# Patient Record
Sex: Male | Born: 1960 | Race: Black or African American | Hispanic: No | Marital: Single | Smoking: Former smoker
Health system: Southern US, Community
[De-identification: ages and names within clinical notes are randomized; demographics above are authoritative.]

## PROBLEM LIST (undated history)

## (undated) DIAGNOSIS — I639 Cerebral infarction, unspecified: Secondary | ICD-10-CM

## (undated) HISTORY — PX: NO PAST SURGERIES: SHX2092

---

## 2014-09-03 ENCOUNTER — Encounter (HOSPITAL_COMMUNITY): Payer: Self-pay | Admitting: Emergency Medicine

## 2014-09-03 ENCOUNTER — Emergency Department (HOSPITAL_COMMUNITY)
Admission: EM | Admit: 2014-09-03 | Discharge: 2014-09-03 | Disposition: A | Discharge status: Home / Self Care | Payer: Self-pay | Attending: Emergency Medicine | Admitting: Emergency Medicine

## 2014-09-03 ENCOUNTER — Emergency Department (HOSPITAL_COMMUNITY): Payer: Self-pay

## 2014-09-03 DIAGNOSIS — S99912A Unspecified injury of left ankle, initial encounter: Secondary | ICD-10-CM

## 2014-09-03 DIAGNOSIS — W010XXA Fall on same level from slipping, tripping and stumbling without subsequent striking against object, initial encounter: Secondary | ICD-10-CM | POA: Insufficient documentation

## 2014-09-03 DIAGNOSIS — S82402A Unspecified fracture of shaft of left fibula, initial encounter for closed fracture: Secondary | ICD-10-CM

## 2014-09-03 DIAGNOSIS — Z87891 Personal history of nicotine dependence: Secondary | ICD-10-CM | POA: Insufficient documentation

## 2014-09-03 DIAGNOSIS — Y9289 Other specified places as the place of occurrence of the external cause: Secondary | ICD-10-CM | POA: Insufficient documentation

## 2014-09-03 DIAGNOSIS — Y9301 Activity, walking, marching and hiking: Secondary | ICD-10-CM | POA: Insufficient documentation

## 2014-09-03 DIAGNOSIS — S82432A Displaced oblique fracture of shaft of left fibula, initial encounter for closed fracture: Secondary | ICD-10-CM | POA: Insufficient documentation

## 2014-09-03 MED ORDER — HYDROCODONE-ACETAMINOPHEN 5-325 MG PO TABS: 1.0000 | ORAL_TABLET | ORAL | Status: DC | PRN

## 2014-09-03 NOTE — ED Notes (Signed)
Waiting for ortho to complete splint/crutches.

## 2014-09-03 NOTE — ED Notes (Signed)
Patient states fell over something on his deck on Sunday and twisted his ankle.   Patient states swelling and pain.   Patient complains of pain,

## 2014-09-03 NOTE — Progress Notes (Signed)
Orthopedic Tech Progress Note Patient Details:  Naida SleightStephen Delaguila 05/15/1961 [id number]  Ortho Devices Type of Ortho Device: Crutches;Ace wrap;Post (short leg) splint;Stirrup splint Ortho Device/Splint Interventions: Application   Cammer, Mickie BailJennifer Carol 09/03/2014, 12:35 PM

## 2014-09-03 NOTE — Discharge Planning (Signed)
[facility] Community Health & Eligibility Specialist  Spoke to the patient regarding primary care resources and the [facility]. Orange card application and instructions provided. Resource guide and my contact information given for any future questions or concerns. No other Community Health & Eligibility Specialist needs identified at this time.

## 2014-09-03 NOTE — Discharge Instructions (Signed)
Cast or Splint Care °Casts and splints support injured limbs and keep bones from moving while they heal. It is important to care for your cast or splint at home.   °HOME CARE INSTRUCTIONS °· Keep the cast or splint uncovered during the drying period. It can take 24 to 48 hours to dry if it is made of plaster. A fiberglass cast will dry in less than 1 hour. °· Do not rest the cast on anything harder than a pillow for the first 24 hours. °· Do not put weight on your injured limb or apply pressure to the cast until your health care provider gives you permission. °· Keep the cast or splint dry. Wet casts or splints can lose their shape and may not support the limb as well. A wet cast that has lost its shape can also create harmful pressure on your skin when it dries. Also, wet skin can become infected. °¨ Cover the cast or splint with a plastic bag when bathing or when out in the rain or snow. If the cast is on the trunk of the body, take sponge baths until the cast is removed. °¨ If your cast does become wet, dry it with a towel or a blow dryer on the cool setting only. °· Keep your cast or splint clean. Soiled casts may be wiped with a moistened cloth. °· Do not place any hard or soft foreign objects under your cast or splint, such as cotton, toilet paper, lotion, or powder. °· Do not try to scratch the skin under the cast with any object. The object could get stuck inside the cast. Also, scratching could lead to an infection. If itching is a problem, use a blow dryer on a cool setting to relieve discomfort. °· Do not trim or cut your cast or remove padding from inside of it. °· Exercise all joints next to the injury that are not immobilized by the cast or splint. For example, if you have a long leg cast, exercise the hip joint and toes. If you have an arm cast or splint, exercise the shoulder, elbow, thumb, and fingers. °· Elevate your injured arm or leg on 1 or 2 pillows for the first 1 to 3 days to decrease  swelling and pain. It is best if you can comfortably elevate your cast so it is higher than your heart. °SEEK MEDICAL CARE IF:  °· Your cast or splint cracks. °· Your cast or splint is too tight or too loose. °· You have unbearable itching inside the cast. °· Your cast becomes wet or develops a soft spot or area. °· You have a bad smell coming from inside your cast. °· You get an object stuck under your cast. °· Your skin around the cast becomes red or raw. °· You have new pain or worsening pain after the cast has been applied. °SEEK IMMEDIATE MEDICAL CARE IF:  °· You have fluid leaking through the cast. °· You are unable to move your fingers or toes. °· You have discolored (blue or white), cool, painful, or very swollen fingers or toes beyond the cast. °· You have tingling or numbness around the injured area. °· You have severe pain or pressure under the cast. °· You have any difficulty with your breathing or have shortness of breath. °· You have chest pain. °Document Released: 11/05/2000 Document Revised: 08/29/2013 Document Reviewed: 05/17/2013 °ExitCare® Patient Information ©2015 [employer]. This information is not intended to replace advice given to you by your health care   provider. Make sure you discuss any questions you have with your health care provider. Tibial and Fibular Fracture, Adult Tibial and fibular fracture is a break in the bones of your lower leg (tibia and fibula). The tibia is the larger of these two bones. The fibula is the smaller of the two bones. It is on the outer side of your leg.  CAUSES  Low-energy injuries, such as a fall from ground level.  High-energy injuries, such as motor vehicle injuries, gunshot wounds, or high-speed sports collisions. RISK FACTORS  Jumping activities.  Repetitive stress, such as long-distance running.  Participation in sports.  Osteoporosis.  Advanced age. SIGNS AND SYMPTOMS  Pain.  Swelling.  Inability to put weight on your injured  leg.  Bone deformities at the site of your injury.  Bruising. DIAGNOSIS  Tibial and fibular fractures are diagnosed with the use of X-ray exams. TREATMENT  If you have a simple fracture of these two bones, they can be treated with simple immobilization. A cast or splint will be used on your leg to keep it from moving while it heals. Then you can begin range-of-motion exercises to regain your knee motion. HOME CARE INSTRUCTIONS   Apply ice to your leg:  Put ice in a plastic bag.  Place a towel between your skin and the bag.  Leave the ice on for 20 minutes, 2-3 times a day.  If you have a plaster or fiberglass cast:  Do not try to scratch the skin under the cast using sharp or pointed objects.  Check the skin around the cast every day. You may put lotion on any red or sore areas.  Keep your cast dry and clean.  If you have a plaster splint:  Wear the splint as directed.  You may loosen the elastic around the splint if your toes become numb, tingle, or turn cold or blue.  Do not put pressure on any part of your cast or splint until it is fully hardened, because it may deform.  Your cast or splint can be protected during bathing with a plastic bag. Do not lower the cast or splint into water.  Use crutches as directed.  Only take over-the-counter or prescription medicines for pain, discomfort, or fever as directed by your health care provider.  Follow all instructions given to you by your health care provider.  Make and keep all follow-up appointments. SEEK MEDICAL CARE IF:  Your pain is becoming worse rather than better or is not controlled with medicines.  You have increased swelling or redness in the foot.  You begin to lose feeling in your foot or toes. SEEK IMMEDIATE MEDICAL CARE IF:  You develop a cold or blue foot or toes on the injured side.  You develop severe pain in your injured leg, especially if the pain is increased with movement of your toes. MAKE  SURE YOU:  Understand these instructions.  Will watch your condition.  Will get help right away if you are not doing well or get worse. Document Released: 07/31/2002 Document Revised: 08/29/2013 Document Reviewed: 06/20/2013 [facility] Patient Information 2015 [place]. This information is not intended to replace advice given to you by your health care provider. Make sure you discuss any questions you have with your health care provider. Cryotherapy Cryotherapy means treatment with cold. Ice or gel packs can be used to reduce both pain and swelling. Ice is the most helpful within the first 24 to 48 hours after an injury or flare-up from overusing  a muscle or joint. Sprains, strains, spasms, burning pain, shooting pain, and aches can all be eased with ice. Ice can also be used when recovering from surgery. Ice is effective, has very few side effects, and is safe for most people to use. PRECAUTIONS  Ice is not a safe treatment option for people with:  Raynaud phenomenon. This is a condition affecting small blood vessels in the extremities. Exposure to cold may cause your problems to return.  Cold hypersensitivity. There are many forms of cold hypersensitivity, including:  Cold urticaria. Red, itchy hives appear on the skin when the tissues begin to warm after being iced.  Cold erythema. This is a red, itchy rash caused by exposure to cold.  Cold hemoglobinuria. Red blood cells break down when the tissues begin to warm after being iced. The hemoglobin that carry oxygen are passed into the urine because they cannot combine with blood proteins fast enough.  Numbness or altered sensitivity in the area being iced. If you have any of the following conditions, do not use ice until you have discussed cryotherapy with your caregiver:  Heart conditions, such as arrhythmia, angina, or chronic heart disease.  High blood pressure.  Healing wounds or open skin in the area being iced.  Current  infections.  Rheumatoid arthritis.  Poor circulation.  Diabetes. Ice slows the blood flow in the region it is applied. This is beneficial when trying to stop inflamed tissues from spreading irritating chemicals to surrounding tissues. However, if you expose your skin to cold temperatures for too long or without the proper protection, you can damage your skin or nerves. Watch for signs of skin damage due to cold. HOME CARE INSTRUCTIONS Follow these tips to use ice and cold packs safely.  Place a dry or damp towel between the ice and skin. A damp towel will cool the skin more quickly, so you may need to shorten the time that the ice is used.  For a more rapid response, add gentle compression to the ice.  Ice for no more than 10 to 20 minutes at a time. The bonier the area you are icing, the less time it will take to get the benefits of ice.  Check your skin after 5 minutes to make sure there are no signs of a poor response to cold or skin damage.  Rest 20 minutes or more between uses.  Once your skin is numb, you can end your treatment. You can test numbness by very lightly touching your skin. The touch should be so light that you do not see the skin dimple from the pressure of your fingertip. When using ice, most people will feel these normal sensations in this order: cold, burning, aching, and numbness.  Do not use ice on someone who cannot communicate their responses to pain, such as small children or people with dementia. HOW TO MAKE AN ICE PACK Ice packs are the most common way to use ice therapy. Other methods include ice massage, ice baths, and cryosprays. Muscle creams that cause a cold, tingly feeling do not offer the same benefits that ice offers and should not be used as a substitute unless recommended by your caregiver. To make an ice pack, do one of the following:  Place crushed ice or a bag of frozen vegetables in a sealable plastic bag. Squeeze out the excess air. Place this  bag inside another plastic bag. Slide the bag into a pillowcase or place a damp towel between your skin and the  bag.  Mix 3 parts water with 1 part rubbing alcohol. Freeze the mixture in a sealable plastic bag. When you remove the mixture from the freezer, it will be slushy. Squeeze out the excess air. Place this bag inside another plastic bag. Slide the bag into a pillowcase or place a damp towel between your skin and the bag. SEEK MEDICAL CARE IF:  You develop white spots on your skin. This may give the skin a blotchy (mottled) appearance.  Your skin turns blue or pale.  Your skin becomes waxy or hard.  Your swelling gets worse. MAKE SURE YOU:   Understand these instructions.  Will watch your condition.  Will get help right away if you are not doing well or get worse. Document Released: 07/05/2011 Document Revised: 03/25/2014 Document Reviewed: 07/05/2011 [facility] Patient Information 2015 [place], [place]. This information is not intended to replace advice given to you by your health care provider. Make sure you discuss any questions you have with your health care provider.

## 2014-09-03 NOTE — ED Notes (Signed)
Ortho at bedside with patient

## 2014-09-03 NOTE — ED Provider Notes (Signed)
CSN: [id number]     Arrival date & time 09/03/14  1024 History  This chart was scribed for non-physician practitioner, Arnoldo HookerShari A Kaimani Clayson, PA-C, working with Lyanne CoKevin M Campos, MD by Charline BillsEssence Howell, ED Scribe. This patient was seen in room TR05C/TR05C and the patient's care was started at 11:04 AM.   Chief Complaint  Patient presents with  . Ankle Injury   The history is provided by the patient. No language interpreter was used.   HPI Comments: Cory SleightStephen [name] is a 53 y.o. male, with no pertinent medical history, who presents to the Emergency Department complaining of L lower leg pain and L ankle pain with associated swelling onset 2 days ago. Pt states that he was walking down a wet ramp when he slipped and fell. He states that he landed on his L leg which slide to the side upon falling. Pt denies pain at this time or with sitting, but reports that pain is exacerbated with walking and lying in bed. He describes the pain as pressure. He denies fever, chills, SOB, chest pain, leg swelling. Pt has tried soaking is ankle in Epson salt, alcohol and warm water without relief. He has also tried ice, elevation and ibuprofen with mild relief. No h/o previous injury or surgery.  History reviewed. No pertinent past medical history. History reviewed. No pertinent past surgical history. No family history on file. History  Substance Use Topics  . Smoking status: Former Games developermoker  . Smokeless tobacco: Not on file  . Alcohol Use: Yes    Review of Systems  Constitutional: Negative for fever and chills.  Respiratory: Negative for shortness of breath.   Cardiovascular: Negative for chest pain.  Musculoskeletal: Positive for arthralgias and joint swelling.   Allergies  Review of patient's allergies indicates no known allergies.  Home Medications   Prior to Admission medications   Medication Sig Start Date End Date Taking? Authorizing Provider  ibuprofen (ADVIL,MOTRIN) 200 MG tablet Take 200 mg by mouth  every 6 (six) hours as needed for mild pain.   Yes Historical Provider, MD   Triage Vitals: BP 153/94  Pulse 101  Temp(Src) 98.6 F (37 C) (Oral)  Resp 16  Ht 5\' 6"  (1.676 m)  Wt 190 lb (86.183 kg)  BMI 30.68 kg/m2  SpO2 100% Physical Exam  Nursing note and vitals reviewed. Constitutional: He is oriented to person, place, and time. He appears well-developed and well-nourished.  HENT:  Head: Normocephalic and atraumatic.  Eyes: Conjunctivae and EOM are normal.  Neck: Neck supple.  Cardiovascular: Normal rate and regular rhythm.   Pulmonary/Chest: Effort normal and breath sounds normal. He has no wheezes. He has no rales.  Musculoskeletal: Normal range of motion.  L ankle: Edema medial and lateral malleolous  Tender to palpation LLE: Edema of dorsum of foot No open wounds, erythema, warmth  Full ROM Strength and sensation intact bilaterally   Neurological: He is alert and oriented to person, place, and time.  Skin: Skin is warm and dry.  Psychiatric: He has a normal mood and affect. His behavior is normal.   ED Course  Procedures (including critical care time) DIAGNOSTIC STUDIES: Oxygen Saturation is 100% on RA, normal by my interpretation.    COORDINATION OF CARE: 11:15 AM-Discussed treatment plan which includes XR with pt at bedside and pt agreed to plan.   Labs Review Labs Reviewed - No data to display  Imaging Review Dg Ankle Complete Left  09/03/2014   CLINICAL DATA:  Ankle injury 2 days ago,  pain and swelling lateral malleolus left ankle  EXAM: LEFT ANKLE COMPLETE - 3+ VIEW  COMPARISON:  None.  FINDINGS: Three views of the left ankle submitted. There is mild displaced oblique fracture in distal left fibula with adjacent soft tissue swelling. Minimal disruption of ankle mortise with mild widening of medial tibiotalar space.  IMPRESSION: There is mild displaced oblique fracture in distal left fibula with adjacent soft tissue swelling. Minimal disruption of ankle  mortise with mild widening of medial tibiotalar space.   Electronically Signed   By: Natasha MeadLiviu  Pop M.D.   On: 09/03/2014 10:58    EKG Interpretation None      MDM   Final diagnoses:  None    1. Fibula fracture 2. Ankle injury  Posterior splint w/stirrup applied, crutches for non-weight bearing status given ankle mortise instability. Pain management provided with referral to orthopedics.  I personally performed the services described in this documentation, which was scribed in my presence. The recorded information has been reviewed and is accurate.    Arnoldo HookerShari A Amaal Dimartino, PA-C 09/03/14 1202

## 2014-09-04 NOTE — ED Provider Notes (Signed)
Medical screening examination/treatment/procedure(s) were performed by non-physician practitioner and as supervising physician I was immediately available for consultation/collaboration.   EKG Interpretation None        Ephrata Verville M Jennilee Demarco, MD 09/04/14 1936 

## 2014-10-15 ENCOUNTER — Inpatient Hospital Stay (HOSPITAL_COMMUNITY)
Admission: EM | Admit: 2014-10-15 | Discharge: 2014-10-18 | DRG: 065 | Disposition: A | Discharge status: Rehabilitation Facility | Payer: Medicaid Other | Attending: Internal Medicine | Admitting: Internal Medicine

## 2014-10-15 ENCOUNTER — Emergency Department (HOSPITAL_COMMUNITY): Payer: Medicaid Other

## 2014-10-15 ENCOUNTER — Inpatient Hospital Stay (HOSPITAL_COMMUNITY): Payer: Medicaid Other

## 2014-10-15 ENCOUNTER — Encounter (HOSPITAL_COMMUNITY): Payer: Self-pay | Admitting: Emergency Medicine

## 2014-10-15 DIAGNOSIS — Z87891 Personal history of nicotine dependence: Secondary | ICD-10-CM

## 2014-10-15 DIAGNOSIS — R29810 Facial weakness: Secondary | ICD-10-CM | POA: Diagnosis present

## 2014-10-15 DIAGNOSIS — E785 Hyperlipidemia, unspecified: Secondary | ICD-10-CM | POA: Insufficient documentation

## 2014-10-15 DIAGNOSIS — E875 Hyperkalemia: Secondary | ICD-10-CM | POA: Diagnosis present

## 2014-10-15 DIAGNOSIS — F1020 Alcohol dependence, uncomplicated: Secondary | ICD-10-CM | POA: Diagnosis present

## 2014-10-15 DIAGNOSIS — I639 Cerebral infarction, unspecified: Secondary | ICD-10-CM | POA: Diagnosis present

## 2014-10-15 DIAGNOSIS — Z823 Family history of stroke: Secondary | ICD-10-CM | POA: Diagnosis not present

## 2014-10-15 DIAGNOSIS — I63419 Cerebral infarction due to embolism of unspecified middle cerebral artery: Principal | ICD-10-CM | POA: Diagnosis present

## 2014-10-15 DIAGNOSIS — R1312 Dysphagia, oropharyngeal phase: Secondary | ICD-10-CM | POA: Diagnosis present

## 2014-10-15 DIAGNOSIS — G8194 Hemiplegia, unspecified affecting left nondominant side: Secondary | ICD-10-CM | POA: Diagnosis present

## 2014-10-15 DIAGNOSIS — I10 Essential (primary) hypertension: Secondary | ICD-10-CM | POA: Diagnosis present

## 2014-10-15 DIAGNOSIS — Z9119 Patient's noncompliance with other medical treatment and regimen: Secondary | ICD-10-CM | POA: Diagnosis present

## 2014-10-15 DIAGNOSIS — R4781 Slurred speech: Secondary | ICD-10-CM | POA: Diagnosis present

## 2014-10-15 DIAGNOSIS — I517 Cardiomegaly: Secondary | ICD-10-CM

## 2014-10-15 LAB — I-STAT CHEM 8, ED
BUN: 10 mg/dL (ref 6–23)
CREATININE: 1.1 mg/dL (ref 0.50–1.35)
Calcium, Ion: 1.08 mmol/L — ABNORMAL LOW (ref 1.12–1.23)
Chloride: 104 mEq/L (ref 96–112)
Glucose, Bld: 118 mg/dL — ABNORMAL HIGH (ref 70–99)
HCT: 52 % (ref 39.0–52.0)
HEMOGLOBIN: 17.7 g/dL — AB (ref 13.0–17.0)
POTASSIUM: 5.5 meq/L — AB (ref 3.7–5.3)
SODIUM: 141 meq/L (ref 137–147)
TCO2: 28 mmol/L (ref 0–100)

## 2014-10-15 LAB — ETHANOL: Alcohol, Ethyl (B): 103 mg/dL — ABNORMAL HIGH (ref 0–11)

## 2014-10-15 LAB — RAPID URINE DRUG SCREEN, HOSP PERFORMED
AMPHETAMINES: NOT DETECTED
BENZODIAZEPINES: NOT DETECTED
Barbiturates: NOT DETECTED
COCAINE: NOT DETECTED
OPIATES: NOT DETECTED
TETRAHYDROCANNABINOL: NOT DETECTED

## 2014-10-15 LAB — LIPID PANEL
CHOL/HDL RATIO: 4.6 ratio
Cholesterol: 180 mg/dL (ref 0–200)
HDL: 39 mg/dL — AB (ref >39)
LDL CALC: 118 mg/dL — AB (ref 0–99)
Triglycerides: 115 mg/dL (ref <150)
VLDL: 23 mg/dL (ref 0–40)

## 2014-10-15 LAB — URINALYSIS, ROUTINE W REFLEX MICROSCOPIC
BILIRUBIN URINE: NEGATIVE
Glucose, UA: NEGATIVE mg/dL
HGB URINE DIPSTICK: NEGATIVE
KETONES UR: NEGATIVE mg/dL
Leukocytes, UA: NEGATIVE
Nitrite: NEGATIVE
Protein, ur: NEGATIVE mg/dL
Specific Gravity, Urine: 1.019 (ref 1.005–1.030)
UROBILINOGEN UA: 0.2 mg/dL (ref 0.0–1.0)
pH: 6 (ref 5.0–8.0)

## 2014-10-15 LAB — CBC
HEMATOCRIT: 47.3 % (ref 39.0–52.0)
Hemoglobin: 15.6 g/dL (ref 13.0–17.0)
MCH: 28.5 pg (ref 26.0–34.0)
MCHC: 33 g/dL (ref 30.0–36.0)
MCV: 86.5 fL (ref 78.0–100.0)
PLATELETS: 220 10*3/uL (ref 150–400)
RBC: 5.47 MIL/uL (ref 4.22–5.81)
RDW: 12.4 % (ref 11.5–15.5)
WBC: 5.1 10*3/uL (ref 4.0–10.5)

## 2014-10-15 LAB — COMPREHENSIVE METABOLIC PANEL
ALBUMIN: 3.6 g/dL (ref 3.5–5.2)
ALT: 13 U/L (ref 0–53)
AST: 24 U/L (ref 0–37)
Alkaline Phosphatase: 58 U/L (ref 39–117)
Anion gap: 14 (ref 5–15)
BUN: 8 mg/dL (ref 6–23)
CALCIUM: 9.1 mg/dL (ref 8.4–10.5)
CO2: 26 mEq/L (ref 19–32)
CREATININE: 0.93 mg/dL (ref 0.50–1.35)
Chloride: 101 mEq/L (ref 96–112)
GFR calc Af Amer: >90 mL/min (ref >90)
GFR calc non Af Amer: >90 mL/min (ref >90)
Glucose, Bld: 113 mg/dL — ABNORMAL HIGH (ref 70–99)
Potassium: 5.6 mEq/L — ABNORMAL HIGH (ref 3.7–5.3)
Sodium: 141 mEq/L (ref 137–147)
TOTAL PROTEIN: 8.5 g/dL — AB (ref 6.0–8.3)
Total Bilirubin: 0.3 mg/dL (ref 0.3–1.2)

## 2014-10-15 LAB — DIFFERENTIAL
BASOS PCT: 0 % (ref 0–1)
Basophils Absolute: 0 10*3/uL (ref 0.0–0.1)
EOS PCT: 3 % (ref 0–5)
Eosinophils Absolute: 0.2 10*3/uL (ref 0.0–0.7)
Lymphocytes Relative: 50 % — ABNORMAL HIGH (ref 12–46)
Lymphs Abs: 2.6 10*3/uL (ref 0.7–4.0)
MONO ABS: 0.4 10*3/uL (ref 0.1–1.0)
Monocytes Relative: 8 % (ref 3–12)
Neutro Abs: 2 10*3/uL (ref 1.7–7.7)
Neutrophils Relative %: 39 % — ABNORMAL LOW (ref 43–77)

## 2014-10-15 LAB — I-STAT TROPONIN, ED: TROPONIN I, POC: 0 ng/mL (ref 0.00–0.08)

## 2014-10-15 LAB — SEDIMENTATION RATE: SED RATE: 20 mm/h — AB (ref 0–16)

## 2014-10-15 LAB — APTT: APTT: 28 s (ref 24–37)

## 2014-10-15 LAB — GLUCOSE, CAPILLARY: Glucose-Capillary: 97 mg/dL (ref 70–99)

## 2014-10-15 LAB — CBG MONITORING, ED: Glucose-Capillary: 97 mg/dL (ref 70–99)

## 2014-10-15 LAB — HEMOGLOBIN A1C
Hgb A1c MFr Bld: 6.1 % — ABNORMAL HIGH (ref <5.7)
Mean Plasma Glucose: 128 mg/dL — ABNORMAL HIGH (ref <117)

## 2014-10-15 LAB — TSH: TSH: 0.641 u[IU]/mL (ref 0.350–4.500)

## 2014-10-15 LAB — PROTIME-INR
INR: 1.03 (ref 0.00–1.49)
Prothrombin Time: 13.7 seconds (ref 11.6–15.2)

## 2014-10-15 MED ORDER — LORAZEPAM 2 MG/ML IJ SOLN
INTRAMUSCULAR | Status: AC
  Administered 2014-10-15: 2 mg via INTRAVENOUS
  Filled 2014-10-15: qty 1

## 2014-10-15 MED ORDER — ASPIRIN 325 MG PO TABS
325.0000 mg | ORAL_TABLET | Freq: Every day | ORAL | Status: DC
  Administered 2014-10-17 – 2014-10-18 (×2): 325 mg via ORAL
  Filled 2014-10-15 (×2): qty 1

## 2014-10-15 MED ORDER — SENNOSIDES-DOCUSATE SODIUM 8.6-50 MG PO TABS: 1.0000 | ORAL_TABLET | Freq: Every evening | ORAL | Status: DC | PRN

## 2014-10-15 MED ORDER — CETYLPYRIDINIUM CHLORIDE 0.05 % MT LIQD
7.0000 mL | Freq: Two times a day (BID) | OROMUCOSAL | Status: DC
  Administered 2014-10-15 – 2014-10-18 (×6): 7 mL via OROMUCOSAL

## 2014-10-15 MED ORDER — LORAZEPAM 2 MG/ML IJ SOLN
0.0000 mg | Freq: Two times a day (BID) | INTRAMUSCULAR | Status: DC
  Administered 2014-10-18: 2 mg via INTRAVENOUS
  Filled 2014-10-15 (×2): qty 1

## 2014-10-15 MED ORDER — ASPIRIN 300 MG RE SUPP
300.0000 mg | Freq: Every day | RECTAL | Status: DC
  Administered 2014-10-15 – 2014-10-16 (×2): 300 mg via RECTAL
  Filled 2014-10-15 (×2): qty 1

## 2014-10-15 MED ORDER — FENTANYL CITRATE 0.05 MG/ML IJ SOLN
50.0000 ug | Freq: Once | INTRAMUSCULAR | Status: AC
  Administered 2014-10-15: 50 ug via INTRAVENOUS
  Filled 2014-10-15: qty 2

## 2014-10-15 MED ORDER — FOLIC ACID 1 MG PO TABS
1.0000 mg | ORAL_TABLET | Freq: Every day | ORAL | Status: DC
  Administered 2014-10-16 – 2014-10-18 (×3): 1 mg via ORAL
  Filled 2014-10-15 (×3): qty 1

## 2014-10-15 MED ORDER — FLEET ENEMA 7-19 GM/118ML RE ENEM
1.0000 | ENEMA | Freq: Once | RECTAL | Status: AC
  Administered 2014-10-15: 1 via RECTAL
  Filled 2014-10-15: qty 1

## 2014-10-15 MED ORDER — SODIUM POLYSTYRENE SULFONATE 15 GM/60ML PO SUSP
15.0000 g | Freq: Once | ORAL | Status: AC
  Administered 2014-10-15: 15 g via RECTAL
  Filled 2014-10-15: qty 60

## 2014-10-15 MED ORDER — THIAMINE HCL 100 MG/ML IJ SOLN
100.0000 mg | Freq: Every day | INTRAMUSCULAR | Status: DC
  Administered 2014-10-15 – 2014-10-16 (×2): 100 mg via INTRAVENOUS
  Filled 2014-10-15 (×2): qty 2

## 2014-10-15 MED ORDER — SODIUM CHLORIDE 0.9 % IV SOLN
INTRAVENOUS | Status: AC
  Administered 2014-10-15: 03:00:00 via INTRAVENOUS

## 2014-10-15 MED ORDER — ADULT MULTIVITAMIN W/MINERALS CH
1.0000 | ORAL_TABLET | Freq: Every day | ORAL | Status: DC
  Administered 2014-10-16 – 2014-10-18 (×3): 1 via ORAL
  Filled 2014-10-15 (×3): qty 1

## 2014-10-15 MED ORDER — ENOXAPARIN SODIUM 40 MG/0.4ML ~~LOC~~ SOLN
40.0000 mg | SUBCUTANEOUS | Status: DC
  Administered 2014-10-15 – 2014-10-18 (×3): 40 mg via SUBCUTANEOUS
  Filled 2014-10-15 (×4): qty 0.4

## 2014-10-15 MED ORDER — LORAZEPAM 2 MG/ML IJ SOLN: 0.0000 mg | Freq: Four times a day (QID) | INTRAMUSCULAR | Status: AC

## 2014-10-15 MED ORDER — LORAZEPAM 2 MG/ML IJ SOLN
1.0000 mg | Freq: Once | INTRAMUSCULAR | Status: AC | PRN
  Administered 2014-10-15: 2 mg via INTRAVENOUS

## 2014-10-15 MED ORDER — LORAZEPAM 2 MG/ML IJ SOLN
1.0000 mg | Freq: Four times a day (QID) | INTRAMUSCULAR | Status: AC | PRN
  Administered 2014-10-17: 1 mg via INTRAVENOUS

## 2014-10-15 MED ORDER — VITAMIN B-1 100 MG PO TABS
100.0000 mg | ORAL_TABLET | Freq: Every day | ORAL | Status: DC
  Administered 2014-10-18: 100 mg via ORAL
  Filled 2014-10-15: qty 1

## 2014-10-15 MED ORDER — STROKE: EARLY STAGES OF RECOVERY BOOK
Freq: Once | Status: AC
  Administered 2014-10-15: 03:00:00
  Filled 2014-10-15: qty 1

## 2014-10-15 MED ORDER — LORAZEPAM 1 MG PO TABS
1.0000 mg | ORAL_TABLET | Freq: Four times a day (QID) | ORAL | Status: AC | PRN
  Administered 2014-10-17: 1 mg via ORAL
  Filled 2014-10-15: qty 1

## 2014-10-15 MED ORDER — CHLORHEXIDINE GLUCONATE 0.12 % MT SOLN
15.0000 mL | Freq: Two times a day (BID) | OROMUCOSAL | Status: DC
  Administered 2014-10-15 – 2014-10-18 (×6): 15 mL via OROMUCOSAL
  Filled 2014-10-15 (×5): qty 15

## 2014-10-15 MED ORDER — ACETAMINOPHEN 650 MG RE SUPP
650.0000 mg | Freq: Three times a day (TID) | RECTAL | Status: DC | PRN
  Administered 2014-10-15 – 2014-10-17 (×3): 650 mg via RECTAL
  Filled 2014-10-15 (×3): qty 1

## 2014-10-15 NOTE — Progress Notes (Signed)
SLP Cancellation Note  Patient Details Name: Cory SleightStephen Macmullen MRN: [id number] DOB: 11-12-1961   Cancelled treatment:       Reason Eval/Treat Not Completed: Fatigue/lethargy limiting ability to participate. MBS not completed due to pt given sedatives for MRI and unable to sustain arousal at time of exam. Will f/u tomorrow.    Tarrah Furuta, Riley NearingBonnie Caroline 10/15/2014, 2:45 PM

## 2014-10-15 NOTE — ED Notes (Signed)
Neurology and stroke team at bedside. 

## 2014-10-15 NOTE — Progress Notes (Signed)
PATIENT DETAILS Name: Cory SleightStephen [name] Age: 53 y.o. Sex: male Date of Birth: 11-24-1960 Admit Date: 10/15/2014 Admitting Physician Eduard ClosArshad N Kakrakandy, MD PCP:No PCP Per Patient  Subjective: Speaks slowly but clear, left hemiplegia persists.  Assessment/Plan: Principal Problem:   Acute CVA (cerebrovascular accident): MCA territory infarct. EKG shows sinus rhythm. However likely probably embolic infarct. Await further workup, including 2-D echocardiogram and carotid Doppler. Await further neurology evaluation. LDL elevated at 118, will start statins once oral intake resumes. A1c is pending. Continue aspirin suppository for now. Unfortunately, continues to have left hemiplegia, left facial droop.  Active Problems:   Alcohol abuse: Patient claims he drinks a fair amount of alcohol-beer, liquor and block,. Last drink on 11/23. Currently awake, alert and without any tremors. Continue Ativan per CIWA protocol. High risk for progression to delirium tremens.    Mild hyperkalemia: Unknown etiology, will give Kayexalate suppository, and recheck electrolytes in a.m.  Disposition: Remain inpatient  Antibiotics:  Anti-infectives    None      DVT Prophylaxis: Prophylactic Lovenox   Code Status: Full code   Family Communication None at bedside  Procedures:  None  CONSULTS:  neurology  Time spent 40 minutes-which includes 50% of the time with face-to-face with patient/ family and coordinating care related to the above assessment and plan.  MEDICATIONS: Scheduled Meds: . aspirin  300 mg Rectal Daily   Or  . aspirin  325 mg Oral Daily  . enoxaparin (LOVENOX) injection  40 mg Subcutaneous Q24H  . folic acid  1 mg Oral Daily  . LORazepam  0-4 mg Intravenous Q6H   Followed by  . [START ON 10/17/2014] LORazepam  0-4 mg Intravenous Q12H  . multivitamin with minerals  1 tablet Oral Daily  . thiamine  100 mg Oral Daily   Or  . thiamine  100 mg Intravenous Daily    Continuous Infusions: . sodium chloride 100 mL/hr at 10/15/14 0301   PRN Meds:.acetaminophen, LORazepam **OR** LORazepam, senna-docusate    PHYSICAL EXAM: Vital signs in last 24 hours: Filed Vitals:   10/15/14 0226 10/15/14 0424 10/15/14 0625 10/15/14 0832  BP: 139/107 143/103 156/107 162/108  Pulse: 58 95 89 71  Temp: 98.6 F (37 C) 97.8 F (36.6 C) 98.2 F (36.8 C) 98.4 F (36.9 C)  TempSrc: Oral Oral Oral Oral  Resp: 18 18 18 18   Height: 5\' 8"  (1.727 m)     Weight: 79.289 kg (174 lb 12.8 oz)     SpO2: 98% 99% 98% 99%    Weight change:  Filed Weights   10/15/14 0035 10/15/14 0226  Weight: 81.647 kg (180 lb) 79.289 kg (174 lb 12.8 oz)   Body mass index is 26.58 kg/(m^2).   Gen Exam: Awake and alert with clear but slow speech.  Left facial droop. Pooling of secretions at the angle of the mouth Neck: Supple, No JVD.   Chest: B/L Clear.   CVS: S1 S2 Regular, no murmurs.  Abdomen: soft, BS +, non tender, non distended.  Extremities: no edema, lower extremities warm to touch. Neurologic: Left sided hemiplegia. Skin: No Rash.   Wounds: N/A.   Intake/Output from previous day: No intake or output data in the 24 hours ending 10/15/14 1121   LAB RESULTS: CBC  Recent Labs Lab 10/15/14 0044 10/15/14 0050  WBC 5.1  --   HGB 15.6 17.7*  HCT 47.3 52.0  PLT 220  --   MCV 86.5  --   MCH  28.5  --   MCHC 33.0  --   RDW 12.4  --   LYMPHSABS 2.6  --   MONOABS 0.4  --   EOSABS 0.2  --   BASOSABS 0.0  --     Chemistries   Recent Labs Lab 10/15/14 0044 10/15/14 0050  NA 141 141  K 5.6* 5.5*  CL 101 104  CO2 26  --   GLUCOSE 113* 118*  BUN 8 10  CREATININE 0.93 1.10  CALCIUM 9.1  --     CBG:  Recent Labs Lab 10/15/14 0043  GLUCAP 97    GFR Estimated Creatinine Clearance: 73.4 mL/min (by C-G formula based on Cr of 1.1).  Coagulation profile  Recent Labs Lab 10/15/14 0044  INR 1.03    Cardiac Enzymes No results for input(s): CKMB,  TROPONINI, MYOGLOBIN in the last 168 hours.  Invalid input(s): CK  Invalid input(s): POCBNP No results for input(s): DDIMER in the last 72 hours. No results for input(s): HGBA1C in the last 72 hours.  Recent Labs  10/15/14 0044  CHOL 180  HDL 39*  LDLCALC 118*  TRIG 115  CHOLHDL 4.6    Recent Labs  10/15/14 0620  TSH 0.641   No results for input(s): VITAMINB12, FOLATE, FERRITIN, TIBC, IRON, RETICCTPCT in the last 72 hours. No results for input(s): LIPASE, AMYLASE in the last 72 hours.  Urine Studies No results for input(s): UHGB, CRYS in the last 72 hours.  Invalid input(s): UACOL, UAPR, USPG, UPH, UTP, UGL, UKET, UBIL, UNIT, UROB, ULEU, UEPI, UWBC, URBC, UBAC, CAST, UCOM, BILUA  MICROBIOLOGY: No results found for this or any previous visit (from the past 240 hour(s)).  RADIOLOGY STUDIES/RESULTS: Ct Head Wo Contrast  10/15/2014   CLINICAL DATA:  Left-sided weakness.  Code stroke.  EXAM: CT HEAD WITHOUT CONTRAST  TECHNIQUE: Contiguous axial images were obtained from the base of the skull through the vertex without intravenous contrast.  COMPARISON:  None.  FINDINGS: Skull and Sinuses:Negative for fracture or destructive process. The mastoids, middle ears, and imaged paranasal sinuses are clear.  Orbits: No acute abnormality.  Brain: Small area of low-density within the high and posterior right frontal cortex and subcortical white matter. In the same region, there is patchy low-density within the deep cerebral white matter. Focal low-density expansion of the right caudate nucleus (anterior body); the upper right putamen is difficult to visualize. Gray-white differentiation is somewhat blurred in the right lateral temporal lobe, but this finding is indeterminate due to image noise. No hemorrhage, hydrocephalus, or shift.  Critical Value/emergent results were called by telephone at the time of interpretation on 10/15/2014 at 1:15 am to Dr. Roseanne RenoStewart, who verbally acknowledged these  results.  IMPRESSION: 1. Ischemic changes in the right MCA territory affecting the frontal cortex, anterior corona radiata, and dorsal striatum. 2. No acute intracranial hemorrhage.   Electronically Signed   By: Tiburcio PeaJonathan  Watts M.D.   On: 10/15/2014 01:22   Dg Chest Port 1 View  10/15/2014   CLINICAL DATA:  Acute right MCA distribution stroke.  EXAM: PORTABLE CHEST - 1 VIEW  COMPARISON:  None.  FINDINGS: Suboptimal inspiration accounts for crowded bronchovascular markings, especially in the bases, and accentuates the cardiac silhouette. Taking this into account, cardiomediastinal silhouette unremarkable. Lungs clear. Bronchovascular markings normal. Pulmonary vascularity normal. No visible pleural effusions. No pneumothorax.  IMPRESSION: Suboptimal inspiration.  No acute cardiopulmonary disease.   Electronically Signed   By: Hulan Saashomas  Lawrence M.D.   On: 10/15/2014 07:52  Jeoffrey Massed, MD  [facility] Pager:[phone]  If 7PM-7AM, please contact night-coverage [email or web address] Password [email or web address] 10/15/2014, 11:21 AM   LOS: 0 days

## 2014-10-15 NOTE — Progress Notes (Signed)
  Echocardiogram 2D Echocardiogram has been performed.  Cory [name], Cory [name] 10/15/2014, 9:49 AM

## 2014-10-15 NOTE — H&P (Signed)
[facility] History and Physical  Cory SleightStephen Mashaw ZOX:[id number] DOB: 1961/08/15 DOA: 10/15/2014  Referring physician: ER physician. PCP: No PCP Per Patient   Chief Complaint: Left facial droop and left-sided hemiplegia.  HPI: Cory [name] is a 53 y.o. male with history of alcoholism was brought to the ER the patient's partner noticed that patient was somnolent and more than usual at around last afternoon. EMS was called and patient was found to have left facial droop and left-sided hemiplegia. In the ER CT head was done which showed features consistent for CVA on the right MCA territory. On call neurologist Dr. Noel Christmasharles Stewart was consulted and at this time patient has been admitted for further workup for stroke. Patient on exam denies any chest pain shortness of breath nausea vomiting any visual symptoms. Patient does have weakness of the left upper and lower extremity. Left facial droop on exam and also slurred speech. Patient states he drinks alcohol everyday. Denies any drug abuse.   Review of Systems: As presented in the history of presenting illness, rest negative.  Past Medical History  Diagnosis Date  . Medical history non-contributory    Past Surgical History  Procedure Laterality Date  . No past surgeries     Social History:  reports that he has quit smoking. He does not have any smokeless tobacco history on file. He reports that he drinks alcohol. He reports that he does not use illicit drugs. Where does patient live home. Can patient participate in ADLs? Yes.  No Known Allergies  Family History:  Family History  Problem Relation Age of Onset  . Family history unknown: Yes      Prior to Admission medications   Medication Sig Start Date End Date Taking? Authorizing Provider  HYDROcodone-acetaminophen (NORCO/VICODIN) 5-325 MG per tablet Take 1-2 tablets by mouth every 4 (four) hours as needed. 09/03/14   Shari A Upstill, PA-C  ibuprofen (ADVIL,MOTRIN) 200  MG tablet Take 200 mg by mouth every 6 (six) hours as needed for mild pain.    Historical Provider, MD    Physical Exam: Filed Vitals:   10/15/14 0100 10/15/14 0121 10/15/14 0145 10/15/14 0226  BP: 149/103 146/107 136/104 139/107  Pulse: 73 89 90 58  Temp:    98.6 F (37 C)  TempSrc:    Oral  Resp: 15  25 18   Height:    5\' 8"  (1.727 m)  Weight:    79.289 kg (174 lb 12.8 oz)  SpO2: 96% 96% 99% 98%     General:  Well built and nourished.  Eyes: Anicteric no pallor.  ENT: No discharge from the ears eyes nose more. Left facial droop present.  Neck: No neck rigidity no mass felt.  Cardiovascular: S1 and S2 heard.  Respiratory: No rhonchi or crepitations.  Abdomen: Soft nontender bowel sounds present.  Skin: No rash.  Musculoskeletal: No edema.  Psychiatric: Patient appears normal.  Neurologic: Alert awake oriented to time place and person. Patient has left-sided hemiplegia with left facial droop. PERRLA positive. Tongue mildly deviated to the left.  Labs on Admission:  Basic Metabolic Panel:  Recent Labs Lab 10/15/14 0044 10/15/14 0050  NA 141 141  K 5.6* 5.5*  CL 101 104  CO2 26  --   GLUCOSE 113* 118*  BUN 8 10  CREATININE 0.93 1.10  CALCIUM 9.1  --    Liver Function Tests:  Recent Labs Lab 10/15/14 0044  AST 24  ALT 13  ALKPHOS 58  BILITOT 0.3  PROT 8.5*  ALBUMIN 3.6   No results for input(s): LIPASE, AMYLASE in the last 168 hours. No results for input(s): AMMONIA in the last 168 hours. CBC:  Recent Labs Lab 10/15/14 0044 10/15/14 0050  WBC 5.1  --   NEUTROABS 2.0  --   HGB 15.6 17.7*  HCT 47.3 52.0  MCV 86.5  --   PLT 220  --    Cardiac Enzymes: No results for input(s): CKTOTAL, CKMB, CKMBINDEX, TROPONINI in the last 168 hours.  BNP (last 3 results) No results for input(s): PROBNP in the last 8760 hours. CBG:  Recent Labs Lab 10/15/14 0043  GLUCAP 97    Radiological Exams on Admission: Ct Head Wo Contrast  10/15/2014    CLINICAL DATA:  Left-sided weakness.  Code stroke.  EXAM: CT HEAD WITHOUT CONTRAST  TECHNIQUE: Contiguous axial images were obtained from the base of the skull through the vertex without intravenous contrast.  COMPARISON:  None.  FINDINGS: Skull and Sinuses:Negative for fracture or destructive process. The mastoids, middle ears, and imaged paranasal sinuses are clear.  Orbits: No acute abnormality.  Brain: Small area of low-density within the high and posterior right frontal cortex and subcortical white matter. In the same region, there is patchy low-density within the deep cerebral white matter. Focal low-density expansion of the right caudate nucleus (anterior body); the upper right putamen is difficult to visualize. Gray-white differentiation is somewhat blurred in the right lateral temporal lobe, but this finding is indeterminate due to image noise. No hemorrhage, hydrocephalus, or shift.  Critical Value/emergent results were called by telephone at the time of interpretation on 10/15/2014 at 1:15 am to Dr. Roseanne RenoStewart, who verbally acknowledged these results.  IMPRESSION: 1. Ischemic changes in the right MCA territory affecting the frontal cortex, anterior corona radiata, and dorsal striatum. 2. No acute intracranial hemorrhage.   Electronically Signed   By: Tiburcio PeaJonathan  Watts M.D.   On: 10/15/2014 01:22    EKG: Independently reviewed. Normal sinus rhythm with nonspecific ST changes.  Assessment/Plan Principal Problem:   Acute CVA (cerebrovascular accident) Active Problems:   Stroke   Alcoholism   1. Acute CVA involving the right MCA territory with left-sided hemiplegia and left facial droop - neurologist as this time as felt that patient is not a candidate for TPA. Patient has been placed on aspirin. And further stroke workup including MRI/MRA brain 2-D echo and carotid Dopplers has been ordered. Patient has been placed on neurochecks and swallow evaluation. Get physical therapy consult. Check lipid panel  and hemoglobin A1c. 2. Alcohol abuse - patient has been placed on alcohol withdrawal protocol using Ativan and thiamine.  Chest x-ray is pending.    Code Status: Full code.  Family Communication: None.  Disposition Plan: Admit to inpatient.    KAKRAKANDY,ARSHAD N. [facility] Pager [phone].  If 7PM-7AM, please contact night-coverage [email or web address] Password [email or web address] 10/15/2014, 2:32 AM

## 2014-10-15 NOTE — ED Notes (Signed)
Per ems-- pt wife noticed L sided paralysis and drooling on self. LSN this morning. Wife noticed at 3pm he was sleeping on and off. No sensation to L side. A&ox4. Pt drinks alcohol daily. 12 lead showed elevation in v2, v3. Depression in v3, avf. cbg 130. bp 160/100, hr-90.

## 2014-10-15 NOTE — Progress Notes (Signed)
Attempted MRI as ordered. Pt given ativan IV prior to exam. Pt confused and unable to cooperate for exam. Were able to obtain limited brain exam. Were not able to get MRA of head.

## 2014-10-15 NOTE — Consult Note (Signed)
Referring Physician: Berlinda LastYELVERTON, D    Chief Complaint: New onset slurred speech, left facial droop and left-sided weakness.  HPI: Cory SleightStephen Ricotta is an 53 y.o. male history of hypertension and hyperlipidemia with noncompliance with treatment, presenting with new onset slurred speech, left facial droop and left hemiplegia. She has no previous history of stroke nor TIA. He was last seen well by his wife on the morning of 10/14/2014. She noticed that he was sleeping more than usual this afternoon. Patient is known to drink alcohol heavily. He was brought to the emergency room by EMS and code stroke was activated. CT scan of his head showed changes involving the right MCA territory consistent with large acute ischemic infarction. NIH stroke score was 16.  LSN: Morning of 10/14/2014 tPA Given: No: Beyond time under for treatment consideration, and ischemic changes on CT scan. mRankin:  History reviewed. No pertinent past medical history.  Family history: Positive for stroke involving mother as well as sister.  Medications: I have reviewed the patient's current medications.  ROS: History obtained from spouse and the patient  General ROS: negative for - chills, fatigue, fever, night sweats, weight gain or weight loss Psychological ROS: negative for - behavioral disorder, hallucinations, memory difficulties, mood swings or suicidal ideation Ophthalmic ROS: negative for - blurry vision, double vision, eye pain or loss of vision ENT ROS: negative for - epistaxis, nasal discharge, oral lesions, sore throat, tinnitus or vertigo Allergy and Immunology ROS: negative for - hives or itchy/watery eyes Hematological and Lymphatic ROS: negative for - bleeding problems, bruising or swollen lymph nodes Endocrine ROS: negative for - galactorrhea, hair pattern changes, polydipsia/polyuria or temperature intolerance Respiratory ROS: negative for - cough, hemoptysis, shortness of breath or wheezing Cardiovascular  ROS: negative for - chest pain, dyspnea on exertion, edema or irregular heartbeat Gastrointestinal ROS: negative for - abdominal pain, diarrhea, hematemesis, nausea/vomiting or stool incontinence Genito-Urinary ROS: negative for - dysuria, hematuria, incontinence or urinary frequency/urgency Musculoskeletal ROS: negative for - joint swelling or muscular weakness Neurological ROS: as noted in HPI Dermatological ROS: negative for rash and skin lesion changes  Physical Examination: Blood pressure 152/93, pulse 80, temperature 97.5 F (36.4 C), temperature source Oral, resp. rate 20, height 5\' 7"  (1.702 m), weight 81.647 kg (180 lb), SpO2 96 %.  Neurologic Examination: Mental Status: Alert, oriented, no acute distress.  Speech moderately slurred without evidence of aphasia. Able to follow commands with use of right extremities. Cranial Nerves: II- dense left homonymous hemianopsia. III/IV/VI-Pupils were equal and reacted to light. Extraocular movements were full and conjugate, with gaze preference to the right side.    V/VII-no facial numbness; moderate left lower facial weakness. VIII-normal. X-moderate dysarthria. Motor: Flaccid left hemiplegia; normal strength and muscle tone of right extremities. Deep Tendon Reflexes: 2+ and symmetric. Plantars: Mute bilaterally Cerebellar: Normal finger-to-nose testing with use of right upper extremity.  No results found.  Assessment: 53 y.o. male presenting with acute right MCA territory large ischemic infarction.  Stroke Risk Factors - family history, hyperlipidemia and hypertension  Plan: 1. HgbA1c, fasting lipid panel 2. MRI, MRA  of the brain without contrast 3. PT consult, OT consult, Speech consult 4. Echocardiogram 5. Carotid dopplers 6. Prophylactic therapy-Antiplatelet med: Aspirin 325 mg per day for 300 mg per rectum 7. Risk factor modification 8. Telemetry monitoring   C.R. Roseanne RenoStewart, MD [facility] [phone]  10/15/2014, 1:20 AM

## 2014-10-15 NOTE — Progress Notes (Signed)
Patient arrived to 4N21 Ax2. Tele placed and vital signs taken. Patient was drowsy but able to follow commands. Will continue to monitor. Bob Eastwood, [place] ScrapeSarah E

## 2014-10-15 NOTE — Progress Notes (Signed)
OT Cancellation Note  Patient Details Name: Cory [name] MRN: [id number] DOB: [birth year]   Cancelled Treatment:    Reason Eval/Treat Not Completed: Patient at procedure or test/ unavailable (Vascular; pt also given Ativan and is very lethargic per RN)  Nils PyleBermel, Reianna Batdorf 10/15/2014, 1:29 PM

## 2014-10-15 NOTE — ED Notes (Signed)
Pt sts he is supposed to take bp medications but he does not take it.

## 2014-10-15 NOTE — Progress Notes (Signed)
    [facility] has been requested to perform a transesophageal echocardiogram on 10/16/14 for source of embolic stroke.  After careful review of history and examination, the risks and benefits of transesophageal echocardiogram have been explained including risks of esophageal damage, perforation (1:10,000 risk), bleeding, pharyngeal hematoma as well as other potential complications associated with conscious sedation including aspiration, arrhythmia, respiratory failure and death. Alternatives to treatment were discussed, questions were answered. Patient is willing to proceed.  Also discussed with his sister [name] [phone]. Another number [phone].  She is not POA but his sister.  He should be tested by speech prior to study.  Cory [name] R, NP 10/15/2014 3:12 PM

## 2014-10-15 NOTE — Progress Notes (Signed)
*  PRELIMINARY RESULTS* Vascular Ultrasound Carotid Duplex (Doppler) has been completed.  Study was technically limited and difficult due to poor patient cooperation. Findings suggest 1-39% right internal carotid artery stenosis. Unable to evaluate the left internal carotid artery due to poor patient cooperation. Unable to visualize bilateral vertebral arteries.  10/15/2014 1:54 PM Gertie FeyMichelle Tarrie Mcmichen, RVT, RDCS, RDMS

## 2014-10-15 NOTE — ED Provider Notes (Signed)
CSN: [id number]     Arrival date & time 10/15/14  0031 History  This chart was scribed for Loren Raceravid Shalimar Mcclain, MD by Evon Slackerrance Branch, ED Scribe. This patient was seen in room [facility] and the patient's care was started at 12:32 AM.    No chief complaint on file.  The history is provided by the EMS personnel. No language interpreter was used.   HPI Comments: Cory [name] is a 53 y.o. male brought in by ambulance, who presents to the Emergency Department complaining of stroke symptoms onset tonight PTA. Ems states that he has left sided paralysis of unclear onset. Ems states that the wife last saw him normal yesterday morning. He states that he last felt normal today when he woke up. Pt states that he has a headache. Ems states that he has associated left sided facial droop and slurred speech. Ems states that his wife noticed that today he was intermittently sleeping since about 3 PM today. Pt denies head injury or fall.   Pt also present with an ankle brace on his left ankle. He states that he has a Hx of ankle fracture.     History reviewed. No pertinent past medical history. History reviewed. No pertinent past surgical history. No family history on file. History  Substance Use Topics  . Smoking status: Former Games developermoker  . Smokeless tobacco: Not on file  . Alcohol Use: Yes    Review of Systems  Constitutional: Negative for fever and chills.  Respiratory: Negative for shortness of breath.   Cardiovascular: Negative for chest pain.  Gastrointestinal: Negative for nausea, vomiting and abdominal pain.  Skin: Negative for wound.  Neurological: Positive for weakness, numbness and headaches.  All other systems reviewed and are negative.     Allergies  Review of patient's allergies indicates no known allergies.  Home Medications   Prior to Admission medications   Medication Sig Start Date End Date Taking? Authorizing Provider  HYDROcodone-acetaminophen (NORCO/VICODIN) 5-325 MG per  tablet Take 1-2 tablets by mouth every 4 (four) hours as needed. 09/03/14   Shari A Upstill, PA-C  ibuprofen (ADVIL,MOTRIN) 200 MG tablet Take 200 mg by mouth every 6 (six) hours as needed for mild pain.    Historical Provider, MD   BP 146/107 mmHg  Pulse 89  Temp(Src) 97.5 F (36.4 C) (Oral)  Resp 15  Ht 5\' 7"  (1.702 m)  Wt 180 lb (81.647 kg)  BMI 28.19 kg/m2  SpO2 96%   Physical Exam  Constitutional: He is oriented to person, place, and time. He appears well-developed and well-nourished. No distress.  HENT:  Head: Normocephalic and atraumatic.  Mouth/Throat: Oropharynx is clear and moist.  Left facial droop  Eyes: EOM are normal. Pupils are equal, round, and reactive to light.  Neck: Normal range of motion. Neck supple.  No posterior midline cervical tenderness to palpation.  Cardiovascular: Normal rate and regular rhythm.   Pulmonary/Chest: Effort normal and breath sounds normal. No respiratory distress. He has no wheezes. He has no rales.  Abdominal: Soft. Bowel sounds are normal. He exhibits no distension and no mass. There is no tenderness. There is no rebound and no guarding.  Musculoskeletal: Normal range of motion. He exhibits no edema or tenderness.  Neurological: He is alert and oriented to person, place, and time.  5/5 motor in right upper and right lower extremities. 0/5 motor in left upper and left lower extremities. Decreased sensation to left extremities. Left sided lower facial droop.  Skin: Skin is warm and dry.  No rash noted. No erythema.  Psychiatric: He has a normal mood and affect. His behavior is normal.  Nursing note and vitals reviewed.   ED Course  Procedures (including critical care time)  Labs Review Labs Reviewed  ETHANOL - Abnormal; Notable for the following:    Alcohol, Ethyl (B) 103 (*)    All other components within normal limits  DIFFERENTIAL - Abnormal; Notable for the following:    Neutrophils Relative % 39 (*)    Lymphocytes Relative 50  (*)    All other components within normal limits  COMPREHENSIVE METABOLIC PANEL - Abnormal; Notable for the following:    Potassium 5.6 (*)    Glucose, Bld 113 (*)    Total Protein 8.5 (*)    All other components within normal limits  I-STAT CHEM 8, ED - Abnormal; Notable for the following:    Potassium 5.5 (*)    Glucose, Bld 118 (*)    Calcium, Ion 1.08 (*)    Hemoglobin 17.7 (*)    All other components within normal limits  PROTIME-INR  APTT  CBC  URINALYSIS, ROUTINE W REFLEX MICROSCOPIC  URINE RAPID DRUG SCREEN (HOSP PERFORMED)  I-STAT TROPOININ, ED  I-STAT TROPOININ, ED  CBG MONITORING, ED    Imaging Review Ct Head Wo Contrast  10/15/2014   CLINICAL DATA:  Left-sided weakness.  Code stroke.  EXAM: CT HEAD WITHOUT CONTRAST  TECHNIQUE: Contiguous axial images were obtained from the base of the skull through the vertex without intravenous contrast.  COMPARISON:  None.  FINDINGS: Skull and Sinuses:Negative for fracture or destructive process. The mastoids, middle ears, and imaged paranasal sinuses are clear.  Orbits: No acute abnormality.  Brain: Small area of low-density within the high and posterior right frontal cortex and subcortical white matter. In the same region, there is patchy low-density within the deep cerebral white matter. Focal low-density expansion of the right caudate nucleus (anterior body); the upper right putamen is difficult to visualize. Gray-white differentiation is somewhat blurred in the right lateral temporal lobe, but this finding is indeterminate due to image noise. No hemorrhage, hydrocephalus, or shift.  Critical Value/emergent results were called by telephone at the time of interpretation on 10/15/2014 at 1:15 am to Dr. Roseanne RenoStewart, who verbally acknowledged these results.  IMPRESSION: 1. Ischemic changes in the right MCA territory affecting the frontal cortex, anterior corona radiata, and dorsal striatum. 2. No acute intracranial hemorrhage.   Electronically  Signed   By: Tiburcio PeaJonathan  Watts M.D.   On: 10/15/2014 01:22     EKG Interpretation None      Date: 10/15/2014  Rate:80  Rhythm: normal sinus rhythm  QRS Axis: normal  Intervals: normal  ST/T Wave abnormalities: nonspecific T wave changes and early repolarization  Conduction Disutrbances:none  Narrative Interpretation:   Old EKG Reviewed: none available   MDM   Final diagnoses:  Acute CVA (cerebrovascular accident)    I personally performed the services described in this documentation, which was scribed in my presence. The recorded information has been reviewed and is accurate.  Code stroke initially activated due to unknown onset of symptoms. Dr. Roseanne RenoStewart at bedside. On further questioning patient was symptomatic days. Best case scenario patient's symptoms started around 3 PM yesterday afternoon. He subsequently went for TPA. Dr. Roseanne RenoStewart recommends admission to triad service.   Dr Toniann FailKakrakandy will admit to tele bed  Loren Raceravid Euan Wandler, MD 10/15/14 [phone]

## 2014-10-15 NOTE — Evaluation (Signed)
Clinical/Bedside Swallow Evaluation Patient Details  Name: Cory [name] MRN: [id number] Date of Birth: Nov 17, 1961  Today's Date: 10/15/2014 Time: 0748-0800 SLP Time Calculation (min) (ACUTE ONLY): 12 min  Past Medical History:  Past Medical History  Diagnosis Date  . Medical history non-contributory    Past Surgical History:  Past Surgical History  Procedure Laterality Date  . No past surgeries     HPI:  53 y.o. male with history of alcoholism was brought to the ER with left facial and left-sided hemiplegia. CT head showed features consistent for CVA on the right MCA territory. CXR Suboptimal inspiration. No acute cardiopulmonary disease.   Assessment / Plan / Recommendation Clinical Impression  Decreased saliva management with left sided leakage from oral cavity.  Poor oral control/containment, decreased airway protection and delayed swallow initiation suspected given immediate and consistent cough with cup sips water.  Objective assessment recommended to determine safest solid and liquids in addition to compensatory strategies if appropriate.  MBS scheduled today at 11:30.      Aspiration Risk  Moderate    Diet Recommendation NPO        Other  Recommendations Recommended Consults: MBS Oral Care Recommendations: Oral care BID   Follow Up Recommendations   (TBD)    Frequency and Duration        Pertinent Vitals/Pain No indications pain         Swallow Study          Oral/Motor/Sensory Function Overall Oral Motor/Sensory Function: Impaired Labial ROM: Reduced left Labial Symmetry: Abnormal symmetry left Labial Strength: Reduced Lingual ROM:  (to be assessed) Lingual Symmetry:  (to be assessed) Lingual Strength: Reduced Facial ROM: Reduced left Mandible: Within Functional Limits   Ice Chips Ice chips: Not tested   Thin Liquid Thin Liquid: Impaired Presentation: Cup Oral Phase Impairments: Reduced labial seal;Reduced lingual movement/coordination  (decreased cohesion) Oral Phase Functional Implications: Left anterior spillage Pharyngeal  Phase Impairments: Suspected delayed Swallow;Cough - Immediate    Nectar Thick Nectar Thick Liquid: Not tested   Honey Thick Honey Thick Liquid: Not tested   Puree Puree: Within functional limits   Solid   GO    Solid: Not tested       Cory [name], Cory [name] 10/15/2014,11:01 AM   Cory [name] Cory [name]

## 2014-10-15 NOTE — Progress Notes (Signed)
OT Cancellation Note  Patient Details Name: Cory [name] MRN: [id number] DOB: 1961-08-24   Cancelled Treatment:    Reason Eval/Treat Not Completed: Patient at procedure or test/ unavailable (Echo in room )  Harolyn RutherfordJones, Santo Zahradnik B  Pager: [phone]  10/15/2014, 9:18 AM

## 2014-10-15 NOTE — Progress Notes (Signed)
STROKE TEAM PROGRESS NOTE   HISTORY Cory [name] is an 53 y.o. male history of hypertension and hyperlipidemia with noncompliance with treatment, presenting with new onset slurred speech, left facial droop and left hemiplegia. She has no previous history of stroke nor TIA. He was last seen well by his girlfriend on the morning of 10/14/2014. She noticed that he was sleeping more than usual this afternoon. Patient is known to drink alcohol heavily. He was brought to the emergency room by EMS and code stroke was activated. CT scan of his head showed changes involving the right MCA territory consistent with large acute ischemic infarction. NIH stroke score was 16. Patient was not administered TPA secondary to beyond time under for treatment consideration, and ischemic changes on CT scan. He was admitted for further evaluation and treatment.   SUBJECTIVE (INTERVAL HISTORY) Patient is currently just coming out of MRI - Dr. Leonie Man examined him there. Overall he feels his condition is stable. He has limited memory of yesterday's events. He denies having a wife, but has a girlfriend. Message left for her to call Dr. Leonie Man.    OBJECTIVE Temp:  [97.5 F (36.4 C)-98.6 F (37 C)] 98.4 F (36.9 C) (11/24 0832) Pulse Rate:  [58-95] 71 (11/24 0832) Cardiac Rhythm:  [-] Sinus bradycardia (11/24 0230) Resp:  [14-25] 18 (11/24 0832) BP: (136-162)/(93-109) 162/108 mmHg (11/24 0832) SpO2:  [96 %-99 %] 99 % (11/24 0832) Weight:  [79.289 kg (174 lb 12.8 oz)-81.647 kg (180 lb)] 79.289 kg (174 lb 12.8 oz) (11/24 0226)   Recent Labs Lab 10/15/14 0043  GLUCAP 97    Recent Labs Lab 10/15/14 0044 10/15/14 0050  NA 141 141  K 5.6* 5.5*  CL 101 104  CO2 26  --   GLUCOSE 113* 118*  BUN 8 10  CREATININE 0.93 1.10  CALCIUM 9.1  --     Recent Labs Lab 10/15/14 0044  AST 24  ALT 13  ALKPHOS 58  BILITOT 0.3  PROT 8.5*  ALBUMIN 3.6    Recent Labs Lab 10/15/14 0044 10/15/14 0050  WBC 5.1  --    NEUTROABS 2.0  --   HGB 15.6 17.7*  HCT 47.3 52.0  MCV 86.5  --   PLT 220  --    No results for input(s): CKTOTAL, CKMB, CKMBINDEX, TROPONINI in the last 168 hours.  Recent Labs  10/15/14 0044  LABPROT 13.7  INR 1.03    Recent Labs  10/15/14 0056  COLORURINE YELLOW  LABSPEC 1.019  PHURINE 6.0  GLUCOSEU NEGATIVE  HGBUR NEGATIVE  BILIRUBINUR NEGATIVE  KETONESUR NEGATIVE  PROTEINUR NEGATIVE  UROBILINOGEN 0.2  NITRITE NEGATIVE  LEUKOCYTESUR NEGATIVE       Component Value Date/Time   CHOL 180 10/15/2014 0044   TRIG 115 10/15/2014 0044   HDL 39* 10/15/2014 0044   CHOLHDL 4.6 10/15/2014 0044   VLDL 23 10/15/2014 0044   LDLCALC 118* 10/15/2014 0044   No results found for: HGBA1C    Component Value Date/Time   LABOPIA NONE DETECTED 10/15/2014 0056   COCAINSCRNUR NONE DETECTED 10/15/2014 0056   LABBENZ NONE DETECTED 10/15/2014 0056   AMPHETMU NONE DETECTED 10/15/2014 0056   THCU NONE DETECTED 10/15/2014 0056   LABBARB NONE DETECTED 10/15/2014 0056     Recent Labs Lab 10/15/14 0044  ETH 103*    Ct Head Wo Contrast  10/15/2014   CLINICAL DATA:  Left-sided weakness.  Code stroke.  EXAM: CT HEAD WITHOUT CONTRAST  TECHNIQUE: Contiguous axial images were obtained  from the base of the skull through the vertex without intravenous contrast.  COMPARISON:  None.  FINDINGS: Skull and Sinuses:Negative for fracture or destructive process. The mastoids, middle ears, and imaged paranasal sinuses are clear.  Orbits: No acute abnormality.  Brain: Small area of low-density within the high and posterior right frontal cortex and subcortical white matter. In the same region, there is patchy low-density within the deep cerebral white matter. Focal low-density expansion of the right caudate nucleus (anterior body); the upper right putamen is difficult to visualize. Gray-white differentiation is somewhat blurred in the right lateral temporal lobe, but this finding is indeterminate due to  image noise. No hemorrhage, hydrocephalus, or shift.  Critical Value/emergent results were called by telephone at the time of interpretation on 10/15/2014 at 1:15 am to Dr. Nicole Kindred, who verbally acknowledged these results.  IMPRESSION: 1. Ischemic changes in the right MCA territory affecting the frontal cortex, anterior corona radiata, and dorsal striatum. 2. No acute intracranial hemorrhage.   Electronically Signed   By: Jorje Guild M.D.   On: 10/15/2014 01:22   Mr Brain Wo Contrast  10/15/2014   CLINICAL DATA:  53 year old male with left facial droop and left hemiplegia. History of alcoholism. Initial encounter.  EXAM: MRI HEAD WITHOUT CONTRAST  TECHNIQUE: Multiplanar, multiecho pulse sequences of the brain and surrounding structures were obtained without intravenous contrast.  COMPARISON:  10/15/2014 head CT.  No comparison brain MR.  FINDINGS: Examination limited with a total of four sequences. Patient was not able to complete the exam.  Large right middle cerebral artery acute infarct involving portions of the right temporal lobe, right frontal lobe, right parietal lobe, right basal ganglia and corona radiata. Mild local mass effect upon the right lateral ventricle without midline shift.  No obvious intracranial hemorrhage.  No intracranial mass lesion seen on this limited exam  Limited evaluation of the upper cervical spine and intracranial vasculature. Cerebellar tonsils may be minimally low lying but within the range of normal limits.  IMPRESSION: Motion degraded limited exam with evidence of large right hemispheric acute infarct with mild local mass effect upon the right lateral ventricle as noted above.   Electronically Signed   By: Chauncey Cruel M.D.   On: 10/15/2014 12:46   Dg Chest Port 1 View  10/15/2014   CLINICAL DATA:  Acute right MCA distribution stroke.  EXAM: PORTABLE CHEST - 1 VIEW  COMPARISON:  None.  FINDINGS: Suboptimal inspiration accounts for crowded bronchovascular markings,  especially in the bases, and accentuates the cardiac silhouette. Taking this into account, cardiomediastinal silhouette unremarkable. Lungs clear. Bronchovascular markings normal. Pulmonary vascularity normal. No visible pleural effusions. No pneumothorax.  IMPRESSION: Suboptimal inspiration.  No acute cardiopulmonary disease.   Electronically Signed   By: Evangeline Dakin M.D.   On: 10/15/2014 07:52     PHYSICAL EXAM Pleasant middle aged african [place] male not in distress. . Afebrile. Head is nontraumatic. Neck is supple without bruit. Hearing is normal. Cardiac exam no murmur or gallop. Lungs are clear to auscultation. Distal pulses are well felt. Neurological Exam : Awake alert follows commands well. No dysarthria. No aphasia. Right gaze preference but able to look to the left was midline. Fundi were not visualized. Dense left homonymous hemianopsia. Mild left lower facial weakness. Tongue deviates to the left. Dense left hemiplegia with 0/5 left upper extremity and 1/5 left lower extremity strength. Diminished left hemi-sensation. No left hemineglect   Good strength sensation and coordination on the right side. Right plantar downgoing.  Left downgoing. Gait was not tested. ASSESSMENT/PLAN Mr. Audry Pecina is a 53 y.o. male with history of hypertension and hyperlipidemia with noncompliance with treatment and heavy etoh use presenting with new onset slurred speech, left facial droop and left hemiplegia. He did not receive IV t-PA due to delay in arrival.   Stroke:  Non-dominant large right MCA infarct, embolic secondary to unknown source  Resultant  Left hemiparesis  MRI  Large R MCA with mass effect on R lateral ventricle  MRA  Unable to tolerate  Carotid Doppler  pending   2D Echo  pending   HgbA1c pending  UDS negative  TEE to look for embolic source. Arranged with [facility] for tomorrow or Friday, based on their schedule.  If positive for PFO (patent  foramen ovale), check bilateral lower extremity venous dopplers to rule out DVT as possible source of stroke. (I have made patient NPO after midnight tonight in case they can do tomorrow).   Given his young age, will check hypercoagulable tests (lupus anticoagulant, cardiolipin antibody) and vasculitic labs (C3, C4, CH50, ANA, ESR) as well as HIV and RPR as possible stroke sources.  Lovenox 40 mg sq daily for VTE prophylaxis  Diet NPO time specified . ST to assess.  no antithrombotics prior to admission, now on aspirin 300 mg suppository daily  Ongoing aggressive stroke risk factor management  Therapy recommendations:  pending   Disposition:  pending   Hyperlipidemia  LDL 118, goal < 70  Add statin once able to swallow  Continue statin at discharge  Other Stroke Risk Factors  Former Cigarette smoker, stopped smoking per record  ETOH abuse, heavy, placed on CIWA protocol  Other Active Problems  hyperkalemia  Hospital day # 0  Burnetta Sabin, MSN, APRN, ANVP-BC, AGPCNP-BC [facility] Pager: [phone] 10/15/2014 1:07 PM  I have personally examined this patient, reviewed notes, independently viewed imaging studies, participated in medical decision making and plan of care. I have made any additions or clarifications directly to the above note. Agree with note above. I suspect he has a right posterior division MCA or posterior cerebral artery embolic infarct and stroke workup is ensuing. I left a message for his significant other to call me to discuss his care. Given his young age will need evaluation for vasculitis, urine drug screen, hypercoagulable panel and TEE  Antony Contras, MD Medical Director [facility] Pager: [phone] 10/15/2014 1:50 PM    To contact Stroke Continuity provider, please refer to [email or web address] After hours, contact General Neurology

## 2014-10-15 NOTE — Progress Notes (Signed)
CARE MANAGEMENT NOTE 10/15/2014  Patient:  Naida SleightBURROUGHS,[place]   Account Number:  [id number]  Date Initiated:  10/15/2014  Documentation initiated by:  Jiles CrockerHANDLER,[place]  Subjective/Objective Assessment:   ADMITTED WITH STROKE     Action/Plan:   CM FOLLOWING FOR DCP   Anticipated DC Date:  10/18/2014   Anticipated DC Plan:  AWAITING FOR PT/OT EVALS FOR DISPOSITION NEEDS  In-house referral  Financial Counselor      DC Planning Services  CM consult         Status of service:  In process, will continue to follow Medicare Important Message given?   (If response is "NO", the following Medicare IM given date fields will be blank)  Per UR Regulation:  Reviewed for med. necessity/level of care/duration of stay  Comments:  11/24/2015Abelino Derrick- B Jalyah Weinheimer RN,BSN,MHA [phone]

## 2014-10-16 ENCOUNTER — Encounter (HOSPITAL_COMMUNITY): Payer: Self-pay | Admitting: *Deleted

## 2014-10-16 ENCOUNTER — Inpatient Hospital Stay (HOSPITAL_COMMUNITY): Payer: Medicaid Other

## 2014-10-16 ENCOUNTER — Encounter (HOSPITAL_COMMUNITY)
Admission: EM | Disposition: A | Discharge status: Rehabilitation Facility | Payer: Self-pay | Source: Home / Self Care | Attending: Internal Medicine

## 2014-10-16 DIAGNOSIS — I639 Cerebral infarction, unspecified: Secondary | ICD-10-CM

## 2014-10-16 DIAGNOSIS — E785 Hyperlipidemia, unspecified: Secondary | ICD-10-CM

## 2014-10-16 HISTORY — PX: TEE WITHOUT CARDIOVERSION: SHX5443

## 2014-10-16 LAB — BASIC METABOLIC PANEL
ANION GAP: 13 (ref 5–15)
Anion gap: 14 (ref 5–15)
BUN: 15 mg/dL (ref 6–23)
BUN: 16 mg/dL (ref 6–23)
CHLORIDE: 103 meq/L (ref 96–112)
CO2: 23 mEq/L (ref 19–32)
CO2: 24 meq/L (ref 19–32)
Calcium: 9.1 mg/dL (ref 8.4–10.5)
Calcium: 9.2 mg/dL (ref 8.4–10.5)
Chloride: 100 mEq/L (ref 96–112)
Creatinine, Ser: 1 mg/dL (ref 0.50–1.35)
Creatinine, Ser: 1.1 mg/dL (ref 0.50–1.35)
GFR calc Af Amer: >90 mL/min (ref >90)
GFR calc Af Amer: 86 mL/min — ABNORMAL LOW (ref >90)
GFR calc non Af Amer: 74 mL/min — ABNORMAL LOW (ref >90)
GFR, EST NON AFRICAN AMERICAN: 83 mL/min — AB (ref >90)
GLUCOSE: 89 mg/dL (ref 70–99)
Glucose, Bld: 89 mg/dL (ref 70–99)
POTASSIUM: 4 meq/L (ref 3.7–5.3)
Potassium: 4.3 mEq/L (ref 3.7–5.3)
SODIUM: 137 meq/L (ref 137–147)
Sodium: 140 mEq/L (ref 137–147)

## 2014-10-16 LAB — LUPUS ANTICOAGULANT PANEL
DRVVT: 34.7 s (ref <42.9)
Lupus Anticoagulant: NOT DETECTED
PTT Lupus Anticoagulant: 35.7 secs (ref 28.0–43.0)

## 2014-10-16 LAB — MAGNESIUM: Magnesium: 2 mg/dL (ref 1.5–2.5)

## 2014-10-16 LAB — C4 COMPLEMENT: Complement C4, Body Fluid: 44 mg/dL — ABNORMAL HIGH (ref 10–40)

## 2014-10-16 LAB — HIV ANTIBODY (ROUTINE TESTING W REFLEX): HIV 1&2 Ab, 4th Generation: NONREACTIVE

## 2014-10-16 LAB — PROTIME-INR
INR: 1.08 (ref 0.00–1.49)
PROTHROMBIN TIME: 14.1 s (ref 11.6–15.2)

## 2014-10-16 LAB — ANA: Anti Nuclear Antibody(ANA): NEGATIVE

## 2014-10-16 LAB — C3 COMPLEMENT: C3 Complement: 130 mg/dL (ref 90–180)

## 2014-10-16 LAB — RPR

## 2014-10-16 LAB — PHOSPHORUS: Phosphorus: 3.3 mg/dL (ref 2.3–4.6)

## 2014-10-16 SURGERY — ECHOCARDIOGRAM, TRANSESOPHAGEAL
Anesthesia: Moderate Sedation

## 2014-10-16 MED ORDER — FENTANYL CITRATE 0.05 MG/ML IJ SOLN
INTRAMUSCULAR | Status: DC | PRN
  Administered 2014-10-16 (×2): 25 ug via INTRAVENOUS

## 2014-10-16 MED ORDER — MIDAZOLAM HCL 10 MG/2ML IJ SOLN
INTRAMUSCULAR | Status: DC | PRN
  Administered 2014-10-16 (×2): 2 mg via INTRAVENOUS

## 2014-10-16 MED ORDER — FENTANYL CITRATE 0.05 MG/ML IJ SOLN
INTRAMUSCULAR | Status: AC
  Filled 2014-10-16: qty 2

## 2014-10-16 MED ORDER — IOHEXOL 350 MG/ML SOLN
80.0000 mL | Freq: Once | INTRAVENOUS | Status: AC | PRN
  Administered 2014-10-16: 80 mL via INTRAVENOUS

## 2014-10-16 MED ORDER — DIPHENHYDRAMINE HCL 50 MG/ML IJ SOLN
INTRAMUSCULAR | Status: AC
  Filled 2014-10-16: qty 1

## 2014-10-16 MED ORDER — SODIUM CHLORIDE 0.9 % IV SOLN: INTRAVENOUS | Status: DC

## 2014-10-16 MED ORDER — BUTAMBEN-TETRACAINE-BENZOCAINE 2-2-14 % EX AERO
INHALATION_SPRAY | CUTANEOUS | Status: DC | PRN
  Administered 2014-10-16: 2 via TOPICAL

## 2014-10-16 MED ORDER — MIDAZOLAM HCL 5 MG/ML IJ SOLN
INTRAMUSCULAR | Status: AC
  Filled 2014-10-16: qty 2

## 2014-10-16 MED ORDER — ATORVASTATIN CALCIUM 10 MG PO TABS
20.0000 mg | ORAL_TABLET | Freq: Every day | ORAL | Status: DC
  Administered 2014-10-16 – 2014-10-17 (×2): 20 mg via ORAL
  Filled 2014-10-16 (×2): qty 2

## 2014-10-16 NOTE — Progress Notes (Signed)
OT Cancellation Note  Patient Details Name: Cory [name] MRN: [id number] DOB: Sep 22, 1961   Cancelled Treatment:    Reason Eval/Treat Not Completed: Patient at procedure or test/ unavailable Vascular Lab  Cory [name], Cory [name]  Pager: [phone]  10/16/2014, 10:35 AM

## 2014-10-16 NOTE — Progress Notes (Signed)
STROKE TEAM PROGRESS NOTE   HISTORY Cory [name] is an 53 y.o. male history of hypertension and hyperlipidemia with noncompliance with treatment, presenting with new onset slurred speech, left facial droop and left hemiplegia. She has no previous history of stroke nor TIA. He was last seen well by his girlfriend on the morning of 10/14/2014. She noticed that he was sleeping more than usual this afternoon. Patient is known to drink alcohol heavily. He was brought to the emergency room by EMS and code stroke was activated. CT scan of his head showed changes involving the right MCA territory consistent with large acute ischemic infarction. NIH stroke score was 16. Patient was not administered TPA secondary to beyond time under for treatment consideration, and ischemic changes on CT scan. He was admitted for further evaluation and treatment.   SUBJECTIVE (INTERVAL HISTORY) Patient is currently in endo lab for TEE.Overall he feels his condition is stable.  MRI brain Motion degraded limited exam with evidence of large right hemispheric acute infarct with mild local mass effect upon the right lateral ventricle as noted above.    OBJECTIVE Temp:  [98 F (36.7 C)-98.8 F (37.1 C)] 98.2 F (36.8 C) (11/25 1022) Pulse Rate:  [55-82] 55 (11/25 1032) Cardiac Rhythm:  [-] Normal sinus rhythm (11/25 1100) Resp:  [11-18] 14 (11/25 1032) BP: (134-214)/(76-135) 134/80 mmHg (11/25 1032) SpO2:  [98 %-100 %] 99 % (11/25 1032)   Recent Labs Lab 10/15/14 0043 10/15/14 1624  GLUCAP 97 97    Recent Labs Lab 10/15/14 0044 10/15/14 0050 10/16/14 0656 10/16/14 1153  NA 141 141 140 137  K 5.6* 5.5* 4.3 4.0  CL 101 104 103 100  CO2 26  --  23 24  GLUCOSE 113* 118* 89 89  BUN $Re'8 10 15 16  'Djw$ CREATININE 0.93 1.10 1.00 1.10  CALCIUM 9.1  --  9.1 9.2  MG  --   --  2.0  --   PHOS  --   --  3.3  --     Recent Labs Lab 10/15/14 0044  AST 24  ALT 13  ALKPHOS 58  BILITOT 0.3  PROT 8.5*  ALBUMIN 3.6     Recent Labs Lab 10/15/14 0044 10/15/14 0050  WBC 5.1  --   NEUTROABS 2.0  --   HGB 15.6 17.7*  HCT 47.3 52.0  MCV 86.5  --   PLT 220  --    No results for input(s): CKTOTAL, CKMB, CKMBINDEX, TROPONINI in the last 168 hours.  Recent Labs  10/15/14 0044 10/16/14 1153  LABPROT 13.7 14.1  INR 1.03 1.08    Recent Labs  10/15/14 0056  COLORURINE YELLOW  LABSPEC 1.019  PHURINE 6.0  GLUCOSEU NEGATIVE  HGBUR NEGATIVE  BILIRUBINUR NEGATIVE  KETONESUR NEGATIVE  PROTEINUR NEGATIVE  UROBILINOGEN 0.2  NITRITE NEGATIVE  LEUKOCYTESUR NEGATIVE       Component Value Date/Time   CHOL 180 10/15/2014 0044   TRIG 115 10/15/2014 0044   HDL 39* 10/15/2014 0044   CHOLHDL 4.6 10/15/2014 0044   VLDL 23 10/15/2014 0044   LDLCALC 118* 10/15/2014 0044   Lab Results  Component Value Date   HGBA1C 6.1* 10/15/2014      Component Value Date/Time   LABOPIA NONE DETECTED 10/15/2014 0056   COCAINSCRNUR NONE DETECTED 10/15/2014 0056   LABBENZ NONE DETECTED 10/15/2014 0056   AMPHETMU NONE DETECTED 10/15/2014 0056   THCU NONE DETECTED 10/15/2014 0056   LABBARB NONE DETECTED 10/15/2014 0056     Recent Labs  Lab 10/15/14 0044  ETH 103*    Ct Head Wo Contrast  10/15/2014   CLINICAL DATA:  Left-sided weakness.  Code stroke.  EXAM: CT HEAD WITHOUT CONTRAST  TECHNIQUE: Contiguous axial images were obtained from the base of the skull through the vertex without intravenous contrast.  COMPARISON:  None.  FINDINGS: Skull and Sinuses:Negative for fracture or destructive process. The mastoids, middle ears, and imaged paranasal sinuses are clear.  Orbits: No acute abnormality.  Brain: Small area of low-density within the high and posterior right frontal cortex and subcortical white matter. In the same region, there is patchy low-density within the deep cerebral white matter. Focal low-density expansion of the right caudate nucleus (anterior body); the upper right putamen is difficult to visualize.  Gray-white differentiation is somewhat blurred in the right lateral temporal lobe, but this finding is indeterminate due to image noise. No hemorrhage, hydrocephalus, or shift.  Critical Value/emergent results were called by telephone at the time of interpretation on 10/15/2014 at 1:15 am to Dr. Nicole Kindred, who verbally acknowledged these results.  IMPRESSION: 1. Ischemic changes in the right MCA territory affecting the frontal cortex, anterior corona radiata, and dorsal striatum. 2. No acute intracranial hemorrhage.   Electronically Signed   By: Jorje Guild M.D.   On: 10/15/2014 01:22   Mr Brain Wo Contrast  10/15/2014   CLINICAL DATA:  53 year old male with left facial droop and left hemiplegia. History of alcoholism. Initial encounter.  EXAM: MRI HEAD WITHOUT CONTRAST  TECHNIQUE: Multiplanar, multiecho pulse sequences of the brain and surrounding structures were obtained without intravenous contrast.  COMPARISON:  10/15/2014 head CT.  No comparison brain MR.  FINDINGS: Examination limited with a total of four sequences. Patient was not able to complete the exam.  Large right middle cerebral artery acute infarct involving portions of the right temporal lobe, right frontal lobe, right parietal lobe, right basal ganglia and corona radiata. Mild local mass effect upon the right lateral ventricle without midline shift.  No obvious intracranial hemorrhage.  No intracranial mass lesion seen on this limited exam  Limited evaluation of the upper cervical spine and intracranial vasculature. Cerebellar tonsils may be minimally low lying but within the range of normal limits.  IMPRESSION: Motion degraded limited exam with evidence of large right hemispheric acute infarct with mild local mass effect upon the right lateral ventricle as noted above.   Electronically Signed   By: Chauncey Cruel M.D.   On: 10/15/2014 12:46   Dg Chest Port 1 View  10/15/2014   CLINICAL DATA:  Acute right MCA distribution stroke.  EXAM:  PORTABLE CHEST - 1 VIEW  COMPARISON:  None.  FINDINGS: Suboptimal inspiration accounts for crowded bronchovascular markings, especially in the bases, and accentuates the cardiac silhouette. Taking this into account, cardiomediastinal silhouette unremarkable. Lungs clear. Bronchovascular markings normal. Pulmonary vascularity normal. No visible pleural effusions. No pneumothorax.  IMPRESSION: Suboptimal inspiration.  No acute cardiopulmonary disease.   Electronically Signed   By: Evangeline Dakin M.D.   On: 10/15/2014 07:52     PHYSICAL EXAM Pleasant middle aged african [place] male not in distress. . Afebrile. Head is nontraumatic. Neck is supple without bruit. Hearing is normal. Cardiac exam no murmur or gallop. Lungs are clear to auscultation. Distal pulses are well felt. Neurological Exam : Awake alert follows commands well. No dysarthria. No aphasia. Right gaze preference but able to look to the left was midline. Fundi were not visualized. Dense left homonymous hemianopsia. Mild left lower facial weakness. Tongue  deviates to the left. Dense left hemiplegia with 0/5 left upper extremity and 1/5 left lower extremity strength. Diminished left hemi-sensation. No left hemineglect   Good strength sensation and coordination on the right side. Right plantar downgoing. Left downgoing. Gait was not tested. ASSESSMENT/PLAN Cory [name] is a 53 y.o. male with history of hypertension and hyperlipidemia with noncompliance with treatment and heavy etoh use presenting with new onset slurred speech, left facial droop and left hemiplegia. He did not receive IV t-PA due to delay in arrival.   Stroke:  Non-dominant large right MCA infarct, embolic secondary to unknown source  Resultant  Left hemiparesis  MRI  Large R MCA with mass effect on R lateral ventricle  MRA  Unable to tolerate-will check CTA brain  Carotid Doppler  1-39% right internal carotid artery stenosis. Unable to visualize the left  internal carotid and bilateral vertebral arteries due to poor patient cooperation.   2D Echo  Left ventricle: The cavity size was normal. Wall thickness was increased in a pattern of mild LVH. Systolic function was normal. The estimated ejection fraction was in the range of 55% to 60%. Wall motion was normal; there were no regional wall motion abnormalities.  HgbA1c 6.1  UDS negative  TEE to look for embolic source. Arranged with [facility] for tomorrow or Friday, based on their schedule.  If positive for PFO (patent foramen ovale), check bilateral lower extremity venous dopplers to rule out DVT as possible source of stroke. (I have made patient NPO after midnight tonight in case they can do tomorrow).   Given his young age, will check hypercoagulable tests (lupus anticoagulant, cardiolipin antibody) and vasculitic labs (C3, C4, CH50, ANA, ESR) as well as HIV and RPR as possible stroke sources.  Lovenox 40 mg sq daily for VTE prophylaxis    . ST to assess.  no antithrombotics prior to admission, now on aspirin 300 mg suppository daily  Ongoing aggressive stroke risk factor management  Therapy recommendations:  pending   Disposition:  pending   Hyperlipidemia  LDL 118, goal < 70  Add statin once able to swallow  Continue statin at discharge  Other Stroke Risk Factors  Former Cigarette smoker, stopped smoking per record  ETOH abuse, heavy, placed on CIWA protocol  Other Active Problems  hyperkalemia  Hospital day # 1  SHARON BIBY, MSN, APRN, ANVP-BC, AGPCNP-BC [facility] Pager: [phone] 10/16/2014 1:12 PM  I have personally examined this patient, reviewed notes, independently viewed imaging studies, participated in medical decision making and plan of care. I have made any additions or clarifications directly to the above note. Agree with note above. He has large right brain stroke likely of embolic etiology without  definite identified source of embolism. Given his young age will need evaluation for vasculitis, urine drug screen, hypercoagulable panel and TEE  Antony Contras, MD Medical Director [facility] Pager: [phone] 10/16/2014 1:12 PM    To contact Stroke Continuity provider, please refer to [email or web address] After hours, contact General Neurology

## 2014-10-16 NOTE — Interval H&P Note (Signed)
History and Physical Interval Note:  10/16/2014 9:08 AM  Naida SleightStephen Lacasse  has presented today for surgery, with the diagnosis of stroke  The various methods of treatment have been discussed with the patient and family. After consideration of risks, benefits and other options for treatment, the patient has consented to  Procedure(s): TRANSESOPHAGEAL ECHOCARDIOGRAM (TEE) (N/A) as a surgical intervention .  The patient's history has been reviewed, patient examined, no change in status, stable for surgery.  I have reviewed the patient's chart and labs.  Questions were answered to the patient's satisfaction.     Teneka Malmberg

## 2014-10-16 NOTE — Procedures (Signed)
Objective Swallowing Evaluation: Modified Barium Swallowing Study  Patient Details  Name: Cory SleightStephen [name] MRN: [id number] Date of Birth: June 22, 1961  Today's Date: 10/16/2014 Time: 1250-1314 SLP Time Calculation (min) (ACUTE ONLY): 24 min  Past Medical History:  Past Medical History  Diagnosis Date  . Medical history non-contributory    Past Surgical History:  Past Surgical History  Procedure Laterality Date  . No past surgeries     HPI:  53 y.o. male with history of alcoholism was brought to the ER with left facial and left-sided hemiplegia. CT head showed features consistent for CVA on the right MCA territory. CXR Suboptimal inspiration. No acute cardiopulmonary disease.Cory [name].  Poor performance on bedside swallow eval with recs for MBS.      Assessment / Plan / Recommendation Clinical Impression  Dysphagia Diagnosis: Moderate oral phase dysphagia;Mild pharyngeal phase dysphagia Clinical impression: Pt presents with a moderate oral, mild pharyngeal dysphagia marked by significant deficits in sensation with copious amounts of all materials spilling from left anterior oral caviity without awareness.  There is also premature loss of all POs over base of tongue, with thin liquids immediately being aspirated without awareness.  Pharyngeal clearance of materials was adequate with no residue post-swallow.  Pt extremely impulsive, with difficulty waiting for instructions, following commands, and lacking insight.  Recommend initiating a dysphagia 2 diet with nectar-thick liquids initially.  Pt will require full supervision given cognitive deficits; will require cues during meal to maintain safety.      Treatment Recommendation  Therapy as outlined in treatment plan below    Diet Recommendation Dysphagia 2 (Fine chop);Nectar-thick liquid   Liquid Administration via: Cup Medication Administration: Whole meds with puree Supervision: Patient able to self feed;Full supervision/cueing for  compensatory strategies Compensations: Small sips/bites;Slow rate;Check for pocketing;Check for anterior loss Postural Changes and/or Swallow Maneuvers: Seated upright 90 degrees    Other  Recommendations Oral Care Recommendations: Oral care BID Other Recommendations: Order thickener from pharmacy   Follow Up Recommendations   (tba)    Frequency and Duration min 3x week  2 weeks       SLP Swallow Goals     General Date of Onset: 10/15/14 HPI: 53 y.o. male with history of alcoholism was brought to the ER with left facial and left-sided hemiplegia. CT head showed features consistent for CVA on the right MCA territory. CXR Suboptimal inspiration. No acute cardiopulmonary disease.Cory [name].  Poor performance on bedside swallow eval with recs for MBS.  Type of Study: Modified Barium Swallowing Study Reason for Referral: Objectively evaluate swallowing function Diet Prior to this Study: NPO Temperature Spikes Noted: No Respiratory Status: Room air History of Recent Intubation: No Behavior/Cognition: Alert;Cooperative;Pleasant mood Oral Cavity - Dentition: Poor condition;Missing dentition Oral Motor / Sensory Function: Impaired - see Bedside swallow eval Self-Feeding Abilities: Able to feed self Patient Positioning: Upright in chair Baseline Vocal Quality: Low vocal intensity;Clear Volitional Cough: Strong Volitional Swallow: Able to elicit Anatomy: Within functional limits Pharyngeal Secretions: Not observed secondary MBS    Reason for Referral Objectively evaluate swallowing function   Oral Phase Oral Preparation/Oral Phase Oral Phase: Impaired Oral - Solids Oral - Puree: Left anterior bolus loss;Weak lingual manipulation;Incomplete tongue to palate contact Oral - Mechanical Soft: Weak lingual manipulation Oral Phase - Comment Oral Phase - Comment: thin and nectar bolus loss from left of mouth without awareness   Pharyngeal Phase Pharyngeal Phase Pharyngeal Phase:  Impaired Pharyngeal - Nectar Pharyngeal - Nectar Cup: Premature spillage to pyriform sinuses Pharyngeal - Thin Pharyngeal -  Thin Cup: Premature spillage to pyriform sinuses;Penetration/Aspiration during swallow;Reduced airway/laryngeal closure;Trace aspiration Penetration/Aspiration details (thin cup): Material enters airway, passes BELOW cords without attempt by patient to eject out (silent aspiration) Pharyngeal - Solids Pharyngeal - Puree: Premature spillage to valleculae Pharyngeal - Regular: Premature spillage to valleculae  Cervical Esophageal Phase    Cory [name] Cory [name], [place] CCC/SLP Pager [phone]              Blenda MountsCouture, Cory [name] 10/16/2014, 2:54 PM

## 2014-10-16 NOTE — Evaluation (Signed)
Physical Therapy Evaluation Patient Details Name: Cory [name] MRN: [id number] DOB: [birth year] Today's Date: 10/16/2014   History of Present Illness  53 yo male admitted with Lt facial droop and Lt hemiplegia. CT (+) Rt MCA NIH :16 PMH: ETOH abuse, HTN, hx of ankle fx    Clinical Impression  Patient presents with functional limitations due to deficits listed in PT problem list (see below). Pt with dense L hemiplegia, poor trunk control and balance deficits affecting safe mobility. Pt with impaired safety awareness putting pt at increased risk for falls. Pt motivated to return to PLOF. Pt would benefit from skilled PT to improve trunk control, balance, gait and overall mobility so pt can maximize independence and ease burden of care prior to return home.    Follow Up Recommendations CIR;Supervision/Assistance - 24 hour    Equipment Recommendations  Other (comment) (defer to CIR)    Recommendations for Other Services       Precautions / Restrictions Precautions Precautions: Fall Precaution Comments: brace on left ankle from recent fx. Restrictions Weight Bearing Restrictions: No      Mobility  Bed Mobility Overal bed mobility: Needs Assistance Bed Mobility: Supine to Sit     Supine to sit: Min assist     General bed mobility comments: Heavy use of rails and VC's for technique. Multiple attempts to get to EOB. Increased effort.  Transfers Overall transfer level: Needs assistance Equipment used: 2 person hand held assist Transfers: Sit to/from UGI CorporationStand;Stand Pivot Transfers Sit to Stand: Mod assist;+2 physical assistance Stand pivot transfers: Mod assist;+2 physical assistance       General transfer comment: Mod +2 (A) for sit<>stand with blocking of L knee. Pt required assist for boost to stand and to maintain balance. Verbal cues to pick head up and stand up tall. Mod +2 (A) for stand-pivot to recliner with blocking of L knee and manual (A) to move L foot back. Pt  attempted to hop on RLE with knee buckling noted.   Ambulation/Gait             General Gait Details: Deferred.  Stairs            Wheelchair Mobility    Modified Rankin (Stroke Patients Only) Modified Rankin (Stroke Patients Only) Pre-Morbid Rankin Score: Severe disability Modified Rankin: No symptoms     Balance Overall balance assessment: Needs assistance Sitting-balance support: Feet supported;Single extremity supported Sitting balance-Leahy Scale: Poor Sitting balance - Comments: Requires Miin-Mod A to maintain sitting balance, able to sit unsupported for short periods but fatigues resulting in left lateral lean. Postural control: Left lateral lean Standing balance support: No upper extremity supported;During functional activity Standing balance-Leahy Scale: Zero Standing balance comment: Requires assist of 2 to maintain standing balance with therapist blocking Left knee to prevent buckling. Cues for upright posture. Able to stand ~1 minute with mod A of 2.                             Pertinent Vitals/Pain Pain Assessment: 0-10 Pain Score: 5  Pain Location: HA Pain Descriptors / Indicators: Headache;Sore Pain Intervention(s): Monitored during session;Repositioned    Home Living Family/patient expects to be discharged to:: Private residence Living Arrangements: Spouse/significant other   Type of Home: House Home Access: Stairs to enter Entrance Stairs-Rails: None Secretary/administratorntrance Stairs-Number of Steps: 5 Home Layout: One level Home Equipment: Crutches Additional Comments: Still driving    Prior Function Level of Independence: Independent  Hand Dominance   Dominant Hand: Right    Extremity/Trunk Assessment   Upper Extremity Assessment: Defer to OT evaluation       LUE Deficits / Details: Fine and gross motor AROM 0; Will assess in supine next session   Lower Extremity Assessment: LLE deficits/detail;Generalized  weakness;RLE deficits/detail RLE Deficits / Details: Decreased functional strength noted as pt not able to stand on RLE without knee buckling. LLE Deficits / Details: No AROM knee extension, able to perform knee flexion AROM with cues, no ankle/hip AROM. Grossly ~0/5 knee extension, 0/5 DF, 2/5 knee flexion.     Communication   Communication: No difficulties  Cognition Arousal/Alertness: Awake/alert Behavior During Therapy: Flat affect Overall Cognitive Status: Impaired/Different from baseline Area of Impairment: Attention;Following commands;Safety/judgement;Awareness;Problem solving   Current Attention Level: Sustained Memory: Decreased short-term memory Following Commands: Follows one step commands inconsistently;Follows one step commands with increased time Safety/Judgement: Decreased awareness of safety;Decreased awareness of deficits Awareness: Emergent Problem Solving: Difficulty sequencing;Requires verbal cues General Comments: Pt reporting, "if I had my car, I would walk down to the parking lot and drive to get myself someone to eat," after requiring 2 person assist to get to chair.    General Comments General comments (skin integrity, edema, etc.): Pt with left facial droop and drooling throughout session. Placed towel on lap and encouraged wiping mouth independently.    Exercises        Assessment/Plan    PT Assessment Patient needs continued PT services  PT Diagnosis Difficulty walking;Generalized weakness;Hemiplegia non-dominant side   PT Problem List Decreased strength;Decreased coordination;Decreased range of motion;Decreased cognition;Impaired sensation;Decreased activity tolerance;Impaired tone;Decreased balance;Decreased safety awareness;Decreased mobility;Decreased knowledge of precautions  PT Treatment Interventions Balance training;Gait training;DME instruction;Neuromuscular re-education;Patient/family education;Functional mobility training;Therapeutic  activities;Therapeutic exercise;Wheelchair mobility training;Cognitive remediation   PT Goals (Current goals can be found in the Care Plan section) Acute Rehab PT Goals Patient Stated Goal: To get something to eat PT Goal Formulation: With patient Time For Goal Achievement: 10/30/14 Potential to Achieve Goals: Good    Frequency Min 4X/week   Barriers to discharge Decreased caregiver support;Inaccessible home environment      Co-evaluation PT/OT/SLP Co-Evaluation/Treatment: Yes Reason for Co-Treatment: Complexity of the patient's impairments (multi-system involvement);For patient/therapist safety PT goals addressed during session: Mobility/safety with mobility;Strengthening/ROM OT goals addressed during session: ADL's and self-care;Strengthening/ROM       End of Session Equipment Utilized During Treatment: Gait belt Activity Tolerance: Patient tolerated treatment well Patient left: in chair;with call bell/phone within reach;with chair alarm set Nurse Communication: Mobility status         Time: 1345-1413 PT Time Calculation (min) (ACUTE ONLY): 28 min   Charges:   PT Evaluation $Initial PT Evaluation Tier I: 1 Procedure PT Treatments $Therapeutic Activity: 8-22 mins   PT G CodesAlvie Heidelberg:          Folan, Madelina Sanda A 10/16/2014, 3:44 PM Alvie HeidelbergShauna Folan, PT, DPT [phone]

## 2014-10-16 NOTE — Progress Notes (Signed)
10/16/14 1757  What Happened  Was fall witnessed? Yes  Who witnessed fall? RN and NT  Patients activity before fall to/from bed, chair, or stretcher  Point of contact other (comment) (knee)  Was patient injured? No  Follow Up  MD notified Ghimire  Time MD notified [phone]  Additional tests No  Progress note created (see row info) Yes  Adult Fall Risk Assessment  Risk Factor Category (scoring not indicated) High fall risk per protocol (document High fall risk)  Patient's Fall Risk High Fall Risk (>13 points)  Adult Fall Risk Interventions  Required Bundle Interventions *See Row Information* High fall risk - low, moderate, and high requirements implemented  Additional Interventions Fall risk signage  Vitals  Temp 99.1 F (37.3 C)  Temp Source Axillary  BP (!) 165/111 mmHg  BP Location Right Arm  BP Method Automatic  Patient Position (if appropriate) Lying  Pulse Rate 75  Pulse Rate Source Dinamap  Resp 16  Oxygen Therapy  SpO2 100 %  O2 Device Room Air  Pain Assessment  Pain Assessment No/denies pain  Neurological  Neuro (WDL) X  Level of Consciousness Alert  Orientation Level Oriented X4  Cognition Follows commands  Speech Delayed responses;Slurred/Dysarthria  Pupil Assessment  Yes  R Pupil Size (mm) 3  R Pupil Shape Round  R Pupil Reaction Brisk  L Pupil Size (mm) 4  L Pupil Shape Round  L Pupil Reaction Sluggish  Motor Function/Sensation Assessment Grip;Dorsiflexion;Plantar flexion;Motor response;Sensation  R Hand Grip Present;Strong  L Hand Grip Absent   R Foot Dorsiflexion Present;Strong  L Foot Dorsiflexion Absent  R Foot Plantar Flexion Present;Strong  L Foot Plantar Flexion Absent  RUE Motor Response Purposeful movement  RUE Sensation Full sensation  LUE Motor Response No movement to painful stimulus  LUE Sensation No sensation (absent)  RLE Motor Response Purposeful movement  RLE Sensation Full sensation  LLE Motor Response No movement to painful  stimulus  LLE Sensation No sensation (absent)  Neuro Symptoms None  Glasgow Coma Scale  Eye Opening 4  Best Verbal Response (NON-intubated) 5  Best Motor Response 6  Glasgow Coma Scale Score 15  NIH Stroke Scale  Level of Consciousness (1a. ) 0  LOC Questions (1b. ) 0  LOC Commands (1c. ) 0  Best Gaze (2. ) 0  Visual (3. ) 2  Facial Palsy (4. ) 1  Motor Arm, Left (5a. ) 3  Motor Arm, Right (5b. ) 0  Motor Leg, Left (6a. ) 3  Motor Leg, Right (6b. ) 0  Limb Ataxia (7. ) 0  Sensory (8. ) 1  Best Language (9. ) 0  Dysarthria (10. ) 1  Inattention/Extinction 1  Total 12  Musculoskeletal  Musculoskeletal (WDL) X  Assistive Device None  Weight Bearing Restrictions No  Musculoskeletal Details  LUE Weakness;Paralysis  LLE Weakness;Paralysis;Ortho/Supportive Device;Injury/trauma  LLE Ortho/Supportive Device Brace (Comment)  Integumentary  Integumentary (WDL) WDL

## 2014-10-16 NOTE — H&P (View-Only) (Signed)
PATIENT DETAILS Name: Cory SleightStephen [name] Age: 53 y.o. Sex: male Date of Birth: 11-24-1960 Admit Date: 10/15/2014 Admitting Physician Eduard ClosArshad N Kakrakandy, MD PCP:No PCP Per Patient  Subjective: Speaks slowly but clear, left hemiplegia persists.  Assessment/Plan: Principal Problem:   Acute CVA (cerebrovascular accident): MCA territory infarct. EKG shows sinus rhythm. However likely probably embolic infarct. Await further workup, including 2-D echocardiogram and carotid Doppler. Await further neurology evaluation. LDL elevated at 118, will start statins once oral intake resumes. A1c is pending. Continue aspirin suppository for now. Unfortunately, continues to have left hemiplegia, left facial droop.  Active Problems:   Alcohol abuse: Patient claims he drinks a fair amount of alcohol-beer, liquor and block,. Last drink on 11/23. Currently awake, alert and without any tremors. Continue Ativan per CIWA protocol. High risk for progression to delirium tremens.    Mild hyperkalemia: Unknown etiology, will give Kayexalate suppository, and recheck electrolytes in a.m.  Disposition: Remain inpatient  Antibiotics:  Anti-infectives    None      DVT Prophylaxis: Prophylactic Lovenox   Code Status: Full code   Family Communication None at bedside  Procedures:  None  CONSULTS:  neurology  Time spent 40 minutes-which includes 50% of the time with face-to-face with patient/ family and coordinating care related to the above assessment and plan.  MEDICATIONS: Scheduled Meds: . aspirin  300 mg Rectal Daily   Or  . aspirin  325 mg Oral Daily  . enoxaparin (LOVENOX) injection  40 mg Subcutaneous Q24H  . folic acid  1 mg Oral Daily  . LORazepam  0-4 mg Intravenous Q6H   Followed by  . [START ON 10/17/2014] LORazepam  0-4 mg Intravenous Q12H  . multivitamin with minerals  1 tablet Oral Daily  . thiamine  100 mg Oral Daily   Or  . thiamine  100 mg Intravenous Daily    Continuous Infusions: . sodium chloride 100 mL/hr at 10/15/14 0301   PRN Meds:.acetaminophen, LORazepam **OR** LORazepam, senna-docusate    PHYSICAL EXAM: Vital signs in last 24 hours: Filed Vitals:   10/15/14 0226 10/15/14 0424 10/15/14 0625 10/15/14 0832  BP: 139/107 143/103 156/107 162/108  Pulse: 58 95 89 71  Temp: 98.6 F (37 C) 97.8 F (36.6 C) 98.2 F (36.8 C) 98.4 F (36.9 C)  TempSrc: Oral Oral Oral Oral  Resp: 18 18 18 18   Height: 5\' 8"  (1.727 m)     Weight: 79.289 kg (174 lb 12.8 oz)     SpO2: 98% 99% 98% 99%    Weight change:  Filed Weights   10/15/14 0035 10/15/14 0226  Weight: 81.647 kg (180 lb) 79.289 kg (174 lb 12.8 oz)   Body mass index is 26.58 kg/(m^2).   Gen Exam: Awake and alert with clear but slow speech.  Left facial droop. Pooling of secretions at the angle of the mouth Neck: Supple, No JVD.   Chest: B/L Clear.   CVS: S1 S2 Regular, no murmurs.  Abdomen: soft, BS +, non tender, non distended.  Extremities: no edema, lower extremities warm to touch. Neurologic: Left sided hemiplegia. Skin: No Rash.   Wounds: N/A.   Intake/Output from previous day: No intake or output data in the 24 hours ending 10/15/14 1121   LAB RESULTS: CBC  Recent Labs Lab 10/15/14 0044 10/15/14 0050  WBC 5.1  --   HGB 15.6 17.7*  HCT 47.3 52.0  PLT 220  --   MCV 86.5  --   MCH  28.5  --   MCHC 33.0  --   RDW 12.4  --   LYMPHSABS 2.6  --   MONOABS 0.4  --   EOSABS 0.2  --   BASOSABS 0.0  --     Chemistries   Recent Labs Lab 10/15/14 0044 10/15/14 0050  NA 141 141  K 5.6* 5.5*  CL 101 104  CO2 26  --   GLUCOSE 113* 118*  BUN 8 10  CREATININE 0.93 1.10  CALCIUM 9.1  --     CBG:  Recent Labs Lab 10/15/14 0043  GLUCAP 97    GFR Estimated Creatinine Clearance: 73.4 mL/min (by C-G formula based on Cr of 1.1).  Coagulation profile  Recent Labs Lab 10/15/14 0044  INR 1.03    Cardiac Enzymes No results for input(s): CKMB,  TROPONINI, MYOGLOBIN in the last 168 hours.  Invalid input(s): CK  Invalid input(s): POCBNP No results for input(s): DDIMER in the last 72 hours. No results for input(s): HGBA1C in the last 72 hours.  Recent Labs  10/15/14 0044  CHOL 180  HDL 39*  LDLCALC 118*  TRIG 115  CHOLHDL 4.6    Recent Labs  10/15/14 0620  TSH 0.641   No results for input(s): VITAMINB12, FOLATE, FERRITIN, TIBC, IRON, RETICCTPCT in the last 72 hours. No results for input(s): LIPASE, AMYLASE in the last 72 hours.  Urine Studies No results for input(s): UHGB, CRYS in the last 72 hours.  Invalid input(s): UACOL, UAPR, USPG, UPH, UTP, UGL, UKET, UBIL, UNIT, UROB, ULEU, UEPI, UWBC, URBC, UBAC, CAST, UCOM, BILUA  MICROBIOLOGY: No results found for this or any previous visit (from the past 240 hour(s)).  RADIOLOGY STUDIES/RESULTS: Ct Head Wo Contrast  10/15/2014   CLINICAL DATA:  Left-sided weakness.  Code stroke.  EXAM: CT HEAD WITHOUT CONTRAST  TECHNIQUE: Contiguous axial images were obtained from the base of the skull through the vertex without intravenous contrast.  COMPARISON:  None.  FINDINGS: Skull and Sinuses:Negative for fracture or destructive process. The mastoids, middle ears, and imaged paranasal sinuses are clear.  Orbits: No acute abnormality.  Brain: Small area of low-density within the high and posterior right frontal cortex and subcortical white matter. In the same region, there is patchy low-density within the deep cerebral white matter. Focal low-density expansion of the right caudate nucleus (anterior body); the upper right putamen is difficult to visualize. Gray-white differentiation is somewhat blurred in the right lateral temporal lobe, but this finding is indeterminate due to image noise. No hemorrhage, hydrocephalus, or shift.  Critical Value/emergent results were called by telephone at the time of interpretation on 10/15/2014 at 1:15 am to Dr. Roseanne RenoStewart, who verbally acknowledged these  results.  IMPRESSION: 1. Ischemic changes in the right MCA territory affecting the frontal cortex, anterior corona radiata, and dorsal striatum. 2. No acute intracranial hemorrhage.   Electronically Signed   By: Tiburcio PeaJonathan  Watts M.D.   On: 10/15/2014 01:22   Dg Chest Port 1 View  10/15/2014   CLINICAL DATA:  Acute right MCA distribution stroke.  EXAM: PORTABLE CHEST - 1 VIEW  COMPARISON:  None.  FINDINGS: Suboptimal inspiration accounts for crowded bronchovascular markings, especially in the bases, and accentuates the cardiac silhouette. Taking this into account, cardiomediastinal silhouette unremarkable. Lungs clear. Bronchovascular markings normal. Pulmonary vascularity normal. No visible pleural effusions. No pneumothorax.  IMPRESSION: Suboptimal inspiration.  No acute cardiopulmonary disease.   Electronically Signed   By: Hulan Saashomas  Lawrence M.D.   On: 10/15/2014 07:52  Jeoffrey Massed, MD  [facility] Pager:[phone]  If 7PM-7AM, please contact night-coverage [email or web address] Password [email or web address] 10/15/2014, 11:21 AM   LOS: 0 days

## 2014-10-16 NOTE — Progress Notes (Signed)
Occupational Therapy Evaluation Patient Details Name: Cory [name] MRN: [id number] DOB: 01/11/1961 Today's Date: 10/16/2014    History of Present Illness 53 yo male admitted with Lt facial droop and Lt hemiplegia. CT (+) Rt MCA NIH :16 PMH: ETOH abuse, HTN, hx of ankle fx   Clinical Impression   Pt currently with functional limitiations due to the deficits listed below (see OT problem list). Pt progressed to standing with mod +2 assist with blocking of L knee. Pt with left lateral lean in sitting and standing. Pt is motivated. Pt will benefit from skilled OT to increase their independence and safety with adls and balance. Recommending CIR for continuing therapy.      Follow Up Recommendations  CIR    Equipment Recommendations  Other (comment) (TBD in next venue)    Recommendations for Other Services Rehab consult     Precautions / Restrictions Precautions Precautions: Fall Restrictions Weight Bearing Restrictions: No      Mobility Bed Mobility Overal bed mobility: Needs Assistance Bed Mobility: Supine to Sit     Supine to sit: Min assist     General bed mobility comments: requires bed rails   Transfers Overall transfer level: Needs assistance Equipment used: 2 person hand held assist Transfers: Sit to/from BJ'sStand;Stand Pivot Transfers Sit to Stand: Mod assist;+2 physical assistance Stand pivot transfers: Mod assist;+2 physical assistance       General transfer comment: Mod +2 (A) for sit<>stand with blocking of L knee. Pt required assist for boost to stand and to maintain balance. Verbal cues to pick head up and stand up tall. Mod +2 (A) for stand-pivot to recliner with blocking of L knee and manual (A) to move L foot back.    Balance Overall balance assessment: Needs assistance Sitting-balance support: Single extremity supported;Feet supported Sitting balance-Leahy Scale: Poor   Postural control: Left lateral lean Standing balance support: No upper  extremity supported;During functional activity Standing balance-Leahy Scale: Zero                              ADL Overall ADL's : Needs assistance/impaired     Grooming: Wash/dry face;Supervision/safety;Sitting Grooming Details (indicate cue type and reason): Pt able to wipe drool from mouth.             Lower Body Dressing: Total assistance;Sitting/lateral leans Lower Body Dressing Details (indicate cue type and reason): Total (A) to don socks. Pt unable to bring R foot across knee without severe L lateral lean and complete LOB while seated EOB.             Functional mobility during ADLs: Moderate assistance;+2 for physical assistance General ADL Comments: Pt with significant L lateral lean during static sitting. Pt already demonstrating compensatory strategies by using RUE to move LUE. Mod +2 (A) for functional mobility due to pt uanble to initiate movement with LLE during transfers in today's sessions     Vision Eye Alignment: Within Functional Limits Alignment/Gaze Preference: Chin down Ocular Range of Motion: Within Functional Limits Tracking/Visual Pursuits: Decreased smoothness of horizontal tracking;Decreased smoothness of vertical tracking;Requires cues, head turns, or add eye shifts to track         Additional Comments: pt reports probably should have glasses.   Perception     Praxis      Pertinent Vitals/Pain Pain Assessment: 0-10 Pain Score: 5  Pain Location: ha     Hand Dominance Right   Extremity/Trunk Assessment Upper Extremity Assessment  Upper Extremity Assessment: LUE deficits/detail LUE Deficits / Details: Fine and gross motor AROM 0; Will assess in supine next session LUE Sensation: decreased light touch;decreased proprioception LUE Coordination: decreased fine motor;decreased gross motor   Lower Extremity Assessment Lower Extremity Assessment: LLE deficits/detail       Communication Communication Communication: No  difficulties   Cognition Arousal/Alertness: Awake/alert Behavior During Therapy: Flat affect Overall Cognitive Status: Impaired/Different from baseline Area of Impairment: Attention;Memory;Following commands;Safety/judgement;Awareness;Problem solving   Current Attention Level: Sustained Memory: Decreased short-term memory Following Commands: Follows one step commands inconsistently (unable to maintain eyes closed) Safety/Judgement: Decreased awareness of deficits;Decreased awareness of safety Awareness: Emergent Problem Solving: Decreased initiation;Difficulty sequencing General Comments: pt requesting girlfriend to bring truck to hosptial and tells her he will drive her home. pt states "I am ready to go home"   General Comments       Exercises       Shoulder Instructions      Home Living Family/patient expects to be discharged to:: Private residence Living Arrangements: Spouse/significant other   Type of Home: House Home Access: Stairs to enter Secretary/administratorntrance Stairs-Number of Steps: 5 Entrance Stairs-Rails: None Home Layout: One level     Bathroom Shower/Tub: Tub/shower unit Shower/tub characteristics: Door FirefighterBathroom Toilet: Standard     Home Equipment: Crutches   Additional Comments: Still driving      Prior Functioning/Environment Level of Independence: Independent             OT Diagnosis: Generalized weakness;Cognitive deficits;Acute pain;Hemiplegia non-dominant side   OT Problem List: Decreased strength;Decreased range of motion;Decreased activity tolerance;Impaired balance (sitting and/or standing);Impaired vision/perception;Decreased coordination;Decreased cognition;Decreased safety awareness;Decreased knowledge of use of DME or AE;Decreased knowledge of precautions;Impaired sensation;Impaired UE functional use;Pain   OT Treatment/Interventions: Self-care/ADL training;Therapeutic exercise;Neuromuscular education;DME and/or AE instruction;Therapeutic  activities;Visual/perceptual remediation/compensation;Patient/family education;Balance training    OT Goals(Current goals can be found in the care plan section) Acute Rehab OT Goals Patient Stated Goal: To get something to eat OT Goal Formulation: With patient Time For Goal Achievement: 10/30/14 Potential to Achieve Goals: Good ADL Goals Pt Will Perform Grooming: with min assist;sitting Pt Will Perform Upper Body Bathing: with min assist;sitting Pt Will Perform Upper Body Dressing: with min assist;sitting Pt Will Transfer to Toilet: with min assist;with +2 assist;stand pivot transfer;bedside commode  OT Frequency: Min 3X/week   Barriers to D/C:            Co-evaluation PT/OT/SLP Co-Evaluation/Treatment: Yes Reason for Co-Treatment: Complexity of the patient's impairments (multi-system involvement);For patient/therapist safety   OT goals addressed during session: ADL's and self-care;Strengthening/ROM      End of Session Equipment Utilized During Treatment: Gait belt Nurse Communication: Mobility status;Precautions  Activity Tolerance: Patient tolerated treatment well Patient left: in chair;with call bell/phone within reach;with chair alarm set   Time: [id number] OT Time Calculation (min): 26 min Charges:  OT General Charges $OT Visit: 1 Procedure OT Evaluation $Initial OT Evaluation Tier I: 1 Procedure OT Treatments $Self Care/Home Management : 8-22 mins G-Codes:    Nils PyleBermel, Hadyn Azer 10/16/2014, 2:51 PM

## 2014-10-16 NOTE — CV Procedure (Signed)
     TEE  CVA  Normal EF No thrombus Normal bubble study, no PFO Trace MR  Donato SchultzSKAINS, Carianna Lague, MD

## 2014-10-16 NOTE — Progress Notes (Signed)
Patient was screened by Mona Ayars for appropriateness for an Inpatient Acute Rehab consult.  At this time, we are recommending Inpatient Rehab consult.  Please order when you feel appropriate.   Brieonna Crutcher PT Inpatient Rehab Admissions Coordinator Cell [phone] Office [phone]  

## 2014-10-16 NOTE — Progress Notes (Signed)
PATIENT DETAILS Name: Cory [name] Age: 53 y.o. Sex: male Date of Birth: Aug 14, 1961 Admit Date: 10/15/2014 Admitting Physician Eduard Clos, MD PCP:No PCP Per Patient  Subjective: Essentially unchanged, continues to have dense left hemiplegia..  Assessment/Plan: Principal Problem:   Acute CVA (cerebrovascular accident): Right MCA territory infarct. EKG shows sinus rhythm. However likely probably embolic infarct. MRI brain poor quality given motion artifact, however confirms Large R MCA with mass effect on R lateral ventricle.transthoracic and trans-esophageal echocardiogram negative for any embolic source he did unable to tolerate a MRA, however carotid Doppler without any significant stenosis although very poor study because of noncooperation. Seen by speech therapy on 11/25, current recommendations are for dysphagia 2 diet with nectar thick liquids, patient educated about risk of aspiration and he is willing to proceed with oral intake. LDL elevated at 118, will start statins. A1c 6.1. Continue aspirin .Unfortunately, continues to have left hemiplegia, left facial droop.  Active Problems:   Alcohol abuse: Patient claims he drinks a fair amount of alcohol-beer, liquor and block,. Last drink on 11/23. Currently awake, alert and without any tremors. Continue Ativan per CIWA protocol. High risk for progression to delirium tremens.    Mild hyperkalemia: Unknown etiology, resolved with Kayexalate suppository  Disposition: Remain inpatient-await PT recommendations  Antibiotics:  Anti-infectives    None      DVT Prophylaxis: Prophylactic Lovenox   Code Status: Full code   Family Communication None at bedside  Procedures:  None  CONSULTS:  neurology  Time spent 40 minutes-which includes 50% of the time with face-to-face with patient/ family and coordinating care related to the above assessment and plan.  MEDICATIONS: Scheduled Meds: . antiseptic oral  rinse  7 mL Mouth Rinse q12n4p  . aspirin  300 mg Rectal Daily   Or  . aspirin  325 mg Oral Daily  . chlorhexidine  15 mL Mouth Rinse BID  . enoxaparin (LOVENOX) injection  40 mg Subcutaneous Q24H  . folic acid  1 mg Oral Daily  . LORazepam  0-4 mg Intravenous Q6H   Followed by  . [START ON 10/17/2014] LORazepam  0-4 mg Intravenous Q12H  . multivitamin with minerals  1 tablet Oral Daily  . thiamine  100 mg Oral Daily   Or  . thiamine  100 mg Intravenous Daily   Continuous Infusions:   PRN Meds:.acetaminophen, LORazepam **OR** LORazepam, senna-docusate    PHYSICAL EXAM: Vital signs in last 24 hours: Filed Vitals:   10/16/14 1012 10/16/14 1022 10/16/14 1032 10/16/14 1326  BP: 149/99 139/76 134/80 148/92  Pulse: 56 58 55 56  Temp:  98.2 F (36.8 C)  98.8 F (37.1 C)  TempSrc:  Oral  Oral  Resp: 16 18 14 16   Height:      Weight:      SpO2: 100% 100% 99% 97%    Weight change:  Filed Weights   10/15/14 0035 10/15/14 0226  Weight: 81.647 kg (180 lb) 79.289 kg (174 lb 12.8 oz)   Body mass index is 26.58 kg/(m^2).   Gen Exam: Awake and alert with clear but slow speech.  Neck: Supple, No JVD.   Chest: B/L Clear.  No rales CVS: S1 S2 Regular, no murmurs.  Abdomen: soft, BS +, non tender, non distended.  Extremities: no edema, lower extremities warm to touch. Neurologic: Left sided hemiplegia-unchanged Skin: No Rash.   Wounds: N/A.   Intake/Output from previous day:  Intake/Output Summary (Last 24 hours) at 10/16/14  1516 Last data filed at 10/16/14 0235  Gross per 24 hour  Intake      0 ml  Output    250 ml  Net   -250 ml     LAB RESULTS: CBC  Recent Labs Lab 10/15/14 0044 10/15/14 0050  WBC 5.1  --   HGB 15.6 17.7*  HCT 47.3 52.0  PLT 220  --   MCV 86.5  --   MCH 28.5  --   MCHC 33.0  --   RDW 12.4  --   LYMPHSABS 2.6  --   MONOABS 0.4  --   EOSABS 0.2  --   BASOSABS 0.0  --     Chemistries   Recent Labs Lab 10/15/14 0044 10/15/14 0050  10/16/14 0656 10/16/14 1153  NA 141 141 140 137  K 5.6* 5.5* 4.3 4.0  CL 101 104 103 100  CO2 26  --  23 24  GLUCOSE 113* 118* 89 89  BUN 8 10 15 16   CREATININE 0.93 1.10 1.00 1.10  CALCIUM 9.1  --  9.1 9.2  MG  --   --  2.0  --     CBG:  Recent Labs Lab 10/15/14 0043 10/15/14 1624  GLUCAP 97 97    GFR Estimated Creatinine Clearance: 73.4 mL/min (by C-G formula based on Cr of 1.1).  Coagulation profile  Recent Labs Lab 10/15/14 0044 10/16/14 1153  INR 1.03 1.08    Cardiac Enzymes No results for input(s): CKMB, TROPONINI, MYOGLOBIN in the last 168 hours.  Invalid input(s): CK  Invalid input(s): POCBNP No results for input(s): DDIMER in the last 72 hours.  Recent Labs  10/15/14 0044  HGBA1C 6.1*    Recent Labs  10/15/14 0044  CHOL 180  HDL 39*  LDLCALC 118*  TRIG 115  CHOLHDL 4.6    Recent Labs  10/15/14 0620  TSH 0.641   No results for input(s): VITAMINB12, FOLATE, FERRITIN, TIBC, IRON, RETICCTPCT in the last 72 hours. No results for input(s): LIPASE, AMYLASE in the last 72 hours.  Urine Studies No results for input(s): UHGB, CRYS in the last 72 hours.  Invalid input(s): UACOL, UAPR, USPG, UPH, UTP, UGL, UKET, UBIL, UNIT, UROB, ULEU, UEPI, UWBC, URBC, UBAC, CAST, UCOM, BILUA  MICROBIOLOGY: No results found for this or any previous visit (from the past 240 hour(s)).  RADIOLOGY STUDIES/RESULTS: Ct Head Wo Contrast  10/15/2014   CLINICAL DATA:  Left-sided weakness.  Code stroke.  EXAM: CT HEAD WITHOUT CONTRAST  TECHNIQUE: Contiguous axial images were obtained from the base of the skull through the vertex without intravenous contrast.  COMPARISON:  None.  FINDINGS: Skull and Sinuses:Negative for fracture or destructive process. The mastoids, middle ears, and imaged paranasal sinuses are clear.  Orbits: No acute abnormality.  Brain: Small area of low-density within the high and posterior right frontal cortex and subcortical white matter. In the  same region, there is patchy low-density within the deep cerebral white matter. Focal low-density expansion of the right caudate nucleus (anterior body); the upper right putamen is difficult to visualize. Gray-white differentiation is somewhat blurred in the right lateral temporal lobe, but this finding is indeterminate due to image noise. No hemorrhage, hydrocephalus, or shift.  Critical Value/emergent results were called by telephone at the time of interpretation on 10/15/2014 at 1:15 am to Dr. Roseanne RenoStewart, who verbally acknowledged these results.  IMPRESSION: 1. Ischemic changes in the right MCA territory affecting the frontal cortex, anterior corona radiata, and dorsal striatum. 2.  No acute intracranial hemorrhage.   Electronically Signed   By: Tiburcio Pea M.D.   On: 10/15/2014 01:22   Mr Brain Wo Contrast  10/15/2014   CLINICAL DATA:  53 year old male with left facial droop and left hemiplegia. History of alcoholism. Initial encounter.  EXAM: MRI HEAD WITHOUT CONTRAST  TECHNIQUE: Multiplanar, multiecho pulse sequences of the brain and surrounding structures were obtained without intravenous contrast.  COMPARISON:  10/15/2014 head CT.  No comparison brain MR.  FINDINGS: Examination limited with a total of four sequences. Patient was not able to complete the exam.  Large right middle cerebral artery acute infarct involving portions of the right temporal lobe, right frontal lobe, right parietal lobe, right basal ganglia and corona radiata. Mild local mass effect upon the right lateral ventricle without midline shift.  No obvious intracranial hemorrhage.  No intracranial mass lesion seen on this limited exam  Limited evaluation of the upper cervical spine and intracranial vasculature. Cerebellar tonsils may be minimally low lying but within the range of normal limits.  IMPRESSION: Motion degraded limited exam with evidence of large right hemispheric acute infarct with mild local mass effect upon the right  lateral ventricle as noted above.   Electronically Signed   By: Bridgett Larsson M.D.   On: 10/15/2014 12:46   Dg Chest Port 1 View  10/15/2014   CLINICAL DATA:  Acute right MCA distribution stroke.  EXAM: PORTABLE CHEST - 1 VIEW  COMPARISON:  None.  FINDINGS: Suboptimal inspiration accounts for crowded bronchovascular markings, especially in the bases, and accentuates the cardiac silhouette. Taking this into account, cardiomediastinal silhouette unremarkable. Lungs clear. Bronchovascular markings normal. Pulmonary vascularity normal. No visible pleural effusions. No pneumothorax.  IMPRESSION: Suboptimal inspiration.  No acute cardiopulmonary disease.   Electronically Signed   By: Hulan Saas M.D.   On: 10/15/2014 07:52   Dg Swallowing Func-speech Pathology  10/16/2014   Carolan Shiver, CCC-SLP     10/16/2014  2:55 PM Objective Swallowing Evaluation: Modified Barium Swallowing Study   Patient Details  Name: Cory [name] Date of Birth: 1961-07-28  Today's Date: 10/16/2014 Time: 1250-1314 SLP Time Calculation (min) (ACUTE ONLY): 24 min  Past Medical History:  Past Medical History  Diagnosis Date  . Medical history non-contributory    Past Surgical History:  Past Surgical History  Procedure Laterality Date  . No past surgeries     HPI:  53 y.o. male with history of alcoholism was brought to the ER  with left facial and left-sided hemiplegia. CT head showed  features consistent for CVA on the right MCA territory. CXR  Suboptimal inspiration. No acute cardiopulmonary disease.Marland Kitchen  Poor  performance on bedside swallow eval with recs for MBS.      Assessment / Plan / Recommendation Clinical Impression  Dysphagia Diagnosis: Moderate oral phase dysphagia;Mild  pharyngeal phase dysphagia Clinical impression: Pt presents with a moderate oral, mild  pharyngeal dysphagia marked by significant deficits in sensation  with copious amounts of all materials spilling from left anterior  oral caviity  without awareness.  There is also premature loss of  all POs over base of tongue, with thin liquids immediately being  aspirated without awareness.  Pharyngeal clearance of materials  was adequate with no residue post-swallow.  Pt extremely  impulsive, with difficulty waiting for instructions, following  commands, and lacking insight.  Recommend initiating a dysphagia  2 diet with nectar-thick liquids initially.  Pt will require full  supervision given cognitive deficits; will require cues  during  meal to maintain safety.      Treatment Recommendation  Therapy as outlined in treatment plan below    Diet Recommendation Dysphagia 2 (Fine chop);Nectar-thick liquid   Liquid Administration via: Cup Medication Administration: Whole meds with puree Supervision: Patient able to self feed;Full supervision/cueing  for compensatory strategies Compensations: Small sips/bites;Slow rate;Check for  pocketing;Check for anterior loss Postural Changes and/or Swallow Maneuvers: Seated upright 90  degrees    Other  Recommendations Oral Care Recommendations: Oral care BID Other Recommendations: Order thickener from pharmacy   Follow Up Recommendations   (tba)    Frequency and Duration min 3x week  2 weeks       SLP Swallow Goals     General Date of Onset: 10/15/14 HPI: 53 y.o. male with history of alcoholism was brought to the  ER with left facial and left-sided hemiplegia. CT head showed  features consistent for CVA on the right MCA territory. CXR  Suboptimal inspiration. No acute cardiopulmonary disease.Marland Kitchen.  Poor  performance on bedside swallow eval with recs for MBS.  Type of Study: Modified Barium Swallowing Study Reason for Referral: Objectively evaluate swallowing function Diet Prior to this Study: NPO Temperature Spikes Noted: No Respiratory Status: Room air History of Recent Intubation: No Behavior/Cognition: Alert;Cooperative;Pleasant mood Oral Cavity - Dentition: Poor condition;Missing dentition Oral Motor / Sensory Function:  Impaired - see Bedside swallow  eval Self-Feeding Abilities: Able to feed self Patient Positioning: Upright in chair Baseline Vocal Quality: Low vocal intensity;Clear Volitional Cough: Strong Volitional Swallow: Able to elicit Anatomy: Within functional limits Pharyngeal Secretions: Not observed secondary MBS    Reason for Referral Objectively evaluate swallowing function   Oral Phase Oral Preparation/Oral Phase Oral Phase: Impaired Oral - Solids Oral - Puree: Left anterior bolus loss;Weak lingual  manipulation;Incomplete tongue to palate contact Oral - Mechanical Soft: Weak lingual manipulation Oral Phase - Comment Oral Phase - Comment: thin and nectar bolus loss from left of  mouth without awareness   Pharyngeal Phase Pharyngeal Phase Pharyngeal Phase: Impaired Pharyngeal - Nectar Pharyngeal - Nectar Cup: Premature spillage to pyriform sinuses Pharyngeal - Thin Pharyngeal - Thin Cup: Premature spillage to pyriform  sinuses;Penetration/Aspiration during swallow;Reduced  airway/laryngeal closure;Trace aspiration Penetration/Aspiration details (thin cup): Material enters  airway, passes BELOW cords without attempt by patient to eject  out (silent aspiration) Pharyngeal - Solids Pharyngeal - Puree: Premature spillage to valleculae Pharyngeal - Regular: Premature spillage to valleculae  Cervical Esophageal Phase    Cory [name], [place] CCC/SLP Pager [phone]              Blenda MountsCouture, Cory [name] 10/16/2014, 2:54 PM     Jeoffrey MassedGHIMIRE,Cory Mckiddy, MD  [facility] Pager:[phone]  If 7PM-7AM, please contact night-coverage [email or web address] Password [email or web address] 10/16/2014, 3:16 PM   LOS: 1 day

## 2014-10-16 NOTE — Progress Notes (Signed)
Echocardiogram Echocardiogram Transesophageal has been performed.  Cory [name] 10/16/2014, 10:17 AM

## 2014-10-17 LAB — COMPLEMENT, TOTAL: Compl, Total (CH50): >60 U/mL — ABNORMAL HIGH (ref 31–60)

## 2014-10-17 MED ORDER — ACETAMINOPHEN 325 MG PO TABS
650.0000 mg | ORAL_TABLET | Freq: Four times a day (QID) | ORAL | Status: DC | PRN
  Administered 2014-10-17 (×2): 650 mg via ORAL
  Filled 2014-10-17 (×2): qty 2

## 2014-10-17 MED ORDER — HYDRALAZINE HCL 20 MG/ML IJ SOLN
10.0000 mg | Freq: Once | INTRAMUSCULAR | Status: AC
  Administered 2014-10-17: 10 mg via INTRAVENOUS
  Filled 2014-10-17: qty 1

## 2014-10-17 NOTE — Progress Notes (Signed)
PATIENT DETAILS Name: Shedrick Sarli Age: 53 y.o. Sex: male Date of Birth: 09-Jun-1961 Admit Date: 10/15/2014 Admitting Physician Eduard Clos, MD PCP:No PCP Per Patient  Subjective: Essentially unchanged. Claims to have a headache.  Assessment/Plan: Principal Problem:   Acute CVA (cerebrovascular accident):ProbablE embolic infarct. MRI brain poor quality given motion artifact, however confirms Large R MCA with mass effect on R lateral ventricle.transthoracic and trans-esophageal echocardiogram negative for any embolic source.Unable to tolerate a MRA, however carotid Doppler without any significant stenosis although very poor study because of noncooperation. Subsequently underwent a CTA of the neck and head, which showed right MCA occluded at its origin with minimal reconstitution distally. Seen by speech therapy on 11/25, current recommendations are for dysphagia 2 diet with nectar thick liquids, patient educated about risk of aspiration and he is willing to proceed with oral intake. LDL elevated at 118, will start statins. A1c 6.1. Continue aspirin .Unfortunately, continues to have left hemiplegia, left facial droop-await CIR evaluation  Active Problems:   Alcohol abuse: Patient claims he drinks a fair amount of alcohol-beer, liquor and block,. Last drink on 11/23. Currently awake, alert and without any tremors. Continue Ativan per CIWA protocol. High risk for progression to delirium tremens.    Mild hyperkalemia: Unknown etiology, resolved with Kayexalate suppository  Disposition: Remain inpatient-await CIR evaluation  Antibiotics:  Anti-infectives    None      DVT Prophylaxis: Prophylactic Lovenox   Code Status: Full code   Family Communication None at bedside  Procedures:  None  CONSULTS:  neurology  MEDICATIONS: Scheduled Meds: . antiseptic oral rinse  7 mL Mouth Rinse q12n4p  . aspirin  300 mg Rectal Daily   Or  . aspirin  325 mg Oral Daily   . atorvastatin  20 mg Oral q1800  . chlorhexidine  15 mL Mouth Rinse BID  . enoxaparin (LOVENOX) injection  40 mg Subcutaneous Q24H  . folic acid  1 mg Oral Daily  . LORazepam  0-4 mg Intravenous Q12H  . multivitamin with minerals  1 tablet Oral Daily  . thiamine  100 mg Oral Daily   Continuous Infusions:   PRN Meds:.acetaminophen, LORazepam **OR** LORazepam, senna-docusate    PHYSICAL EXAM: Vital signs in last 24 hours: Filed Vitals:   10/17/14 0203 10/17/14 0217 10/17/14 0330 10/17/14 0557  BP: 142/114  153/96 144/75  Pulse: 67  73 60  Temp:  98.2 F (36.8 C)  98.2 F (36.8 C)  TempSrc:    Oral  Resp:  18  18  Height:      Weight:      SpO2:  100%  100%    Weight change:  Filed Weights   10/15/14 0035 10/15/14 0226  Weight: 81.647 kg (180 lb) 79.289 kg (174 lb 12.8 oz)   Body mass index is 26.58 kg/(m^2).   Gen Exam: Awake and alert with clear but slow speech.   Neck: Supple, No JVD.   Chest: B/L Clear.  No rales or rhonchi CVS: S1 S2 Regular, no murmurs.  Abdomen: soft, BS +, non tender, non distended.  Extremities: no edema, lower extremities warm to touch. Neurologic: Left sided hemiplegia-unchanged Skin: No Rash.   Wounds: N/A.   Intake/Output from previous day:  Intake/Output Summary (Last 24 hours) at 10/17/14 1018 Last data filed at 10/17/14 0914  Gross per 24 hour  Intake    240 ml  Output    225 ml  Net  15 ml     LAB RESULTS: CBC  Recent Labs Lab 10/15/14 0044 10/15/14 0050  WBC 5.1  --   HGB 15.6 17.7*  HCT 47.3 52.0  PLT 220  --   MCV 86.5  --   MCH 28.5  --   MCHC 33.0  --   RDW 12.4  --   LYMPHSABS 2.6  --   MONOABS 0.4  --   EOSABS 0.2  --   BASOSABS 0.0  --     Chemistries   Recent Labs Lab 10/15/14 0044 10/15/14 0050 10/16/14 0656 10/16/14 1153  NA 141 141 140 137  K 5.6* 5.5* 4.3 4.0  CL 101 104 103 100  CO2 26  --  23 24  GLUCOSE 113* 118* 89 89  BUN 8 10 15 16   CREATININE 0.93 1.10 1.00 1.10    CALCIUM 9.1  --  9.1 9.2  MG  --   --  2.0  --     CBG:  Recent Labs Lab 10/15/14 0043 10/15/14 1624  GLUCAP 97 97    GFR Estimated Creatinine Clearance: 73.4 mL/min (by C-G formula based on Cr of 1.1).  Coagulation profile  Recent Labs Lab 10/15/14 0044 10/16/14 1153  INR 1.03 1.08    Cardiac Enzymes No results for input(s): CKMB, TROPONINI, MYOGLOBIN in the last 168 hours.  Invalid input(s): CK  Invalid input(s): POCBNP No results for input(s): DDIMER in the last 72 hours.  Recent Labs  10/15/14 0044  HGBA1C 6.1*    Recent Labs  10/15/14 0044  CHOL 180  HDL 39*  LDLCALC 118*  TRIG 115  CHOLHDL 4.6    Recent Labs  10/15/14 0620  TSH 0.641   No results for input(s): VITAMINB12, FOLATE, FERRITIN, TIBC, IRON, RETICCTPCT in the last 72 hours. No results for input(s): LIPASE, AMYLASE in the last 72 hours.  Urine Studies No results for input(s): UHGB, CRYS in the last 72 hours.  Invalid input(s): UACOL, UAPR, USPG, UPH, UTP, UGL, UKET, UBIL, UNIT, UROB, ULEU, UEPI, UWBC, URBC, UBAC, CAST, UCOM, BILUA  MICROBIOLOGY: No results found for this or any previous visit (from the past 240 hour(s)).  RADIOLOGY STUDIES/RESULTS: Ct Angio Head W/cm &/or Wo Cm  10/16/2014   ADDENDUM REPORT: 10/16/2014 18:10  ADDENDUM: Study discussed by telephone with stroke provider SHARON BIBY on 10/16/2014 at 1802 hrs.   Electronically Signed   By: Augusto GambleLee  Hall M.D.   On: 10/16/2014 18:10   10/16/2014   CLINICAL DATA:  53 year old male with left side weakness status post right MCA infarct diagnosed on recent MRI. Initial encounter.  EXAM: CT ANGIOGRAPHY HEAD AND NECK  TECHNIQUE: Multidetector CT imaging of the head and neck was performed using the standard protocol during bolus administration of intravenous contrast. Multiplanar CT image reconstructions and MIPs were obtained to evaluate the vascular anatomy. Carotid stenosis measurements (when applicable) are obtained utilizing  NASCET criteria, using the distal internal carotid diameter as the denominator.  CONTRAST:  80mL OMNIPAQUE IOHEXOL 350 MG/ML SOLN  COMPARISON:  Limited brain MRI 10/15/2014. Head CT without contrast 10/15/2014.  FINDINGS: CTA HEAD FINDINGS  Progression of cytotoxic edema in the right MCA territory, patchy in discontinuous in some places. Right caudate nucleus involvement. No associated petechial hemorrhage. Associated mass effect with partial effacement of the right lateral ventricle. Mild leftward midline shift at 2 mm is new. Left hemisphere and posterior fossa gray-white matter differentiation is stable. Patchy mostly gyriform enhancement in the right MCA territory following  contrast administration.  Stable visualized osseous structures. Visualized paranasal sinuses and mastoids are clear. Visualized orbits and scalp soft tissues are within normal limits.  VASCULAR FINDINGS:  Major intracranial venous structures are enhancing.  Mildly dominant distal left vertebral artery. Normal PICA origins. Patent vertebrobasilar junction. No basilar artery stenosis. SCA origins are normal. Fetal type PCA origins, more so the right. Bilateral PCA branches are within normal limits.  Normal left ICA siphon except for dolichoectasia. Left ophthalmic and posterior communicating artery origins are within normal limits. Patent right ICA siphon with dolichoectasia but no stenosis. Ophthalmic and right posterior communicating artery origins are within normal limits. Tortuous posterior communicating arteries, more so the right. Patent carotid termini.  Occluded right MCA at its origin. Hyperdense right MCA now evident on noncontrast images. Minimal reconstituted flow in right MCA branches.  ACA and left MCA origins are normal. Dominant left A1 segment. Anterior communicating artery within normal limits. Tortuous proximal ACAs. Visualized bilateral ACA branches otherwise within normal limits. Left MCA branches within normal limits.   Review of the MIP images confirms the above findings.  CTA NECK FINDINGS  Negative lung apices. No superior mediastinal lymphadenopathy. Thyroid, larynx, pharynx, parapharyngeal spaces, retropharyngeal space, sublingual space, submandibular glands, and parotid glands are within normal limits. No cervical lymphadenopathy. No acute osseous abnormality identified.  VASCULAR FINDINGS: Bovine arch configuration. No arch atherosclerosis or great vessel origin stenosis.  No right CCA origin stenosis. Normal right carotid bifurcation. Tortuous but otherwise negative cervical right ICA.  No proximal right subclavian artery stenosis. Normal right vertebral artery origin. Mildly non dominant right vertebral artery is normal to the skullbase.  No left CCA origin stenosis. Tortuous proximal left CCA. Normal left carotid bifurcation. Tortuous but otherwise negative cervical left ICA.  No proximal left subclavian artery stenosis. Normal left vertebral artery origin. Intermittently tortuous but otherwise normal left vertebral artery to the skullbase.  Review of the MIP images confirms the above findings.  IMPRESSION: 1. 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 2. Intracranial CTA otherwise negative except for dolichoectasia. 3. Negative neck CTA except for tortuous ICAs. No right carotid plaque or stenosis identified. 4. Expected evolution of overall moderate size right MCA infarct. Mass effect with mild leftward midline shift (2 mm). No hemorrhagic transformation.  Electronically Signed: By: Augusto Gamble M.D. On: 10/16/2014 17:57   Ct Head Wo Contrast  10/15/2014   CLINICAL DATA:  Left-sided weakness.  Code stroke.  EXAM: CT HEAD WITHOUT CONTRAST  TECHNIQUE: Contiguous axial images were obtained from the base of the skull through the vertex without intravenous contrast.  COMPARISON:  None.  FINDINGS: Skull and Sinuses:Negative for fracture or destructive process. The mastoids, middle ears, and  imaged paranasal sinuses are clear.  Orbits: No acute abnormality.  Brain: Small area of low-density within the high and posterior right frontal cortex and subcortical white matter. In the same region, there is patchy low-density within the deep cerebral white matter. Focal low-density expansion of the right caudate nucleus (anterior body); the upper right putamen is difficult to visualize. Gray-white differentiation is somewhat blurred in the right lateral temporal lobe, but this finding is indeterminate due to image noise. No hemorrhage, hydrocephalus, or shift.  Critical Value/emergent results were called by telephone at the time of interpretation on 10/15/2014 at 1:15 am to Dr. Roseanne Reno, who verbally acknowledged these results.  IMPRESSION: 1. Ischemic changes in the right MCA territory affecting the frontal cortex, anterior corona radiata, and dorsal striatum. 2. No  acute intracranial hemorrhage.   Electronically Signed   By: Tiburcio PeaJonathan  Watts M.D.   On: 10/15/2014 01:22   Ct Angio Neck W/cm &/or Wo/cm  10/16/2014   ADDENDUM REPORT: 10/16/2014 18:10  ADDENDUM: Study discussed by telephone with stroke provider SHARON BIBY on 10/16/2014 at 1802 hrs.   Electronically Signed   By: Augusto GambleLee  Hall M.D.   On: 10/16/2014 18:10   10/16/2014   CLINICAL DATA:  53 year old male with left side weakness status post right MCA infarct diagnosed on recent MRI. Initial encounter.  EXAM: CT ANGIOGRAPHY HEAD AND NECK  TECHNIQUE: Multidetector CT imaging of the head and neck was performed using the standard protocol during bolus administration of intravenous contrast. Multiplanar CT image reconstructions and MIPs were obtained to evaluate the vascular anatomy. Carotid stenosis measurements (when applicable) are obtained utilizing NASCET criteria, using the distal internal carotid diameter as the denominator.  CONTRAST:  80mL OMNIPAQUE IOHEXOL 350 MG/ML SOLN  COMPARISON:  Limited brain MRI 10/15/2014. Head CT without contrast  10/15/2014.  FINDINGS: CTA HEAD FINDINGS  Progression of cytotoxic edema in the right MCA territory, patchy in discontinuous in some places. Right caudate nucleus involvement. No associated petechial hemorrhage. Associated mass effect with partial effacement of the right lateral ventricle. Mild leftward midline shift at 2 mm is new. Left hemisphere and posterior fossa gray-white matter differentiation is stable. Patchy mostly gyriform enhancement in the right MCA territory following contrast administration.  Stable visualized osseous structures. Visualized paranasal sinuses and mastoids are clear. Visualized orbits and scalp soft tissues are within normal limits.  VASCULAR FINDINGS:  Major intracranial venous structures are enhancing.  Mildly dominant distal left vertebral artery. Normal PICA origins. Patent vertebrobasilar junction. No basilar artery stenosis. SCA origins are normal. Fetal type PCA origins, more so the right. Bilateral PCA branches are within normal limits.  Normal left ICA siphon except for dolichoectasia. Left ophthalmic and posterior communicating artery origins are within normal limits. Patent right ICA siphon with dolichoectasia but no stenosis. Ophthalmic and right posterior communicating artery origins are within normal limits. Tortuous posterior communicating arteries, more so the right. Patent carotid termini.  Occluded right MCA at its origin. Hyperdense right MCA now evident on noncontrast images. Minimal reconstituted flow in right MCA branches.  ACA and left MCA origins are normal. Dominant left A1 segment. Anterior communicating artery within normal limits. Tortuous proximal ACAs. Visualized bilateral ACA branches otherwise within normal limits. Left MCA branches within normal limits.  Review of the MIP images confirms the above findings.  CTA NECK FINDINGS  Negative lung apices. No superior mediastinal lymphadenopathy. Thyroid, larynx, pharynx, parapharyngeal spaces, retropharyngeal  space, sublingual space, submandibular glands, and parotid glands are within normal limits. No cervical lymphadenopathy. No acute osseous abnormality identified.  VASCULAR FINDINGS: Bovine arch configuration. No arch atherosclerosis or great vessel origin stenosis.  No right CCA origin stenosis. Normal right carotid bifurcation. Tortuous but otherwise negative cervical right ICA.  No proximal right subclavian artery stenosis. Normal right vertebral artery origin. Mildly non dominant right vertebral artery is normal to the skullbase.  No left CCA origin stenosis. Tortuous proximal left CCA. Normal left carotid bifurcation. Tortuous but otherwise negative cervical left ICA.  No proximal left subclavian artery stenosis. Normal left vertebral artery origin. Intermittently tortuous but otherwise normal left vertebral artery to the skullbase.  Review of the MIP images confirms the above findings.  IMPRESSION: 1. 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 2. Intracranial CTA otherwise negative except for dolichoectasia.  3. Negative neck CTA except for tortuous ICAs. No right carotid plaque or stenosis identified. 4. Expected evolution of overall moderate size right MCA infarct. Mass effect with mild leftward midline shift (2 mm). No hemorrhagic transformation.  Electronically Signed: By: Augusto Gamble M.D. On: 10/16/2014 17:57   Mr Brain Wo Contrast  10/15/2014   CLINICAL DATA:  53 year old male with left facial droop and left hemiplegia. History of alcoholism. Initial encounter.  EXAM: MRI HEAD WITHOUT CONTRAST  TECHNIQUE: Multiplanar, multiecho pulse sequences of the brain and surrounding structures were obtained without intravenous contrast.  COMPARISON:  10/15/2014 head CT.  No comparison brain MR.  FINDINGS: Examination limited with a total of four sequences. Patient was not able to complete the exam.  Large right middle cerebral artery acute infarct involving portions of the right  temporal lobe, right frontal lobe, right parietal lobe, right basal ganglia and corona radiata. Mild local mass effect upon the right lateral ventricle without midline shift.  No obvious intracranial hemorrhage.  No intracranial mass lesion seen on this limited exam  Limited evaluation of the upper cervical spine and intracranial vasculature. Cerebellar tonsils may be minimally low lying but within the range of normal limits.  IMPRESSION: Motion degraded limited exam with evidence of large right hemispheric acute infarct with mild local mass effect upon the right lateral ventricle as noted above.   Electronically Signed   By: Bridgett Larsson M.D.   On: 10/15/2014 12:46   Dg Chest Port 1 View  10/15/2014   CLINICAL DATA:  Acute right MCA distribution stroke.  EXAM: PORTABLE CHEST - 1 VIEW  COMPARISON:  None.  FINDINGS: Suboptimal inspiration accounts for crowded bronchovascular markings, especially in the bases, and accentuates the cardiac silhouette. Taking this into account, cardiomediastinal silhouette unremarkable. Lungs clear. Bronchovascular markings normal. Pulmonary vascularity normal. No visible pleural effusions. No pneumothorax.  IMPRESSION: Suboptimal inspiration.  No acute cardiopulmonary disease.   Electronically Signed   By: Hulan Saas M.D.   On: 10/15/2014 07:52   Dg Swallowing Func-speech Pathology  10/16/2014   Carolan Shiver, CCC-SLP     10/16/2014  2:55 PM Objective Swallowing Evaluation: Modified Barium Swallowing Study   Patient Details  Name: Argel Pablo MRN: [id number] Date of Birth: [birth year]  Today's Date: 10/16/2014 Time: 1250-1314 SLP Time Calculation (min) (ACUTE ONLY): 24 min  Past Medical History:  Past Medical History  Diagnosis Date  . Medical history non-contributory    Past Surgical History:  Past Surgical History  Procedure Laterality Date  . No past surgeries     HPI:  53 y.o. male with history of alcoholism was brought to the ER  with left facial and  left-sided hemiplegia. CT head showed  features consistent for CVA on the right MCA territory. CXR  Suboptimal inspiration. No acute cardiopulmonary disease.Marland Kitchen  Poor  performance on bedside swallow eval with recs for MBS.      Assessment / Plan / Recommendation Clinical Impression  Dysphagia Diagnosis: Moderate oral phase dysphagia;Mild  pharyngeal phase dysphagia Clinical impression: Pt presents with a moderate oral, mild  pharyngeal dysphagia marked by significant deficits in sensation  with copious amounts of all materials spilling from left anterior  oral caviity without awareness.  There is also premature loss of  all POs over base of tongue, with thin liquids immediately being  aspirated without awareness.  Pharyngeal clearance of materials  was adequate with no residue post-swallow.  Pt extremely  impulsive, with difficulty waiting for instructions, following  commands,  and lacking insight.  Recommend initiating a dysphagia  2 diet with nectar-thick liquids initially.  Pt will require full  supervision given cognitive deficits; will require cues during  meal to maintain safety.      Treatment Recommendation  Therapy as outlined in treatment plan below    Diet Recommendation Dysphagia 2 (Fine chop);Nectar-thick liquid   Liquid Administration via: Cup Medication Administration: Whole meds with puree Supervision: Patient able to self feed;Full supervision/cueing  for compensatory strategies Compensations: Small sips/bites;Slow rate;Check for  pocketing;Check for anterior loss Postural Changes and/or Swallow Maneuvers: Seated upright 90  degrees    Other  Recommendations Oral Care Recommendations: Oral care BID Other Recommendations: Order thickener from pharmacy   Follow Up Recommendations   (tba)    Frequency and Duration min 3x week  2 weeks       SLP Swallow Goals     General Date of Onset: 10/15/14 HPI: 53 y.o. male with history of alcoholism was brought to the  ER with left facial and left-sided hemiplegia.  CT head showed  features consistent for CVA on the right MCA territory. CXR  Suboptimal inspiration. No acute cardiopulmonary disease.Marland Kitchen  Poor  performance on bedside swallow eval with recs for MBS.  Type of Study: Modified Barium Swallowing Study Reason for Referral: Objectively evaluate swallowing function Diet Prior to this Study: NPO Temperature Spikes Noted: No Respiratory Status: Room air History of Recent Intubation: No Behavior/Cognition: Alert;Cooperative;Pleasant mood Oral Cavity - Dentition: Poor condition;Missing dentition Oral Motor / Sensory Function: Impaired - see Bedside swallow  eval Self-Feeding Abilities: Able to feed self Patient Positioning: Upright in chair Baseline Vocal Quality: Low vocal intensity;Clear Volitional Cough: Strong Volitional Swallow: Able to elicit Anatomy: Within functional limits Pharyngeal Secretions: Not observed secondary MBS    Reason for Referral Objectively evaluate swallowing function   Oral Phase Oral Preparation/Oral Phase Oral Phase: Impaired Oral - Solids Oral - Puree: Left anterior bolus loss;Weak lingual  manipulation;Incomplete tongue to palate contact Oral - Mechanical Soft: Weak lingual manipulation Oral Phase - Comment Oral Phase - Comment: thin and nectar bolus loss from left of  mouth without awareness   Pharyngeal Phase Pharyngeal Phase Pharyngeal Phase: Impaired Pharyngeal - Nectar Pharyngeal - Nectar Cup: Premature spillage to pyriform sinuses Pharyngeal - Thin Pharyngeal - Thin Cup: Premature spillage to pyriform  sinuses;Penetration/Aspiration during swallow;Reduced  airway/laryngeal closure;Trace aspiration Penetration/Aspiration details (thin cup): Material enters  airway, passes BELOW cords without attempt by patient to eject  out (silent aspiration) Pharyngeal - Solids Pharyngeal - Puree: Premature spillage to valleculae Pharyngeal - Regular: Premature spillage to valleculae  Cervical Esophageal Phase    Amanda L. Samson Frederic, [place] CCC/SLP Pager  [phone]              Blenda Mounts Laurice 10/16/2014, 2:54 PM     Jeoffrey Massed, MD  [facility] Pager:[phone]  If 7PM-7AM, please contact night-coverage [email or web address] Password [email or web address] 10/17/2014, 10:18 AM   LOS: 2 days

## 2014-10-17 NOTE — Progress Notes (Signed)
STROKE TEAM PROGRESS NOTE   HISTORY Cory [name] is an 53 y.o. male history of hypertension and hyperlipidemia with noncompliance with treatment, presenting with new onset slurred speech, left facial droop and left hemiplegia. She has no previous history of stroke nor TIA. He was last seen well by his girlfriend on the morning of 10/14/2014. She noticed that he was sleeping more than usual this afternoon. Patient is known to drink alcohol heavily. He was brought to the emergency room by EMS and code stroke was activated. CT scan of his head showed changes involving the right MCA territory consistent with large acute ischemic infarction. NIH stroke score was 16. Patient was not administered TPA secondary to beyond time under for treatment consideration, and ischemic changes on CT scan. He was admitted for further evaluation and treatment.   SUBJECTIVE (INTERVAL HISTORY)  .Overall he feels his condition is stable.  CT angio shows right hyperdense M 1  With occlusion   OBJECTIVE Temp:  [98.2 F (36.8 C)-98.4 F (36.9 C)] 98.4 F (36.9 C) (11/26 1700) Pulse Rate:  [60-84] 84 (11/26 1700) Cardiac Rhythm:  [-] Normal sinus rhythm (11/26 2000) Resp:  [17-20] 17 (11/26 1700) BP: (127-153)/(75-114) 127/83 mmHg (11/26 1700) SpO2:  [99 %-100 %] 99 % (11/26 1700)   Recent Labs Lab 10/15/14 0043 10/15/14 1624  GLUCAP 97 97    Recent Labs Lab 10/15/14 0044 10/15/14 0050 10/16/14 0656 10/16/14 1153  NA 141 141 140 137  K 5.6* 5.5* 4.3 4.0  CL 101 104 103 100  CO2 26  --  23 24  GLUCOSE 113* 118* 89 89  BUN _0 CREATININE 0.93 1.10 1.00 1.10  CALCIUM 9.1  --  9.1 9.2  MG  --   --  2.0  --   PHOS  --   --  3.3  --     Recent Labs Lab 10/15/14 0044  AST 24  ALT 13  ALKPHOS 58  BILITOT 0.3  PROT 8.5*  ALBUMIN 3.6    Recent Labs Lab 10/15/14 0044 10/15/14 0050  WBC 5.1  --   NEUTROABS 2.0  --   HGB 15.6 17.7*  HCT 47.3 52.0  MCV 86.5  --   PLT 220  --     No results for input(s): CKTOTAL, CKMB, CKMBINDEX, TROPONINI in the last 168 hours.  Recent Labs  10/15/14 0044 10/16/14 1153  LABPROT 13.7 14.1  INR 1.03 1.08    Recent Labs  10/15/14 0056  COLORURINE YELLOW  LABSPEC 1.019  PHURINE 6.0  GLUCOSEU NEGATIVE  HGBUR NEGATIVE  BILIRUBINUR NEGATIVE  KETONESUR NEGATIVE  PROTEINUR NEGATIVE  UROBILINOGEN 0.2  NITRITE NEGATIVE  LEUKOCYTESUR NEGATIVE       Component Value Date/Time   CHOL 180 10/15/2014 0044   TRIG 115 10/15/2014 0044   HDL 39* 10/15/2014 0044   CHOLHDL 4.6 10/15/2014 0044   VLDL 23 10/15/2014 0044   LDLCALC 118* 10/15/2014 0044   Lab Results  Component Value Date   HGBA1C 6.1* 10/15/2014      Component Value Date/Time   LABOPIA NONE DETECTED 10/15/2014 0056   COCAINSCRNUR NONE DETECTED 10/15/2014 0056   LABBENZ NONE DETECTED 10/15/2014 0056   AMPHETMU NONE DETECTED 10/15/2014 0056   THCU NONE DETECTED 10/15/2014 0056   LABBARB NONE DETECTED 10/15/2014 0056     Recent Labs Lab 10/15/14 0044  ETH 103*    Ct Angio Head W/cm &/or Wo Cm  10/16/2014   ADDENDUM REPORT: 10/16/2014 18:10  ADDENDUM: Study discussed by telephone with stroke provider Cory [name] on 10/16/2014 at 1802 hrs.   Electronically Signed   By: Lars Pinks M.D.   On: 10/16/2014 18:10   10/16/2014   CLINICAL DATA:  53 year old male with left side weakness status post right MCA infarct diagnosed on recent MRI. Initial encounter.  EXAM: CT ANGIOGRAPHY HEAD AND NECK  TECHNIQUE: Multidetector CT imaging of the head and neck was performed using the standard protocol during bolus administration of intravenous contrast. Multiplanar CT image reconstructions and MIPs were obtained to evaluate the vascular anatomy. Carotid stenosis measurements (when applicable) are obtained utilizing NASCET criteria, using the distal internal carotid diameter as the denominator.  CONTRAST:  45m OMNIPAQUE IOHEXOL 350 MG/ML SOLN  COMPARISON:  Limited brain MRI  10/15/2014. Head CT without contrast 10/15/2014.  FINDINGS: CTA HEAD FINDINGS  Progression of cytotoxic edema in the right MCA territory, patchy in discontinuous in some places. Right caudate nucleus involvement. No associated petechial hemorrhage. Associated mass effect with partial effacement of the right lateral ventricle. Mild leftward midline shift at 2 mm is new. Left hemisphere and posterior fossa gray-white matter differentiation is stable. Patchy mostly gyriform enhancement in the right MCA territory following contrast administration.  Stable visualized osseous structures. Visualized paranasal sinuses and mastoids are clear. Visualized orbits and scalp soft tissues are within normal limits.  VASCULAR FINDINGS:  Major intracranial venous structures are enhancing.  Mildly dominant distal left vertebral artery. Normal PICA origins. Patent vertebrobasilar junction. No basilar artery stenosis. SCA origins are normal. Fetal type PCA origins, more so the right. Bilateral PCA branches are within normal limits.  Normal left ICA siphon except for dolichoectasia. Left ophthalmic and posterior communicating artery origins are within normal limits. Patent right ICA siphon with dolichoectasia but no stenosis. Ophthalmic and right posterior communicating artery origins are within normal limits. Tortuous posterior communicating arteries, more so the right. Patent carotid termini.  Occluded right MCA at its origin. Hyperdense right MCA now evident on noncontrast images. Minimal reconstituted flow in right MCA branches.  ACA and left MCA origins are normal. Dominant left A1 segment. Anterior communicating artery within normal limits. Tortuous proximal ACAs. Visualized bilateral ACA branches otherwise within normal limits. Left MCA branches within normal limits.  Review of the MIP images confirms the above findings.  CTA NECK FINDINGS  Negative lung apices. No superior mediastinal lymphadenopathy. Thyroid, larynx, pharynx,  parapharyngeal spaces, retropharyngeal space, sublingual space, submandibular glands, and parotid glands are within normal limits. No cervical lymphadenopathy. No acute osseous abnormality identified.  VASCULAR FINDINGS: Bovine arch configuration. No arch atherosclerosis or great vessel origin stenosis.  No right CCA origin stenosis. Normal right carotid bifurcation. Tortuous but otherwise negative cervical right ICA.  No proximal right subclavian artery stenosis. Normal right vertebral artery origin. Mildly non dominant right vertebral artery is normal to the skullbase.  No left CCA origin stenosis. Tortuous proximal left CCA. Normal left carotid bifurcation. Tortuous but otherwise negative cervical left ICA.  No proximal left subclavian artery stenosis. Normal left vertebral artery origin. Intermittently tortuous but otherwise normal left vertebral artery to the skullbase.  Review of the MIP images confirms the above findings.  IMPRESSION: 1. 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 2. Intracranial CTA otherwise negative except for dolichoectasia. 3. Negative neck CTA except for tortuous ICAs. No right carotid plaque or stenosis identified. 4. Expected evolution of overall moderate size right MCA infarct. Mass effect with mild leftward midline shift (2 mm). No hemorrhagic  transformation.  Electronically Signed: By: Lars Pinks M.D. On: 10/16/2014 17:57   Ct Angio Neck W/cm &/or Wo/cm  10/16/2014   ADDENDUM REPORT: 10/16/2014 18:10  ADDENDUM: Study discussed by telephone with stroke provider Cory [name] on 10/16/2014 at 1802 hrs.   Electronically Signed   By: Lars Pinks M.D.   On: 10/16/2014 18:10   10/16/2014   CLINICAL DATA:  53 year old male with left side weakness status post right MCA infarct diagnosed on recent MRI. Initial encounter.  EXAM: CT ANGIOGRAPHY HEAD AND NECK  TECHNIQUE: Multidetector CT imaging of the head and neck was performed using the standard protocol  during bolus administration of intravenous contrast. Multiplanar CT image reconstructions and MIPs were obtained to evaluate the vascular anatomy. Carotid stenosis measurements (when applicable) are obtained utilizing NASCET criteria, using the distal internal carotid diameter as the denominator.  CONTRAST:  1m OMNIPAQUE IOHEXOL 350 MG/ML SOLN  COMPARISON:  Limited brain MRI 10/15/2014. Head CT without contrast 10/15/2014.  FINDINGS: CTA HEAD FINDINGS  Progression of cytotoxic edema in the right MCA territory, patchy in discontinuous in some places. Right caudate nucleus involvement. No associated petechial hemorrhage. Associated mass effect with partial effacement of the right lateral ventricle. Mild leftward midline shift at 2 mm is new. Left hemisphere and posterior fossa gray-white matter differentiation is stable. Patchy mostly gyriform enhancement in the right MCA territory following contrast administration.  Stable visualized osseous structures. Visualized paranasal sinuses and mastoids are clear. Visualized orbits and scalp soft tissues are within normal limits.  VASCULAR FINDINGS:  Major intracranial venous structures are enhancing.  Mildly dominant distal left vertebral artery. Normal PICA origins. Patent vertebrobasilar junction. No basilar artery stenosis. SCA origins are normal. Fetal type PCA origins, more so the right. Bilateral PCA branches are within normal limits.  Normal left ICA siphon except for dolichoectasia. Left ophthalmic and posterior communicating artery origins are within normal limits. Patent right ICA siphon with dolichoectasia but no stenosis. Ophthalmic and right posterior communicating artery origins are within normal limits. Tortuous posterior communicating arteries, more so the right. Patent carotid termini.  Occluded right MCA at its origin. Hyperdense right MCA now evident on noncontrast images. Minimal reconstituted flow in right MCA branches.  ACA and left MCA origins are  normal. Dominant left A1 segment. Anterior communicating artery within normal limits. Tortuous proximal ACAs. Visualized bilateral ACA branches otherwise within normal limits. Left MCA branches within normal limits.  Review of the MIP images confirms the above findings.  CTA NECK FINDINGS  Negative lung apices. No superior mediastinal lymphadenopathy. Thyroid, larynx, pharynx, parapharyngeal spaces, retropharyngeal space, sublingual space, submandibular glands, and parotid glands are within normal limits. No cervical lymphadenopathy. No acute osseous abnormality identified.  VASCULAR FINDINGS: Bovine arch configuration. No arch atherosclerosis or great vessel origin stenosis.  No right CCA origin stenosis. Normal right carotid bifurcation. Tortuous but otherwise negative cervical right ICA.  No proximal right subclavian artery stenosis. Normal right vertebral artery origin. Mildly non dominant right vertebral artery is normal to the skullbase.  No left CCA origin stenosis. Tortuous proximal left CCA. Normal left carotid bifurcation. Tortuous but otherwise negative cervical left ICA.  No proximal left subclavian artery stenosis. Normal left vertebral artery origin. Intermittently tortuous but otherwise normal left vertebral artery to the skullbase.  Review of the MIP images confirms the above findings.  IMPRESSION: 1. 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 2. Intracranial CTA otherwise negative except for dolichoectasia. 3. Negative neck CTA except for tortuous  ICAs. No right carotid plaque or stenosis identified. 4. Expected evolution of overall moderate size right MCA infarct. Mass effect with mild leftward midline shift (2 mm). No hemorrhagic transformation.  Electronically Signed: By: Lars Pinks M.D. On: 10/16/2014 17:57   Dg Swallowing Func-speech Pathology  10/16/2014   Assunta Curtis, CCC-SLP     10/16/2014  2:55 PM Objective Swallowing Evaluation: Modified  Barium Swallowing Study   Patient Details  Name: Cory [name] MRN: [id number] Date of Birth: 1961/03/16  Today's Date: 10/16/2014 Time: 1250-1314 SLP Time Calculation (min) (ACUTE ONLY): 24 min  Past Medical History:  Past Medical History  Diagnosis Date  . Medical history non-contributory    Past Surgical History:  Past Surgical History  Procedure Laterality Date  . No past surgeries     HPI:  53 y.o. male with history of alcoholism was brought to the ER  with left facial and left-sided hemiplegia. CT head showed  features consistent for CVA on the right MCA territory. CXR  Suboptimal inspiration. No acute cardiopulmonary disease.Marland Kitchen  Poor  performance on bedside swallow eval with recs for MBS.      Assessment / Plan / Recommendation Clinical Impression  Dysphagia Diagnosis: Moderate oral phase dysphagia;Mild  pharyngeal phase dysphagia Clinical impression: Pt presents with a moderate oral, mild  pharyngeal dysphagia marked by significant deficits in sensation  with copious amounts of all materials spilling from left anterior  oral caviity without awareness.  There is also premature loss of  all POs over base of tongue, with thin liquids immediately being  aspirated without awareness.  Pharyngeal clearance of materials  was adequate with no residue post-swallow.  Pt extremely  impulsive, with difficulty waiting for instructions, following  commands, and lacking insight.  Recommend initiating a dysphagia  2 diet with nectar-thick liquids initially.  Pt will require full  supervision given cognitive deficits; will require cues during  meal to maintain safety.      Treatment Recommendation  Therapy as outlined in treatment plan below    Diet Recommendation Dysphagia 2 (Fine chop);Nectar-thick liquid   Liquid Administration via: Cup Medication Administration: Whole meds with puree Supervision: Patient able to self feed;Full supervision/cueing  for compensatory strategies Compensations: Small sips/bites;Slow  rate;Check for  pocketing;Check for anterior loss Postural Changes and/or Swallow Maneuvers: Seated upright 90  degrees    Other  Recommendations Oral Care Recommendations: Oral care BID Other Recommendations: Order thickener from pharmacy   Follow Up Recommendations   (tba)    Frequency and Duration min 3x week  2 weeks       SLP Swallow Goals     General Date of Onset: 10/15/14 HPI: 53 y.o. male with history of alcoholism was brought to the  ER with left facial and left-sided hemiplegia. CT head showed  features consistent for CVA on the right MCA territory. CXR  Suboptimal inspiration. No acute cardiopulmonary disease.Marland Kitchen  Poor  performance on bedside swallow eval with recs for MBS.  Type of Study: Modified Barium Swallowing Study Reason for Referral: Objectively evaluate swallowing function Diet Prior to this Study: NPO Temperature Spikes Noted: No Respiratory Status: Room air History of Recent Intubation: No Behavior/Cognition: Alert;Cooperative;Pleasant mood Oral Cavity - Dentition: Poor condition;Missing dentition Oral Motor / Sensory Function: Impaired - see Bedside swallow  eval Self-Feeding Abilities: Able to feed self Patient Positioning: Upright in chair Baseline Vocal Quality: Low vocal intensity;Clear Volitional Cough: Strong Volitional Swallow: Able to elicit Anatomy: Within functional limits Pharyngeal Secretions: Not observed secondary MBS  Reason for Referral Objectively evaluate swallowing function   Oral Phase Oral Preparation/Oral Phase Oral Phase: Impaired Oral - Solids Oral - Puree: Left anterior bolus loss;Weak lingual  manipulation;Incomplete tongue to palate contact Oral - Mechanical Soft: Weak lingual manipulation Oral Phase - Comment Oral Phase - Comment: thin and nectar bolus loss from left of  mouth without awareness   Pharyngeal Phase Pharyngeal Phase Pharyngeal Phase: Impaired Pharyngeal - Nectar Pharyngeal - Nectar Cup: Premature spillage to pyriform sinuses Pharyngeal - Thin  Pharyngeal - Thin Cup: Premature spillage to pyriform  sinuses;Penetration/Aspiration during swallow;Reduced  airway/laryngeal closure;Trace aspiration Penetration/Aspiration details (thin cup): Material enters  airway, passes BELOW cords without attempt by patient to eject  out (silent aspiration) Pharyngeal - Solids Pharyngeal - Puree: Premature spillage to valleculae Pharyngeal - Regular: Premature spillage to valleculae  Cervical Esophageal Phase    Amanda L. Tivis Ringer, [place] CCC/SLP Pager [phone]              Juan Quam Laurice 10/16/2014, 2:54 PM      PHYSICAL EXAM Pleasant middle aged african [place] male not in distress. . Afebrile. Head is nontraumatic. Neck is supple without bruit. Hearing is normal. Cardiac exam no murmur or gallop. Lungs are clear to auscultation. Distal pulses are well felt. Neurological Exam : Awake alert follows commands well. No dysarthria. No aphasia. Right gaze preference but able to look to the left was midline. Fundi were not visualized. Dense left homonymous hemianopsia. Mild left lower facial weakness. Tongue deviates to the left. Dense left hemiplegia with 0/5 left upper extremity and 1/5 left lower extremity strength. Diminished left hemi-sensation. No left hemineglect   Good strength sensation and coordination on the right side. Right plantar downgoing. Left downgoing. Gait was not tested. ASSESSMENT/PLAN Cory [name] is a 53 y.o. male with history of hypertension and hyperlipidemia with noncompliance with treatment and heavy etoh use presenting with new onset slurred speech, left facial droop and left hemiplegia. He did not receive IV t-PA due to delay in arrival.   Stroke:  Non-dominant large right MCA infarct, embolic secondary to unknown source  Resultant  Left hemiparesis  MRI  Large R MCA with mass effect on R lateral ventricle  MRA  Unable to tolerate-will check CTA brain  Carotid Doppler  1-39% right internal carotid artery  stenosis. Unable to visualize the left internal carotid and bilateral vertebral arteries due to poor patient cooperation.   2D Echo  Left ventricle: The cavity size was normal. Wall thickness was increased in a pattern of mild LVH. Systolic function was normal. The estimated ejection fraction was in the range of 55% to 60%. Wall motion was normal; there were no regional wall motion abnormalities.  HgbA1c 6.1  UDS negative  TEE to look for embolic source. Arranged with [facility] for tomorrow or Friday, based on their schedule.  If positive for PFO (patent foramen ovale), check bilateral lower extremity venous dopplers to rule out DVT as possible source of stroke. (I have made patient NPO after midnight tonight in case they can do tomorrow).   Given his young age, will check hypercoagulable tests (lupus anticoagulant, cardiolipin antibody) and vasculitic labs (C3, C4, CH50, ANA, ESR) as well as HIV and RPR as possible stroke sources.  Lovenox 40 mg sq daily for VTE prophylaxis  DIET DYS 2 . ST to assess.  no antithrombotics prior to admission, now on aspirin 300 mg suppository daily  Ongoing aggressive stroke risk factor management  Therapy recommendations:  rehab  Disposition:  rehab  Hyperlipidemia  LDL 118, goal < 70  Add statin once able to swallow  Continue statin at discharge  Other Stroke Risk Factors  Former Cigarette smoker, stopped smoking per record  ETOH abuse, heavy, placed on CIWA protocol  Other Active Problems  hyperkalemia  Hospital day # 2  Burnetta Sabin, MSN, APRN, ANVP-BC, AGPCNP-BC [facility] Pager: [phone] 10/17/2014 11:11 PM  I have personally examined this patient, reviewed notes, independently viewed imaging studies, participated in medical decision making and plan of care. I have made any additions or clarifications directly to the above note. Agree with note above. Antony Contras,  MD Medical Director [facility] Pager: [phone] 10/17/2014 11:11 PM    To contact Stroke Continuity provider, please refer to [email or web address] After hours, contact General Neurology

## 2014-10-18 ENCOUNTER — Encounter (HOSPITAL_COMMUNITY): Payer: Self-pay | Admitting: Cardiology

## 2014-10-18 ENCOUNTER — Ambulatory Visit: Payer: Self-pay | Admitting: *Deleted

## 2014-10-18 ENCOUNTER — Encounter (HOSPITAL_COMMUNITY): Payer: Self-pay | Admitting: *Deleted

## 2014-10-18 ENCOUNTER — Encounter (HOSPITAL_COMMUNITY)
Admission: EM | Disposition: A | Discharge status: Rehabilitation Facility | Payer: Self-pay | Source: Home / Self Care | Attending: Internal Medicine

## 2014-10-18 ENCOUNTER — Inpatient Hospital Stay (HOSPITAL_COMMUNITY)
Admission: RE | Admit: 2014-10-18 | Discharge: 2014-11-28 | DRG: 057 | Disposition: A | Discharge status: Home Health | Payer: Medicaid Other | Source: Intra-hospital | Attending: Physical Medicine & Rehabilitation | Admitting: Physical Medicine & Rehabilitation

## 2014-10-18 DIAGNOSIS — E875 Hyperkalemia: Secondary | ICD-10-CM

## 2014-10-18 DIAGNOSIS — R1312 Dysphagia, oropharyngeal phase: Secondary | ICD-10-CM | POA: Diagnosis not present

## 2014-10-18 DIAGNOSIS — Z9114 Patient's other noncompliance with medication regimen: Secondary | ICD-10-CM | POA: Diagnosis present

## 2014-10-18 DIAGNOSIS — M25579 Pain in unspecified ankle and joints of unspecified foot: Secondary | ICD-10-CM

## 2014-10-18 DIAGNOSIS — R414 Neurologic neglect syndrome: Secondary | ICD-10-CM | POA: Diagnosis not present

## 2014-10-18 DIAGNOSIS — E785 Hyperlipidemia, unspecified: Secondary | ICD-10-CM | POA: Diagnosis not present

## 2014-10-18 DIAGNOSIS — I69828 Other speech and language deficits following other cerebrovascular disease: Secondary | ICD-10-CM

## 2014-10-18 DIAGNOSIS — I639 Cerebral infarction, unspecified: Secondary | ICD-10-CM

## 2014-10-18 DIAGNOSIS — I69854 Hemiplegia and hemiparesis following other cerebrovascular disease affecting left non-dominant side: Secondary | ICD-10-CM | POA: Diagnosis present

## 2014-10-18 DIAGNOSIS — S82899D Other fracture of unspecified lower leg, subsequent encounter for closed fracture with routine healing: Secondary | ICD-10-CM

## 2014-10-18 DIAGNOSIS — I69891 Dysphagia following other cerebrovascular disease: Secondary | ICD-10-CM | POA: Diagnosis not present

## 2014-10-18 DIAGNOSIS — F17220 Nicotine dependence, chewing tobacco, uncomplicated: Secondary | ICD-10-CM | POA: Diagnosis not present

## 2014-10-18 DIAGNOSIS — I10 Essential (primary) hypertension: Secondary | ICD-10-CM

## 2014-10-18 DIAGNOSIS — I69892 Facial weakness following other cerebrovascular disease: Secondary | ICD-10-CM

## 2014-10-18 DIAGNOSIS — I69398 Other sequelae of cerebral infarction: Secondary | ICD-10-CM

## 2014-10-18 DIAGNOSIS — M62838 Other muscle spasm: Secondary | ICD-10-CM | POA: Diagnosis not present

## 2014-10-18 DIAGNOSIS — I63511 Cerebral infarction due to unspecified occlusion or stenosis of right middle cerebral artery: Secondary | ICD-10-CM | POA: Diagnosis present

## 2014-10-18 DIAGNOSIS — K5900 Constipation, unspecified: Secondary | ICD-10-CM | POA: Diagnosis not present

## 2014-10-18 DIAGNOSIS — G8110 Spastic hemiplegia affecting unspecified side: Secondary | ICD-10-CM | POA: Diagnosis present

## 2014-10-18 DIAGNOSIS — Z09 Encounter for follow-up examination after completed treatment for conditions other than malignant neoplasm: Secondary | ICD-10-CM

## 2014-10-18 DIAGNOSIS — F1010 Alcohol abuse, uncomplicated: Secondary | ICD-10-CM | POA: Diagnosis not present

## 2014-10-18 DIAGNOSIS — R209 Unspecified disturbances of skin sensation: Secondary | ICD-10-CM

## 2014-10-18 HISTORY — PX: LOOP RECORDER IMPLANT: SHX5477

## 2014-10-18 LAB — BASIC METABOLIC PANEL
ANION GAP: 15 (ref 5–15)
BUN: 17 mg/dL (ref 6–23)
CALCIUM: 9.8 mg/dL (ref 8.4–10.5)
CO2: 23 mEq/L (ref 19–32)
Chloride: 101 mEq/L (ref 96–112)
Creatinine, Ser: 0.95 mg/dL (ref 0.50–1.35)
GFR calc Af Amer: >90 mL/min (ref >90)
GFR calc non Af Amer: >90 mL/min (ref >90)
Glucose, Bld: 102 mg/dL — ABNORMAL HIGH (ref 70–99)
Potassium: 4.1 mEq/L (ref 3.7–5.3)
SODIUM: 139 meq/L (ref 137–147)

## 2014-10-18 LAB — PROTHROMBIN GENE MUTATION

## 2014-10-18 SURGERY — LOOP RECORDER IMPLANT
Anesthesia: LOCAL

## 2014-10-18 MED ORDER — ALUM & MAG HYDROXIDE-SIMETH 200-200-20 MG/5ML PO SUSP: 30.0000 mL | ORAL | Status: DC | PRN

## 2014-10-18 MED ORDER — SENNOSIDES-DOCUSATE SODIUM 8.6-50 MG PO TABS
1.0000 | ORAL_TABLET | Freq: Every evening | ORAL | Status: DC | PRN
  Administered 2014-10-26: 1 via ORAL
  Filled 2014-10-18: qty 1

## 2014-10-18 MED ORDER — ENOXAPARIN SODIUM 40 MG/0.4ML ~~LOC~~ SOLN
40.0000 mg | SUBCUTANEOUS | Status: DC
  Administered 2014-10-19 – 2014-10-21 (×3): 40 mg via SUBCUTANEOUS
  Filled 2014-10-18 (×6): qty 0.4

## 2014-10-18 MED ORDER — GUAIFENESIN-DM 100-10 MG/5ML PO SYRP
5.0000 mL | ORAL_SOLUTION | Freq: Four times a day (QID) | ORAL | Status: DC | PRN
Start: 2014-10-18 — End: 2014-11-28
  Filled 2014-10-18: qty 10

## 2014-10-18 MED ORDER — ADULT MULTIVITAMIN W/MINERALS CH
1.0000 | ORAL_TABLET | Freq: Every day | ORAL | Status: DC
  Administered 2014-10-19 – 2014-11-05 (×18): 1 via ORAL
  Filled 2014-10-18 (×20): qty 1

## 2014-10-18 MED ORDER — FLEET ENEMA 7-19 GM/118ML RE ENEM: 1.0000 | ENEMA | Freq: Once | RECTAL | Status: AC | PRN

## 2014-10-18 MED ORDER — CHLORHEXIDINE GLUCONATE 0.12 % MT SOLN
15.0000 mL | Freq: Two times a day (BID) | OROMUCOSAL | Status: DC
  Administered 2014-10-18 – 2014-11-28 (×76): 15 mL via OROMUCOSAL
  Filled 2014-10-18 (×85): qty 15

## 2014-10-18 MED ORDER — VITAMIN B-1 100 MG PO TABS
100.0000 mg | ORAL_TABLET | Freq: Every day | ORAL | Status: DC
  Administered 2014-10-19 – 2014-11-05 (×18): 100 mg via ORAL
  Filled 2014-10-18 (×20): qty 1

## 2014-10-18 MED ORDER — ASPIRIN 325 MG PO TABS
325.0000 mg | ORAL_TABLET | Freq: Every day | ORAL | Status: DC
  Administered 2014-10-19 – 2014-10-22 (×4): 325 mg via ORAL
  Filled 2014-10-18 (×5): qty 1

## 2014-10-18 MED ORDER — LIDOCAINE-EPINEPHRINE 1 %-1:100000 IJ SOLN
INTRAMUSCULAR | Status: AC
  Filled 2014-10-18: qty 1

## 2014-10-18 MED ORDER — FOLIC ACID 1 MG PO TABS
1.0000 mg | ORAL_TABLET | Freq: Every day | ORAL | Status: DC
  Administered 2014-10-19 – 2014-11-05 (×18): 1 mg via ORAL
  Filled 2014-10-18 (×20): qty 1

## 2014-10-18 MED ORDER — THIAMINE HCL 100 MG PO TABS: 100.0000 mg | ORAL_TABLET | Freq: Every day | ORAL | Status: AC

## 2014-10-18 MED ORDER — ASPIRIN 325 MG PO TABS: 325.0000 mg | ORAL_TABLET | Freq: Every day | ORAL | Status: DC

## 2014-10-18 MED ORDER — PROCHLORPERAZINE 25 MG RE SUPP
12.5000 mg | Freq: Four times a day (QID) | RECTAL | Status: DC | PRN
  Filled 2014-10-18: qty 1

## 2014-10-18 MED ORDER — ATORVASTATIN CALCIUM 20 MG PO TABS
20.0000 mg | ORAL_TABLET | Freq: Every day | ORAL | Status: DC
  Administered 2014-10-19 – 2014-11-27 (×40): 20 mg via ORAL
  Filled 2014-10-18 (×42): qty 1

## 2014-10-18 MED ORDER — ACETAMINOPHEN 325 MG PO TABS
325.0000 mg | ORAL_TABLET | ORAL | Status: DC | PRN
  Administered 2014-10-18: 325 mg via ORAL
  Administered 2014-10-19 – 2014-10-20 (×3): 650 mg via ORAL
  Filled 2014-10-18 (×4): qty 2

## 2014-10-18 MED ORDER — ACETAMINOPHEN 325 MG PO TABS
650.0000 mg | ORAL_TABLET | Freq: Four times a day (QID) | ORAL | Status: DC | PRN
  Administered 2014-10-19 – 2014-11-17 (×14): 650 mg via ORAL
  Filled 2014-10-18 (×14): qty 2

## 2014-10-18 MED ORDER — PROCHLORPERAZINE MALEATE 5 MG PO TABS
5.0000 mg | ORAL_TABLET | Freq: Four times a day (QID) | ORAL | Status: DC | PRN
  Filled 2014-10-18: qty 2

## 2014-10-18 MED ORDER — ATORVASTATIN CALCIUM 20 MG PO TABS: 20.0000 mg | ORAL_TABLET | Freq: Every day | ORAL | Status: DC

## 2014-10-18 MED ORDER — FOLIC ACID 1 MG PO TABS: 1.0000 mg | ORAL_TABLET | Freq: Every day | ORAL | Status: DC

## 2014-10-18 MED ORDER — SENNOSIDES-DOCUSATE SODIUM 8.6-50 MG PO TABS
1.0000 | ORAL_TABLET | Freq: Every evening | ORAL | Status: DC | PRN
  Administered 2014-10-24: 1 via ORAL
  Filled 2014-10-18 (×2): qty 1

## 2014-10-18 MED ORDER — TRAZODONE HCL 50 MG PO TABS
25.0000 mg | ORAL_TABLET | Freq: Every evening | ORAL | Status: DC | PRN
  Administered 2014-10-21: 25 mg via ORAL
  Administered 2014-10-22 – 2014-11-26 (×28): 50 mg via ORAL
  Filled 2014-10-18 (×31): qty 1

## 2014-10-18 MED ORDER — CETYLPYRIDINIUM CHLORIDE 0.05 % MT LIQD
7.0000 mL | Freq: Two times a day (BID) | OROMUCOSAL | Status: DC
  Administered 2014-10-18 – 2014-11-28 (×62): 7 mL via OROMUCOSAL

## 2014-10-18 MED ORDER — BISACODYL 10 MG RE SUPP: 10.0000 mg | Freq: Every day | RECTAL | Status: DC | PRN

## 2014-10-18 MED ORDER — PROCHLORPERAZINE EDISYLATE 5 MG/ML IJ SOLN
5.0000 mg | Freq: Four times a day (QID) | INTRAMUSCULAR | Status: DC | PRN
  Administered 2014-10-23: 5 mg via INTRAMUSCULAR
  Filled 2014-10-18 (×3): qty 2

## 2014-10-18 MED ORDER — ASPIRIN 300 MG RE SUPP
300.0000 mg | Freq: Every day | RECTAL | Status: DC
  Filled 2014-10-18 (×5): qty 1

## 2014-10-18 MED ORDER — LORAZEPAM 2 MG/ML IJ SOLN
0.0000 mg | Freq: Two times a day (BID) | INTRAMUSCULAR | Status: AC
  Filled 2014-10-18: qty 1

## 2014-10-18 NOTE — Progress Notes (Signed)
Erick Colace, MD Physician Signed Physical Medicine and Rehabilitation Consult Note 10/18/2014 8:10 AM  Related encounter: ED to Hosp-Admission (Current) from 10/15/2014 in [facility]    Expand All Collapse All        Physical Medicine and Rehabilitation Consult Reason for Consult: Left sided weakness, slurred speech, left facial weakness Referring Physician: Dr. Jerral Ralph   HPI: Cory [name] is a 53 y.o. RH- male with history of HTN, Hyperlipidemia, heavy alcohol use, noncompliance with meds; who was admitted on 10/15/14 with left facial droop, slurred speech, left sided weakness and somnolence. CT head with ischemic changes in R-MCA territory affecting frontal cortex, anterior coronal radiata and dorsal striatum. 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 Carotid dopplers limited due to poor cooperation--R-ICA with 1-30% ICA stenosis. TEE done revealing EF 55-65% with no PFO and increased RA pressure. Hypercoagulopathy panel pending. MBS done revealing moderate oral and mild pharyngeal dysphagia and patient placed on dysphagia 2, nectar liquids. Neurology recommending ASA for secondary stroke prevention. Therapy evaluations done 11/25 and patient limited by dense L- hemiplegia, poor truncal control with impaired poor safety awareness. CIR recommended by rehab team and MD for follow up therapy.   Pt without c/o States he has help at home "me" Review of Systems  Unable to perform ROS: mental acuity      Past Medical History  Diagnosis Date  . Medical history non-contributory    Past Surgical History  Procedure Laterality Date  . No past surgeries     Family History  Problem Relation Age of Onset  . Family history unknown: Yes   Social History:  reports that he has quit smoking. He does not have any  smokeless tobacco history on file. He reports that he drinks alcohol. He reports that he does not use illicit drugs. Allergies: No Known Allergies Medications Prior to Admission  Medication Sig Dispense Refill  . HYDROcodone-acetaminophen (NORCO/VICODIN) 5-325 MG per tablet Take 1-2 tablets by mouth every 4 (four) hours as needed. 12 tablet 0  . ibuprofen (ADVIL,MOTRIN) 200 MG tablet Take 200 mg by mouth every 6 (six) hours as needed for mild pain.      Home: Home Living Family/patient expects to be discharged to:: Private residence Living Arrangements: Spouse/significant other Type of Home: House Home Access: Stairs to enter Secretary/administrator of Steps: 5 Entrance Stairs-Rails: None Home Layout: One level Home Equipment: Crutches Additional Comments: Still driving  Functional History: Prior Function Level of Independence: Independent Functional Status:  Mobility: Bed Mobility Overal bed mobility: Needs Assistance Bed Mobility: Supine to Sit Supine to sit: Min assist General bed mobility comments: Heavy use of rails and VC's for technique. Multiple attempts to get to EOB. Increased effort. Transfers Overall transfer level: Needs assistance Equipment used: 2 person hand held assist Transfers: Sit to/from Stand, Stand Pivot Transfers Sit to Stand: Mod assist, +2 physical assistance Stand pivot transfers: Mod assist, +2 physical assistance General transfer comment: Mod +2 (A) for sit<>stand with blocking of L knee. Pt required assist for boost to stand and to maintain balance. Verbal cues to pick head up and stand up tall. Mod +2 (A) for stand-pivot to recliner with blocking of L knee and manual (A) to move L foot back. Pt attempted to hop on RLE with knee buckling noted.  Ambulation/Gait General Gait Details: Deferred.    ADL: ADL Overall ADL's :  Needs assistance/impaired Grooming: Wash/dry face, Supervision/safety, Sitting Grooming Details (indicate cue  type and reason): Pt able to wipe drool from mouth. Lower Body Dressing: Total assistance, Sitting/lateral leans Lower Body Dressing Details (indicate cue type and reason): Total (A) to don socks. Pt unable to bring R foot across knee without severe L lateral lean and complete LOB while seated EOB. Functional mobility during ADLs: Moderate assistance, +2 for physical assistance General ADL Comments: Pt with significant L lateral lean during static sitting. Pt already demonstrating compensatory strategies by using RUE to move LUE. Mod +2 (A) for functional mobility due to pt uanble to initiate movement with LLE during transfers in today's sessions  Cognition: Cognition Overall Cognitive Status: Impaired/Different from baseline Orientation Level: Oriented X4 Cognition Arousal/Alertness: Awake/alert Behavior During Therapy: Flat affect Overall Cognitive Status: Impaired/Different from baseline Area of Impairment: Attention, Following commands, Safety/judgement, Awareness, Problem solving Current Attention Level: Sustained Memory: Decreased short-term memory Following Commands: Follows one step commands inconsistently, Follows one step commands with increased time Safety/Judgement: Decreased awareness of safety, Decreased awareness of deficits Awareness: Emergent Problem Solving: Difficulty sequencing, Requires verbal cues General Comments: Pt reporting, "if I had my car, I would walk down to the parking lot and drive to get myself someone to eat," after requiring 2 person assist to get to chair.  Blood pressure 142/99, pulse 83, temperature 98.8 F (37.1 C), temperature source Oral, resp. rate 18, height 5\' 8"  (1.727 m), weight 79.289 kg (174 lb 12.8 oz), SpO2 100 %. Physical Exam  Nursing note and vitals reviewed. Constitutional: He is oriented to person, place, and time. He appears well-developed and well-nourished.  HENT:  Head: Normocephalic.  Eyes: Conjunctivae are normal. Pupils are  equal, round, and reactive to light.  Right gaze preference  Neck:  Patient does not turn neck to the right  Cardiovascular: Normal rate, regular rhythm and normal heart sounds.  Respiratory: Effort normal and breath sounds normal.  GI: Soft. Bowel sounds are normal. He exhibits no distension. There is no tenderness. There is no guarding.  Genitourinary: Penis normal.  Musculoskeletal:  No pain with active range of motion or passive range of motion to the upper or lower extremities  Neurological: He is alert and oriented to person, place, and time.  Skin: Skin is warm and dry.  Psychiatric: His affect is blunt. His speech is delayed and slurred. He is slowed and withdrawn. He is inattentive.  Motor strength 0/5 in left deltoid, biceps, triceps, grip, hip flexor, trace knee extensor, 0 ankle dorsiflexion plantar flexion 5/5 on the right side in the same muscle groups Absent sensation in the left upper and left lower extremity Left lower facial droop Visual fields absent to confrontation testing on left side   Lab Results Last 24 Hours    Results for orders placed or performed during the hospital encounter of 10/15/14 (from the past 24 hour(s))  Basic metabolic panel Status: Abnormal   Collection Time: 10/18/14 4:10 AM  Result Value Ref Range   Sodium 139 137 - 147 mEq/L   Potassium 4.1 3.7 - 5.3 mEq/L   Chloride 101 96 - 112 mEq/L   CO2 23 19 - 32 mEq/L   Glucose, Bld 102 (H) 70 - 99 mg/dL   BUN 17 6 - 23 mg/dL   Creatinine, Ser 0.980.95 0.50 - 1.35 mg/dL   Calcium 9.8 8.4 - 11.910.5 mg/dL   GFR calc non Af Amer >90 >90 mL/min   GFR calc Af Amer >90 >90 mL/min  Anion gap 15 5 - 15      Imaging Results (Last 48 hours)    Ct Angio Head W/cm &/or Wo Cm  10/16/2014 ADDENDUM REPORT: 10/16/2014 18:10 ADDENDUM: Study discussed by telephone with stroke provider SHARON BIBY on 10/16/2014 at 1802 hrs. Electronically Signed By:  Augusto GambleLee Hall M.D. On: 10/16/2014 18:10   10/16/2014 CLINICAL DATA: 53 year old male with left side weakness status post right MCA infarct diagnosed on recent MRI. Initial encounter. EXAM: CT ANGIOGRAPHY HEAD AND NECK TECHNIQUE: Multidetector CT imaging of the head and neck was performed using the standard protocol during bolus administration of intravenous contrast. Multiplanar CT image reconstructions and MIPs were obtained to evaluate the vascular anatomy. Carotid stenosis measurements (when applicable) are obtained utilizing NASCET criteria, using the distal internal carotid diameter as the denominator. CONTRAST: 80mL OMNIPAQUE IOHEXOL 350 MG/ML SOLN COMPARISON: Limited brain MRI 10/15/2014. Head CT without contrast 10/15/2014. FINDINGS: CTA HEAD FINDINGS Progression of cytotoxic edema in the right MCA territory, patchy in discontinuous in some places. Right caudate nucleus involvement. No associated petechial hemorrhage. Associated mass effect with partial effacement of the right lateral ventricle. Mild leftward midline shift at 2 mm is new. Left hemisphere and posterior fossa gray-white matter differentiation is stable. Patchy mostly gyriform enhancement in the right MCA territory following contrast administration. Stable visualized osseous structures. Visualized paranasal sinuses and mastoids are clear. Visualized orbits and scalp soft tissues are within normal limits. VASCULAR FINDINGS: Major intracranial venous structures are enhancing. Mildly dominant distal left vertebral artery. Normal PICA origins. Patent vertebrobasilar junction. No basilar artery stenosis. SCA origins are normal. Fetal type PCA origins, more so the right. Bilateral PCA branches are within normal limits. Normal left ICA siphon except for dolichoectasia. Left ophthalmic and posterior communicating artery origins are within normal limits. Patent right ICA siphon with dolichoectasia but no stenosis. Ophthalmic and right  posterior communicating artery origins are within normal limits. Tortuous posterior communicating arteries, more so the right. Patent carotid termini. Occluded right MCA at its origin. Hyperdense right MCA now evident on noncontrast images. Minimal reconstituted flow in right MCA branches. ACA and left MCA origins are normal. Dominant left A1 segment. Anterior communicating artery within normal limits. Tortuous proximal ACAs. Visualized bilateral ACA branches otherwise within normal limits. Left MCA branches within normal limits. Review of the MIP images confirms the above findings. CTA NECK FINDINGS Negative lung apices. No superior mediastinal lymphadenopathy. Thyroid, larynx, pharynx, parapharyngeal spaces, retropharyngeal space, sublingual space, submandibular glands, and parotid glands are within normal limits. No cervical lymphadenopathy. No acute osseous abnormality identified. VASCULAR FINDINGS: Bovine arch configuration. No arch atherosclerosis or great vessel origin stenosis. No right CCA origin stenosis. Normal right carotid bifurcation. Tortuous but otherwise negative cervical right ICA. No proximal right subclavian artery stenosis. Normal right vertebral artery origin. Mildly non dominant right vertebral artery is normal to the skullbase. No left CCA origin stenosis. Tortuous proximal left CCA. Normal left carotid bifurcation. Tortuous but otherwise negative cervical left ICA. No proximal left subclavian artery stenosis. Normal left vertebral artery origin. Intermittently tortuous but otherwise normal left vertebral artery to the skullbase. Review of the MIP images confirms the above findings. IMPRESSION: 1. 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 2. Intracranial CTA otherwise negative except for dolichoectasia. 3. Negative neck CTA except for tortuous ICAs. No right carotid plaque or stenosis identified. 4. Expected evolution of overall moderate size  right MCA infarct. Mass effect with mild leftward midline shift (2 mm). No hemorrhagic transformation. Electronically  Signed: By: Augusto Gamble M.D. On: 10/16/2014 17:57   Ct Angio Neck W/cm &/or Wo/cm  10/16/2014 ADDENDUM REPORT: 10/16/2014 18:10 ADDENDUM: Study discussed by telephone with stroke provider SHARON BIBY on 10/16/2014 at 1802 hrs. Electronically Signed By: Augusto Gamble M.D. On: 10/16/2014 18:10   10/16/2014 CLINICAL DATA: 53 year old male with left side weakness status post right MCA infarct diagnosed on recent MRI. Initial encounter. EXAM: CT ANGIOGRAPHY HEAD AND NECK TECHNIQUE: Multidetector CT imaging of the head and neck was performed using the standard protocol during bolus administration of intravenous contrast. Multiplanar CT image reconstructions and MIPs were obtained to evaluate the vascular anatomy. Carotid stenosis measurements (when applicable) are obtained utilizing NASCET criteria, using the distal internal carotid diameter as the denominator. CONTRAST: 80mL OMNIPAQUE IOHEXOL 350 MG/ML SOLN COMPARISON: Limited brain MRI 10/15/2014. Head CT without contrast 10/15/2014. FINDINGS: CTA HEAD FINDINGS Progression of cytotoxic edema in the right MCA territory, patchy in discontinuous in some places. Right caudate nucleus involvement. No associated petechial hemorrhage. Associated mass effect with partial effacement of the right lateral ventricle. Mild leftward midline shift at 2 mm is new. Left hemisphere and posterior fossa gray-white matter differentiation is stable. Patchy mostly gyriform enhancement in the right MCA territory following contrast administration. Stable visualized osseous structures. Visualized paranasal sinuses and mastoids are clear. Visualized orbits and scalp soft tissues are within normal limits. VASCULAR FINDINGS: Major intracranial venous structures are enhancing. Mildly dominant distal left vertebral artery. Normal PICA origins. Patent  vertebrobasilar junction. No basilar artery stenosis. SCA origins are normal. Fetal type PCA origins, more so the right. Bilateral PCA branches are within normal limits. Normal left ICA siphon except for dolichoectasia. Left ophthalmic and posterior communicating artery origins are within normal limits. Patent right ICA siphon with dolichoectasia but no stenosis. Ophthalmic and right posterior communicating artery origins are within normal limits. Tortuous posterior communicating arteries, more so the right. Patent carotid termini. Occluded right MCA at its origin. Hyperdense right MCA now evident on noncontrast images. Minimal reconstituted flow in right MCA branches. ACA and left MCA origins are normal. Dominant left A1 segment. Anterior communicating artery within normal limits. Tortuous proximal ACAs. Visualized bilateral ACA branches otherwise within normal limits. Left MCA branches within normal limits. Review of the MIP images confirms the above findings. CTA NECK FINDINGS Negative lung apices. No superior mediastinal lymphadenopathy. Thyroid, larynx, pharynx, parapharyngeal spaces, retropharyngeal space, sublingual space, submandibular glands, and parotid glands are within normal limits. No cervical lymphadenopathy. No acute osseous abnormality identified. VASCULAR FINDINGS: Bovine arch configuration. No arch atherosclerosis or great vessel origin stenosis. No right CCA origin stenosis. Normal right carotid bifurcation. Tortuous but otherwise negative cervical right ICA. No proximal right subclavian artery stenosis. Normal right vertebral artery origin. Mildly non dominant right vertebral artery is normal to the skullbase. No left CCA origin stenosis. Tortuous proximal left CCA. Normal left carotid bifurcation. Tortuous but otherwise negative cervical left ICA. No proximal left subclavian artery stenosis. Normal left vertebral artery origin. Intermittently tortuous but otherwise normal left  vertebral artery to the skullbase. Review of the MIP images confirms the above findings. IMPRESSION: 1. 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 2. Intracranial CTA otherwise negative except for dolichoectasia. 3. Negative neck CTA except for tortuous ICAs. No right carotid plaque or stenosis identified. 4. Expected evolution of overall moderate size right MCA infarct. Mass effect with mild leftward midline shift (2 mm). No hemorrhagic transformation. Electronically Signed: By: Augusto Gamble M.D. On: 10/16/2014 17:57   Dg  Swallowing Func-speech Pathology  10/16/2014 Carolan Shiver, CCC-SLP 10/16/2014 2:55 PM Objective Swallowing Evaluation: Modified Barium Swallowing Study Patient Details Name: Wynter Grave MRN: [id number] Date of Birth: [birth year] Today's Date: 10/16/2014 Time: 1250-1314 SLP Time Calculation (min) (ACUTE ONLY): 24 min Past Medical History: Past Medical History Diagnosis Date . Medical history non-contributory Past Surgical History: Past Surgical History Procedure Laterality Date . No past surgeries HPI: 53 y.o. male with history of alcoholism was brought to the ER with left facial and left-sided hemiplegia. CT head showed features consistent for CVA on the right MCA territory. CXR Suboptimal inspiration. No acute cardiopulmonary disease.Marland Kitchen Poor performance on bedside swallow eval with recs for MBS. Assessment / Plan / Recommendation Clinical Impression Dysphagia Diagnosis: Moderate oral phase dysphagia;Mild pharyngeal phase dysphagia Clinical impression: Pt presents with a moderate oral, mild pharyngeal dysphagia marked by significant deficits in sensation with copious amounts of all materials spilling from left anterior oral caviity without awareness. There is also premature loss of all POs over base of tongue, with thin liquids immediately being aspirated without awareness. Pharyngeal clearance of  materials was adequate with no residue post-swallow. Pt extremely impulsive, with difficulty waiting for instructions, following commands, and lacking insight. Recommend initiating a dysphagia 2 diet with nectar-thick liquids initially. Pt will require full supervision given cognitive deficits; will require cues during meal to maintain safety. Treatment Recommendation Therapy as outlined in treatment plan below Diet Recommendation Dysphagia 2 (Fine chop);Nectar-thick liquid Liquid Administration via: Cup Medication Administration: Whole meds with puree Supervision: Patient able to self feed;Full supervision/cueing for compensatory strategies Compensations: Small sips/bites;Slow rate;Check for pocketing;Check for anterior loss Postural Changes and/or Swallow Maneuvers: Seated upright 90 degrees Other Recommendations Oral Care Recommendations: Oral care BID Other Recommendations: Order thickener from pharmacy Follow Up Recommendations (tba) Frequency and Duration min 3x week 2 weeks SLP Swallow Goals General Date of Onset: 10/15/14 HPI: 53 y.o. male with history of alcoholism was brought to the ER with left facial and left-sided hemiplegia. CT head showed features consistent for CVA on the right MCA territory. CXR Suboptimal inspiration. No acute cardiopulmonary disease.Marland Kitchen Poor performance on bedside swallow eval with recs for MBS. Type of Study: Modified Barium Swallowing Study Reason for Referral: Objectively evaluate swallowing function Diet Prior to this Study: NPO Temperature Spikes Noted: No Respiratory Status: Room air History of Recent Intubation: No Behavior/Cognition: Alert;Cooperative;Pleasant mood Oral Cavity - Dentition: Poor condition;Missing dentition Oral Motor / Sensory Function: Impaired - see Bedside swallow eval Self-Feeding Abilities: Able to feed self Patient Positioning: Upright in chair Baseline Vocal Quality: Low vocal intensity;Clear  Volitional Cough: Strong Volitional Swallow: Able to elicit Anatomy: Within functional limits Pharyngeal Secretions: Not observed secondary MBS Reason for Referral Objectively evaluate swallowing function Oral Phase Oral Preparation/Oral Phase Oral Phase: Impaired Oral - Solids Oral - Puree: Left anterior bolus loss;Weak lingual manipulation;Incomplete tongue to palate contact Oral - Mechanical Soft: Weak lingual manipulation Oral Phase - Comment Oral Phase - Comment: thin and nectar bolus loss from left of mouth without awareness Pharyngeal Phase Pharyngeal Phase Pharyngeal Phase: Impaired Pharyngeal - Nectar Pharyngeal - Nectar Cup: Premature spillage to pyriform sinuses Pharyngeal - Thin Pharyngeal - Thin Cup: Premature spillage to pyriform sinuses;Penetration/Aspiration during swallow;Reduced airway/laryngeal closure;Trace aspiration Penetration/Aspiration details (thin cup): Material enters airway, passes BELOW cords without attempt by patient to eject out (silent aspiration) Pharyngeal - Solids Pharyngeal - Puree: Premature spillage to valleculae Pharyngeal - Regular: Premature spillage to valleculae Cervical Esophageal Phase Amanda L. 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 Does the need for close, 24 hr/day medical supervision in concert with the patient's rehab needs make it unreasonable for this patient to be served in a less intensive setting? Yes 2. Co-Morbidities requiring supervision/potential complications: History of alcoholism 3. Due to bladder management, bowel management, safety, skin/wound care, disease management, medication administration and patient education, does the patient require 24 hr/day rehab nursing? Yes 4. Does the patient require coordinated care of a  physician, rehab nurse, PT (1-2 hrs/day, 5 days/week), OT (1-2 hrs/day, 5 days/week) and SLP (0.5-1 hrs/day, 5 days/week) to address physical and functional deficits in the context of the above medical diagnosis(es)? Yes Addressing deficits in the following areas: balance, endurance, locomotion, strength, transferring, bowel/bladder control, bathing, dressing, feeding, grooming, toileting, cognition and swallowing 5. Can the patient actively participate in an intensive therapy program of at least 3 hrs of therapy per day at least 5 days per week? Yes 6. The potential for patient to make measurable gains while on inpatient rehab is good 7. Anticipated functional outcomes upon discharge from inpatient rehab are min assist with PT, min assist with OT, min assist with SLP. 8. Estimated rehab length of stay to reach the above functional goals is: 18-22 days 9. Does the patient have adequate social supports and living environment to accommodate these discharge functional goals? Potentially 10. Anticipated D/C setting: home versus SNF depending on caregiver availability 11. Anticipated post D/C treatments: HH therapy 12. Overall Rehab/Functional Prognosis: good  RECOMMENDATIONS: This patient's condition is appropriate for continued rehabilitative care in the following setting: CIR Patient has agreed to participate in recommended program. Potentially Note that insurance prior authorization may be required for reimbursement for recommended care.  Comment: May be a reduced burden of care goal if there is no 24 7 caregiver    10/18/2014       Revision History     Date/Time User Provider Type Action   10/18/2014 9:30 AM Erick Colace, MD Physician Sign   10/18/2014 8:25 AM Jacquelynn Cree, PA-C Physician Assistant Pend   View Details Report       Routing History     Date/Time From To Method   10/18/2014 9:30 AM Erick Colace, MD Erick Colace, MD In Basket    10/18/2014 9:30 AM Erick Colace, MD No Pcp Per Patient In Basket

## 2014-10-18 NOTE — Evaluation (Signed)
Speech Language Pathology Evaluation Patient Details Name: Cory [name] MRN: [id number] DOB: Dec 16, 1960 Today's Date: 10/18/2014 Time: [id number] SLP Time Calculation (min) (ACUTE ONLY): 22 min  Problem List:  Patient Active Problem List   Diagnosis Date Noted  . Dyslipidemia   . Acute CVA (cerebrovascular accident) 10/15/2014  . Stroke 10/15/2014  . Alcoholism 10/15/2014   Past Medical History:  Past Medical History  Diagnosis Date  . Medical history non-contributory    Past Surgical History:  Past Surgical History  Procedure Laterality Date  . No past surgeries     HPI:  53 y.o. male with history of alcoholism was brought to the ER with left facial and left-sided hemiplegia. CT head showed features consistent for CVA on the right MCA territory. CXR Suboptimal inspiration. No acute cardiopulmonary disease.Marland Kitchen.  Poor performance on bedside swallow eval with recs for MBS. MBS completed and diet recommendation was for dys2/nectar due to incr asp risk with thin.  SLE ordered.    Assessment / Plan / Recommendation Clinical Impression  Pt presents with cognitive linguistic deficits due to his CVA resulting in decreased awareness, problem solving, left inattention and poor awareness.   His language is intact and speech is only mildly dysarthric.   After demonstration of left arm weakness, pt verbalized deficit   Pt was oriented x4 and recalled Panther's game yesterday (important to pt).  He will benefit from skilled SlP to maximize cognitive linguistic function to decrease caregiver burden.  Pt agreeable to SLP treatment and expressed gratitude.  Using teach back SLP instructed pt to attend to left side - but he will require assistance due to poor awareness.      SLP Assessment  Patient needs continued Speech Lanaguage Pathology Services    Follow Up Recommendations  Inpatient Rehab    Frequency and Duration min 3x week  2 weeks   Pertinent Vitals/Pain Pain Assessment: No/denies  pain   SLP Goals  Progression toward goals: Progressing toward goals Patient/Family Stated Goal: to get off bedpan Potential Considerations (ACUTE ONLY): Ability to learn/carryover information;Severity of impairments  SLP Evaluation Prior Functioning  Type of Home: House Education: HS per pt Vocation:  (pt reports not working nor retired)   IT consultantCognition  Overall Cognitive Status: Impaired/Different from baseline Arousal/Alertness: Awake/alert Orientation Level: Oriented X4 Attention: Sustained Sustained Attention: Impaired Sustained Attention Impairment: Functional basic (pt with decreased attention to remove phone from ear when finished talking) Memory: Impaired Memory Impairment: Storage deficit;Retrieval deficit;Decreased recall of new information;Decreased short term memory;Prospective memory Decreased Short Term Memory: Verbal complex Awareness: Impaired Awareness Impairment: Intellectual impairment (to left sided weakness and facial deficits) Problem Solving: Impaired (did not call for assist to use call bell for assistance) Safety/Judgment: Impaired    Comprehension  Auditory Comprehension Overall Auditory Comprehension: Impaired Yes/No Questions: Not tested Commands: Impaired One Step Basic Commands: 75-100% accurate Two Step Basic Commands: 75-100% accurate Conversation: Complex Interfering Components: Attention;Processing speed;Working Radio broadcast assistantmemory EffectiveTechniques: Producer, television/film/videoxtra processing time;Repetition Reading Comprehension Reading Status: Impaired Word level: Impaired (? pt with left inattention) Sentence Level: Not tested Paragraph Level: Not tested Functional Environmental (signs, name badge): Not tested Interfering Components: Attention;Visual scanning;Left neglect/inattention (pt reports he may have glaucoma)    Expression Expression Primary Mode of Expression: Verbal Verbal Expression Overall Verbal Expression: Impaired Initiation: No impairment Level of  Generative/Spontaneous Verbalization: Sentence Repetition: No impairment Naming: Not tested Pragmatics: Impairment Impairments: Abnormal affect;Eye contact;Dysprosody;Monotone Interfering Components: Attention Written Expression Dominant Hand: Right Written Expression: Not tested   Oral / Motor Oral Motor/Sensory  Function Overall Oral Motor/Sensory Function: Impaired Labial ROM: Reduced left Labial Symmetry: Abnormal symmetry left Labial Strength: Reduced Lingual ROM:  (to be assessed) Lingual Symmetry:  (to be assessed) Lingual Strength: Reduced Facial ROM: Reduced left Mandible: Within Functional Limits Motor Speech Respiration: Within functional limits Resonance: Within functional limits Articulation: Impaired Level of Impairment: Phrase Intelligibility: Intelligible Motor Planning: Witnin functional limits Motor Speech Errors: Not applicable Effective Techniques: Increased vocal intensity;Slow rate   GO     Mickie BailKimball, Diera Wirkkala Ann Kaelie Henigan Har[place] Ferry[place]ball, [place]MS [facility] [place]LP [phone]

## 2014-10-18 NOTE — H&P (Signed)
Physical Medicine and Rehabilitation Admission H&P    CC: Left sided weakness, slurred speech, left facial weakness  HPI:   Cory [name] is a 53 y.o. RH- male with history of HTN, Hyperlipidemia, heavy alcohol use, noncompliance with meds; who was admitted on 10/15/14 with left facial droop, slurred speech, left sided weakness and somnolence. CT head with ischemic changes in R-MCA territory affecting frontal cortex, anterior coronal radiata and dorsal striatum. 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 Carotid dopplers limited due to poor cooperation--R-ICA with 1-30% ICA stenosis. TEE done revealing EF 55-65% with no PFO and increased RA pressure. Hypercoagulopathy panel pending. MBS done revealing moderate oral and mild pharyngeal dysphagia and patient placed on dysphagia 2, nectar liquids. Neurology recommending ASA for secondary stroke prevention. Therapy evaluations done 11/25 and patient limited by dense L- hemiplegia, poor truncal control with impaired poor safety awareness. Plans for loop recorder today? CIR recommended by rehab team and MD for follow up therapy.    Review of Systems  HENT: Negative for hearing loss.   Eyes: Negative for blurred vision and double vision.  Respiratory: Negative for cough and shortness of breath.   Cardiovascular: Negative for chest pain and palpitations.  Gastrointestinal: Negative for heartburn, nausea and abdominal pain.  Musculoskeletal: Negative for myalgias.  Neurological: Positive for sensory change, speech change and focal weakness. Negative for dizziness and headaches.     Past Medical History  Diagnosis Date  . Medical history non-contributory     Past Surgical History  Procedure Laterality Date  . No past surgeries      Family History  Problem Relation Age of Onset  . Stroke Mother   . Stroke Sister    Social  History:   Lives with girlfriend (question dependability). Moved from [place] 2 years ago and does not have a Field seismologist.   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  He  reports that he has quit smoking--but dips 1 1/2 can snuff daily.   He reports that he drinks alcohol--6 pack beer and a shot of liquor daily.  He reports that he does not use illicit drugs.    Allergies: No Known Allergies    Medications Prior to Admission  Medication Sig Dispense Refill  . HYDROcodone-acetaminophen (NORCO/VICODIN) 5-325 MG per tablet Take 1-2 tablets by mouth every 4 (four) hours as needed. 12 tablet 0  . ibuprofen (ADVIL,MOTRIN) 200 MG tablet Take 200 mg by mouth every 6 (six) hours as needed for mild pain.      Home: Home Living Family/patient expects to be discharged to:: Private residence Living Arrangements: Spouse/significant other Type of Home: House Home Access: Stairs to enter Technical brewer of Steps: 5 Entrance Stairs-Rails: None Home Layout: One level Home Equipment: Crutches Additional Comments: Still driving   Functional History: Prior Function Level of Independence: Independent  Functional Status:  Mobility: Bed Mobility Overal bed mobility: Needs Assistance Bed Mobility: Supine to Sit Supine to sit: Min assist General bed mobility comments: Heavy use of rails and VC's for technique. Multiple attempts to get to EOB. Increased effort. Transfers Overall transfer level: Needs assistance Equipment used: 2 person hand held assist Transfers: Sit to/from Stand, Stand Pivot Transfers Sit to Stand: Mod assist, +2 physical assistance Stand pivot transfers: Mod assist, +2 physical assistance General transfer comment: Mod +2 (A) for sit<>stand with blocking of L  knee. Pt required assist for boost to stand and to maintain balance. Verbal cues to pick head up and stand up tall. Mod +2 (A) for stand-pivot to recliner with  blocking of L knee and manual (A) to move L foot back. Pt attempted to hop on RLE with knee buckling noted.  Ambulation/Gait General Gait Details: Deferred.    ADL: ADL Overall ADL's : Needs assistance/impaired Grooming: Wash/dry face, Supervision/safety, Sitting Grooming Details (indicate cue type and reason): Pt able to wipe drool from mouth. Lower Body Dressing: Total assistance, Sitting/lateral leans Lower Body Dressing Details (indicate cue type and reason): Total (A) to don socks. Pt unable to bring R foot across knee without severe L lateral lean and complete LOB while seated EOB. Functional mobility during ADLs: Moderate assistance, +2 for physical assistance General ADL Comments: Pt with significant L lateral lean during static sitting. Pt already demonstrating compensatory strategies by using RUE to move LUE. Mod +2 (A) for functional mobility due to pt uanble to initiate movement with LLE during transfers in today's sessions  Cognition: Cognition Overall Cognitive Status: Impaired/Different from baseline Arousal/Alertness: Awake/alert Orientation Level: Oriented X4 Attention: Sustained Sustained Attention: Impaired Sustained Attention Impairment: Functional basic (pt with decreased attention to remove phone from ear when finished talking) Memory: Impaired Memory Impairment: Storage deficit, Retrieval deficit, Decreased recall of new information, Decreased short term memory, Prospective memory Decreased Short Term Memory: Verbal complex Awareness: Impaired Awareness Impairment: Intellectual impairment (to left sided weakness and facial deficits) Problem Solving: Impaired (did not call for assist to use call bell for assistance) Safety/Judgment: Impaired Cognition Arousal/Alertness: Awake/alert Behavior During Therapy: Flat affect Overall Cognitive Status: Impaired/Different from baseline Area of Impairment: Attention, Following commands, Safety/judgement, Awareness,  Problem solving Current Attention Level: Sustained Memory: Decreased short-term memory Following Commands: Follows one step commands inconsistently, Follows one step commands with increased time Safety/Judgement: Decreased awareness of safety, Decreased awareness of deficits Awareness: Emergent Problem Solving: Difficulty sequencing, Requires verbal cues General Comments: Pt reporting, "if I had my car, I would walk down to the parking lot and drive to get myself someone to eat," after requiring 2 person assist to get to chair.  Physical Exam: Blood pressure 149/99, pulse 95, temperature 98.1 F (36.7 C), temperature source Oral, resp. rate 20, height _0  (1.727 m), weight 79.289 kg (174 lb 12.8 oz), SpO2 100 %. Physical Exam  Nursing note and vitals reviewed. Constitutional: He is oriented to person, place, and time. He appears well-developed and well-nourished.  HENT:  Head: Normocephalic and atraumatic.  Eyes: Conjunctivae are normal. Pupils are equal, round, and reactive to light.  Left eye lid with crusted dry drainage  Neck: Normal range of motion. Neck supple.  Cardiovascular: Normal rate and regular rhythm.   Respiratory: Effort normal and breath sounds normal. No respiratory distress. He has no wheezes.  GI: Soft. Bowel sounds are normal.  Musculoskeletal: He exhibits no edema or tenderness.  Neurological: He is alert and oriented to person, place, and time.  Left facial weakness with mild dysarthria and left inattention. Poor postural awareness--slumped to the right but able to correct with cues. Has poor insight and awareness of deficits and easily distracted and impulsive. Motor strength 0/5 in left deltoid, biceps, triceps, grip, hip flexor, trace knee extensor, 0 ankle dorsiflexion plantar flexion 5/5 on the right side in the same muscle groups.  Absent sensation in the left upper and left lower extremity  Visual fields absent to confrontation testing on left side  Skin:  Skin is warm and dry.  Psychiatric: His affect is blunt. His speech is slurred. He is withdrawn. He expresses impulsivity.    Results for orders placed or performed during the hospital encounter of 10/15/14 (from the past 48 hour(s))  Basic metabolic panel     Status: Abnormal   Collection Time: 10/16/14 11:53 AM  Result Value Ref Range   Sodium 137 137 - 147 mEq/L   Potassium 4.0 3.7 - 5.3 mEq/L   Chloride 100 96 - 112 mEq/L   CO2 24 19 - 32 mEq/L   Glucose, Bld 89 70 - 99 mg/dL   BUN 16 6 - 23 mg/dL   Creatinine, Ser 1.10 0.50 - 1.35 mg/dL   Calcium 9.2 8.4 - 10.5 mg/dL   GFR calc non Af Amer 74 (L) >90 mL/min   GFR calc Af Amer 86 (L) >90 mL/min    Comment: (NOTE) The eGFR has been calculated using the CKD EPI equation. This calculation has not been validated in all clinical situations. eGFR's persistently <90 mL/min signify possible Chronic Kidney Disease.    Anion gap 13 5 - 15  Protime-INR     Status: None   Collection Time: 10/16/14 11:53 AM  Result Value Ref Range   Prothrombin Time 14.1 11.6 - 15.2 seconds   INR 1.08 0.00 - 0.17  Basic metabolic panel     Status: Abnormal   Collection Time: 10/18/14  4:10 AM  Result Value Ref Range   Sodium 139 137 - 147 mEq/L   Potassium 4.1 3.7 - 5.3 mEq/L   Chloride 101 96 - 112 mEq/L   CO2 23 19 - 32 mEq/L   Glucose, Bld 102 (H) 70 - 99 mg/dL   BUN 17 6 - 23 mg/dL   Creatinine, Ser 0.95 0.50 - 1.35 mg/dL   Calcium 9.8 8.4 - 10.5 mg/dL   GFR calc non Af Amer >90 >90 mL/min   GFR calc Af Amer >90 >90 mL/min    Comment: (NOTE) The eGFR has been calculated using the CKD EPI equation. This calculation has not been validated in all clinical situations. eGFR's persistently <90 mL/min signify possible Chronic Kidney Disease.    Anion gap 15 5 - 15   Ct Angio Head W/cm &/or Wo Cm  10/16/2014   ADDENDUM REPORT: 10/16/2014 18:10  ADDENDUM: Study discussed by telephone with stroke provider SHARON BIBY on 10/16/2014 at 1802 hrs.    Electronically Signed   By: Lars Pinks M.D.   On: 10/16/2014 18:10   10/16/2014   CLINICAL DATA:  53 year old male with left side weakness status post right MCA infarct diagnosed on recent MRI. Initial encounter.  EXAM: CT ANGIOGRAPHY HEAD AND NECK  TECHNIQUE: Multidetector CT imaging of the head and neck was performed using the standard protocol during bolus administration of intravenous contrast. Multiplanar CT image reconstructions and MIPs were obtained to evaluate the vascular anatomy. Carotid stenosis measurements (when applicable) are obtained utilizing NASCET criteria, using the distal internal carotid diameter as the denominator.  CONTRAST:  74m OMNIPAQUE IOHEXOL 350 MG/ML SOLN  COMPARISON:  Limited brain MRI 10/15/2014. Head CT without contrast 10/15/2014.  FINDINGS: CTA HEAD FINDINGS  Progression of cytotoxic edema in the right MCA territory, patchy in discontinuous in some places. Right caudate nucleus involvement. No associated petechial hemorrhage. Associated mass effect with partial effacement of the right lateral ventricle. Mild leftward midline shift at 2 mm is new. Left hemisphere and posterior fossa gray-white matter differentiation is stable. Patchy mostly gyriform  enhancement in the right MCA territory following contrast administration.  Stable visualized osseous structures. Visualized paranasal sinuses and mastoids are clear. Visualized orbits and scalp soft tissues are within normal limits.  VASCULAR FINDINGS:  Major intracranial venous structures are enhancing.  Mildly dominant distal left vertebral artery. Normal PICA origins. Patent vertebrobasilar junction. No basilar artery stenosis. SCA origins are normal. Fetal type PCA origins, more so the right. Bilateral PCA branches are within normal limits.  Normal left ICA siphon except for dolichoectasia. Left ophthalmic and posterior communicating artery origins are within normal limits. Patent right ICA siphon with dolichoectasia but no  stenosis. Ophthalmic and right posterior communicating artery origins are within normal limits. Tortuous posterior communicating arteries, more so the right. Patent carotid termini.  Occluded right MCA at its origin. Hyperdense right MCA now evident on noncontrast images. Minimal reconstituted flow in right MCA branches.  ACA and left MCA origins are normal. Dominant left A1 segment. Anterior communicating artery within normal limits. Tortuous proximal ACAs. Visualized bilateral ACA branches otherwise within normal limits. Left MCA branches within normal limits.  Review of the MIP images confirms the above findings.  CTA NECK FINDINGS  Negative lung apices. No superior mediastinal lymphadenopathy. Thyroid, larynx, pharynx, parapharyngeal spaces, retropharyngeal space, sublingual space, submandibular glands, and parotid glands are within normal limits. No cervical lymphadenopathy. No acute osseous abnormality identified.  VASCULAR FINDINGS: Bovine arch configuration. No arch atherosclerosis or great vessel origin stenosis.  No right CCA origin stenosis. Normal right carotid bifurcation. Tortuous but otherwise negative cervical right ICA.  No proximal right subclavian artery stenosis. Normal right vertebral artery origin. Mildly non dominant right vertebral artery is normal to the skullbase.  No left CCA origin stenosis. Tortuous proximal left CCA. Normal left carotid bifurcation. Tortuous but otherwise negative cervical left ICA.  No proximal left subclavian artery stenosis. Normal left vertebral artery origin. Intermittently tortuous but otherwise normal left vertebral artery to the skullbase.  Review of the MIP images confirms the above findings.  IMPRESSION: 1. 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 2. Intracranial CTA otherwise negative except for dolichoectasia. 3. Negative neck CTA except for tortuous ICAs. No right carotid plaque or stenosis identified. 4. Expected  evolution of overall moderate size right MCA infarct. Mass effect with mild leftward midline shift (2 mm). No hemorrhagic transformation.  Electronically Signed: By: Lars Pinks M.D. On: 10/16/2014 17:57      Medical Problem List and Plan: 1. Functional deficits secondary to R-MCA infarct question embolic v/s thrombotic with resultant left hemiplegia with sensory deficits, left facial weakness, left neglect and dysphagia. 2.  DVT Prophylaxis/Anticoagulation: Pharmaceutical: Lovenox 3. Pain Management: N/A 4. Mood:  Seems withdrawn--will monitor for now.  LCSW to follow for support and evaluation.  5. Neuropsych: This patient is capable of making decisions on his own behalf. 6. Skin/Wound Care: Routine pressure relief measures.  7. Fluids/Electrolytes/Nutrition:  Monitor I/O. Follow up labs in am.  8. HTN: 9. H/o Alcohol abuse/Tobacco use: Will add nicotine patch as asking for his snuff. CIWA protocol. Continue folic acid and thiamine.  10. Dyslipidemia:    Post Admission Physician Evaluation: 1. Functional deficits secondary  to  R-MCA infarct question embolic v/s thrombotic with resultant left hemiplegia with sensory deficits, left facial weakness, left neglect and dysphagia. 1.  2. Patient is admitted to receive collaborative, interdisciplinary care between the physiatrist, rehab nursing staff, and therapy team. 3. Patient's level of medical complexity and substantial therapy needs in context of that  medical necessity cannot be provided at a lesser intensity of care such as a SNF. 4. Patient has experienced substantial functional loss from his/her baseline which was documented above under the "Functional History" and "Functional Status" headings.  Judging by the patient's diagnosis, physical exam, and functional history, the patient has potential for functional progress which will result in measurable gains while on inpatient rehab.  These gains will be of substantial and practical use upon  discharge  in facilitating mobility and self-care at the household level. 5. Physiatrist will provide 24 hour management of medical needs as well as oversight of the therapy plan/treatment and provide guidance as appropriate regarding the interaction of the two. 6. 24 hour rehab nursing will assist with bladder management, bowel management, safety, skin/wound care, disease management, medication administration, pain management and patient education  and help integrate therapy concepts, techniques,education, etc. 7. PT will assess and treat for/with: Lower extremity strength, range of motion, stamina, balance, functional mobility, safety, adaptive techniques and equipment, NMR, visual perceptual awareness, ego support, stroke education, pain mgt.   Goals are: supervision to min assist. 8. OT will assess and treat for/with: ADL's, functional mobility, safety, upper extremity strength, adaptive techniques and equipment, NMR, visual perceptual awareness, stroke education, community reintegration.   Goals are: supervision to min assist. Therapy may proceed with showering this patient. 9. SLP will assess and treat for/with: communication, speech, swallowing, cognition.  Goals are: supervision to min assist. 10. Case Management and Social Worker will [employer] will be held weekly to assess progress toward goals and to determine barriers to discharge. 11. Patient will receive at least 3 hours of therapy per day at least 5 days per week. 12. ELOS: 18-23 days       13. Prognosis:  good     Meredith Staggers, MD, [facility] 10/18/2014   10/18/2014

## 2014-10-18 NOTE — Plan of Care (Signed)
Problem: Acute Treatment Outcomes Goal: Airway maintained/protected Outcome: Completed/Met Date Met:  10/18/14

## 2014-10-18 NOTE — Progress Notes (Signed)
PMR Admission Coordinator Pre-Admission Assessment  Patient: Naida SleightStephen Bilotta is an 53 y.o., male MRN: [id number] DOB: Jun 05, 1961 Height: 5\' 8"  (172.7 cm) Weight: 79.289 kg (174 lb 12.8 oz)  Insurance Information Self pay - no insurance  Medicaid Application Date: Case Manager:  Disability Application Date: Case Worker:   Emergency Conservator, museum/galleryContact Information Contact Information    Name Relation Home Work Mobile   MorrowVaughn,Tyisha Daughter   [phone]   Audree CamelJohnson,Genetha Sister   [phone]   Campbell,Lynette Significant other [phone]       Current Medical History  Patient Admitting Diagnosis: R MCA infarct  History of Present Illness: A 53 y.o. RH- male with history of HTN, Hyperlipidemia, heavy alcohol use, noncompliance with meds; who was admitted on 10/15/14 with left facial droop, slurred speech, left sided weakness and somnolence. CT head with ischemic changes in R-MCA territory affecting frontal cortex, anterior coronal radiata and dorsal striatum. 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 Carotid dopplers limited due to poor cooperation--R-ICA with 1-30% ICA stenosis. TEE done revealing EF 55-65% with no PFO and increased RA pressure. Hypercoagulopathy panel pending. MBS done revealing moderate oral and mild pharyngeal dysphagia and patient placed on dysphagia 2, nectar liquids. Neurology recommending ASA for secondary stroke prevention. Therapy evaluations done 11/25 and patient limited by dense L- hemiplegia, poor truncal control with impaired poor safety awareness. CIR recommended by rehab team and MD for follow up therapy.   Total: 12=NIH  Past Medical History  Past Medical History   Diagnosis Date  . Medical history non-contributory     Family History  family history includes Stroke in his mother and sister.  Prior Rehab/Hospitalizations: None, however had left ankle surgery 2 weeks ago and cast just came off left ankle this past Monday  Current Medications  Current facility-administered medications: acetaminophen (TYLENOL) tablet 650 mg, 650 mg, Oral, Q6H PRN, Maretta BeesShanker M Ghimire, MD, 650 mg at 10/17/14 2230; antiseptic oral rinse (CPC / CETYLPYRIDINIUM CHLORIDE 0.05%) solution 7 mL, 7 mL, Mouth Rinse, q12n4p, Maretta BeesShanker M Ghimire, MD, 7 mL at 10/17/14 1600 aspirin suppository 300 mg, 300 mg, Rectal, Daily, 300 mg at 10/16/14 1059 **OR** aspirin tablet 325 mg, 325 mg, Oral, Daily, Eduard ClosArshad N Kakrakandy, MD, 325 mg at 10/18/14 [id number]; atorvastatin (LIPITOR) tablet 20 mg, 20 mg, Oral, q1800, Maretta BeesShanker M Ghimire, MD, 20 mg at 10/17/14 1808; chlorhexidine (PERIDEX) 0.12 % solution 15 mL, 15 mL, Mouth Rinse, BID, Maretta BeesShanker M Ghimire, MD, 15 mL at 10/18/14 0927 enoxaparin (LOVENOX) injection 40 mg, 40 mg, Subcutaneous, Q24H, Eduard ClosArshad N Kakrakandy, MD, 40 mg at 10/18/14 [id number]; folic acid (FOLVITE) tablet 1 mg, 1 mg, Oral, Daily, Eduard ClosArshad N Kakrakandy, MD, 1 mg at 10/18/14 [id number]; [EXPIRED] LORazepam (ATIVAN) injection 0-4 mg, 0-4 mg, Intravenous, Q6H, 0 mg at 10/15/14 0400 **FOLLOWED BY** LORazepam (ATIVAN) injection 0-4 mg, 0-4 mg, Intravenous, Q12H, Eduard ClosArshad N Kakrakandy, MD, 2 mg at 10/18/14 0500 multivitamin with minerals tablet 1 tablet, 1 tablet, Oral, Daily, Eduard ClosArshad N Kakrakandy, MD, 1 tablet at 10/18/14 [id number]; senna-docusate (Senokot-S) tablet 1 tablet, 1 tablet, Oral, QHS PRN, Eduard ClosArshad N Kakrakandy, MD; thiamine (VITAMIN B-1) tablet 100 mg, 100 mg, Oral, Daily, 100 mg at 10/18/14 [id number] **OR** [DISCONTINUED] thiamine (B-1) injection 100 mg, 100 mg, Intravenous, Daily, Eduard ClosArshad N Kakrakandy, MD, 100 mg at 10/16/14 1059  Patients Current Diet: DIET - DYS 1  Precautions /  Restrictions Precautions Precautions: Fall Precaution Comments: brace on left ankle  from recent fx. Restrictions Weight Bearing Restrictions: No   Prior Activity Level Community (5-7x/wk): Went out daily. 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 Was driving.   Home Assistive Devices / Equipment Home Assistive Devices/Equipment: None Home Equipment: Crutches  Prior Functional Level Prior Function Level of Independence: Independent  Current Functional Level Cognition  Arousal/Alertness: Awake/alert Overall Cognitive Status: Impaired/Different from baseline Current Attention Level: Sustained Orientation Level: Oriented X4 Following Commands: Follows one step commands inconsistently, Follows one step commands with increased time Safety/Judgement: Decreased awareness of safety, Decreased awareness of deficits General Comments: Pt reporting, "if I had my car, I would walk down to the parking lot and drive to get myself someone to eat," after requiring 2 person assist to get to chair. Attention: Sustained Sustained Attention: Impaired Sustained Attention Impairment: Functional basic (pt with decreased attention to remove phone from ear when finished talking) Memory: Impaired Memory Impairment: Storage deficit, Retrieval deficit, Decreased recall of new information, Decreased short term memory, Prospective memory Decreased Short Term Memory: Verbal complex Awareness: Impaired Awareness Impairment: Intellectual impairment (to left sided weakness and facial deficits) Problem Solving: Impaired (did not call for assist to use call bell for assistance) Safety/Judgment: Impaired   Extremity Assessment (includes Sensation/Coordination)  Upper Extremity Assessment: Defer to OT evaluation LUE Deficits / Details: Fine and gross motor AROM 0; Will assess in supine next session Lower Extremity Assessment: LLE deficits/detail;Generalized weakness;RLE deficits/detail RLE Deficits  / Details: Decreased functional strength noted as pt not able to stand on RLE without knee buckling. LLE Deficits / Details: No AROM knee extension, able to perform knee flexion AROM with cues, no ankle/hip AROM. Grossly ~0/5 knee extension, 0/5 DF, 2/5 knee flexion.    ADLs  Overall ADL's : Needs assistance/impaired Grooming: Wash/dry face, Supervision/safety, Sitting Grooming Details (indicate cue type and reason): Pt able to wipe drool from mouth. Lower Body Dressing: Total assistance, Sitting/lateral leans Lower Body Dressing Details (indicate cue type and reason): Total (A) to don socks. Pt unable to bring R foot across knee without severe L lateral lean and complete LOB while seated EOB. Functional mobility during ADLs: Moderate assistance, +2 for physical assistance General ADL Comments: Pt with significant L lateral lean during static sitting. Pt already demonstrating compensatory strategies by using RUE to move LUE. Mod +2 (A) for functional mobility due to pt uanble to initiate movement with LLE during transfers in today's sessions    Mobility  Overal bed mobility: Needs Assistance Bed Mobility: Supine to Sit Supine to sit: Min assist General bed mobility comments: Heavy use of rails and VC's for technique. Multiple attempts to get to EOB. Increased effort.    Transfers  Overall transfer level: Needs assistance Equipment used: 2 person hand held assist Transfers: Sit to/from Stand, Stand Pivot Transfers Sit to Stand: Mod assist, +2 physical assistance Stand pivot transfers: Mod assist, +2 physical assistance General transfer comment: Mod +2 (A) for sit<>stand with blocking of L knee. Pt required assist for boost to stand and to maintain balance. Verbal cues to pick head up and stand up tall. Mod +2 (A) for stand-pivot to recliner with blocking of L knee and manual (A) to move L foot back. Pt attempted to hop on RLE with knee buckling noted.     Ambulation / Gait /  Stairs / Wheelchair Mobility  Ambulation/Gait General Gait Details: Deferred.    Posture / Balance Dynamic Sitting Balance Sitting balance - Comments: Requires Miin-Mod A to maintain sitting balance, able to  sit unsupported for short periods but fatigues resulting in left lateral lean.    Special needs/care consideration BiPAP/CPAP No CPM No Continuous Drip IV No Dialysis No  Life Vest No Oxygen No Special Bed No Trach Size No Wound Vac (area) No  Skin No  Bowel mgmt: Last BM 10/16/14 with incontinence and loose BM Bladder mgmt: Voiding with incontinence Diabetic mgmt "Borderline diabetic per family"    Previous Home Environment Living Arrangements: Spouse/significant other Type of Home: House Home Layout: One level Home Access: Stairs to enter Entrance Stairs-Rails: None Entrance Stairs-Number of Steps: 5 Bathroom Shower/Tub: Engineer, manufacturing systemsTub/shower unit Bathroom Toilet: Standard Home Care Services: No Additional Comments: Still driving  Discharge Living Setting Plans for Discharge Living Setting: House, Lives with (comment) (Lives with GF, Charlies ConstableLynette Campbell) Type of Home at Discharge: House Discharge Home Layout: One level Discharge Home Access: Stairs to enter Entrance Stairs-Number of Steps: 3 steps but steep hill to porch and door to house. Does the patient have any problems obtaining your medications?: No  Social/Family/Support Systems Patient Roles: Parent, Other (Comment) (Has a GF, 3 children out of state, and a sister.) Contact Information: Edson Snowballyisha Vaughn - daughter [phone] Anticipated Caregiver: Not sure. ? may go to a SNF in [place] with daughter, Modena Nunneryyisha Anticipated Caregiver's Contact Information: See above Ability/Limitations of Caregiver: Girlfriend works. Dtr in town from [place]. Has 2 sons in [place]. Also has a sister. Family all work. Caregiver Availability: Other (Comment) (Not sure who will provide care.  May need SNF after CIR.) Discharge Plan Discussed with Primary Caregiver: Yes Is Caregiver In Agreement with Plan?: Yes Does Caregiver/Family have Issues with Lodging/Transportation while Pt is in Rehab?: No  Goals/Additional Needs Patient/Family Goal for Rehab: PT/OT/ST min assist goals Expected length of stay: 18-22 days Cultural Considerations: None Dietary Needs: Dys1, nectar thick liquids Equipment Needs: TBD Pt/Family Agrees to Admission and willing to participate: Yes Program Orientation Provided & Reviewed with Pt/Caregiver Including Roles & Responsibilities: Yes  Decrease burden of Care through IP rehab admission: Diet advancement, Decrease number of caregivers and Patient/family education  Possible need for SNF placement upon discharge: Yes. Dtr is interested in placement if [place] if SNF is needed after inpatient rehab stay.  Patient Condition: This patient's condition remains as documented in the consult dated 10/18/14, in which the Rehabilitation Physician determined and documented that the patient's condition is appropriate for intensive rehabilitative care in an inpatient rehabilitation facility. Will admit to inpatient rehab today.  Preadmission Screen Completed By: Trish MageLogue, Dardan Shelton M, 10/18/2014 11:52 AM ______________________________________________________________________  Discussed status with Dr. Wynn BankerKirsteins on 10/18/14 at 1151 and received telephone approval for admission today.  Admission Coordinator: Trish MageLogue, Labrittany Wechter M, time1151/Date11/27/15          Cosigned by: Ranelle OysterZachary T Swartz, MD at 10/18/2014 12:13 PM  Revision History

## 2014-10-18 NOTE — Progress Notes (Signed)
Speech Language Pathology Treatment: Dysphagia  Patient Details Name: Cory SleightStephen [name] MRN: [id number] DOB: Oct 23, 1961 Today's Date: 10/18/2014 Time: [id number] SLP Time Calculation (min) (ACUTE ONLY): 16 min  Assessment / Plan / Recommendation Clinical Impression  Pt with awareness to his dysphagia today stating that he chokes on his drinks but reports this is diminished with nectar thickened items.     NT reports pt declining to be fed and having left anterior spillage of all boluses.  Poor mastication abilities with eggs today reported.  SLP used a straw placed to right with improved oral containment.   Observed pt being fed crushed medicine with applesauce with delayed oral transiting but no s/s of aspiration.  Pt does require moderate verbal cues to swallow at times - ? Initiation difficulties - but responds well.  He remains impulsive and attempts to take juice before clearing pills from oral cavity.    Note pooled secretions remain in oral cavity - instructed pt to focus on swallowing secretions as able- max cues required.  Oral suctioning of secretions provided after pt observed coughing - suspect secretion penetration or aspiration.    Recommend to down grade diet to maximize swallow safety and efficiency.  Using teach back and posting of signs precautions reinforced.     HPI HPI: 53 y.o. male with history of alcoholism was brought to the ER with left facial and left-sided hemiplegia. CT head showed features consistent for CVA on the right MCA territory. CXR Suboptimal inspiration. No acute cardiopulmonary disease.Marland Kitchen.  Poor performance on bedside swallow eval with recs for MBS. MBS completed and diet recommendation was for dys2/nectar due to incr asp risk with thin.  SLE ordered.    Pertinent Vitals Pain Assessment: No/denies pain  SLP Plan  Continue with current plan of care    Recommendations Diet recommendations: Dysphagia 1 (puree);Nectar-thick liquid Liquids provided via: Straw  (use straw on right side ) Medication Administration: Crushed with puree Supervision: Patient able to self feed;Full supervision/cueing for compensatory strategies Compensations: Small sips/bites;Slow rate;Check for pocketing;Check for anterior loss (oral suction after meals) Postural Changes and/or Swallow Maneuvers: Seated upright 90 degrees              Oral Care Recommendations: Oral care BID Follow up Recommendations: Inpatient Rehab Plan: Continue with current plan of care    GO     Cory Burnetamara [name], MS [facility] SLP [phone]

## 2014-10-18 NOTE — Progress Notes (Signed)
Patient being transferred to rehab, report called to the receiving nurse.

## 2014-10-18 NOTE — Progress Notes (Signed)
Patient received at approximately 1520. Patient alert and oriented x 3-4 . Patient very sleepy at this time . Oriented patient and daughter to room and call bell system  Patient and daughter verbalized understanding of admission process. Left ankle support brace intact . Continue with plan of  Care.  Cleotilde NeerJoyce, Montrice Montuori S

## 2014-10-18 NOTE — Consult Note (Signed)
Physical Medicine and Rehabilitation Consult Reason for Consult: Left sided weakness, slurred speech, left facial weakness Referring Physician:  Dr. Jerral Ralph   HPI: Cory [name] is a 53 y.o. RH- male with history of HTN, Hyperlipidemia, heavy alcohol use, noncompliance with meds; who was admitted on 10/15/14 with left facial droop, slurred speech, left sided weakness and somnolence. CT head with ischemic changes in R-MCA territory affecting frontal cortex, anterior coronal radiata and dorsal striatum.  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  Carotid dopplers limited due to poor cooperation--R-ICA with 1-30% ICA stenosis. TEE done revealing EF 55-65% with no PFO and increased RA pressure.  Hypercoagulopathy panel pending. MBS done revealing moderate oral and mild pharyngeal dysphagia and patient placed on dysphagia 2, nectar liquids. Neurology recommending ASA for secondary stroke prevention. Therapy evaluations done 11/25 and patient limited by dense L- hemiplegia, poor truncal control with impaired poor safety awareness. CIR recommended by rehab team and MD for follow up therapy.   Pt without c/o States he has help at home "me" Review of Systems  Unable to perform ROS: mental acuity      Past Medical History  Diagnosis Date  . Medical history non-contributory    Past Surgical History  Procedure Laterality Date  . No past surgeries     Family History  Problem Relation Age of Onset  . Family history unknown: Yes   Social History:  reports that he has quit smoking. He does not have any smokeless tobacco history on file. He reports that he drinks alcohol. He reports that he does not use illicit drugs. Allergies: No Known Allergies Medications Prior to Admission  Medication Sig Dispense Refill  . HYDROcodone-acetaminophen (NORCO/VICODIN) 5-325 MG per tablet Take 1-2  tablets by mouth every 4 (four) hours as needed. 12 tablet 0  . ibuprofen (ADVIL,MOTRIN) 200 MG tablet Take 200 mg by mouth every 6 (six) hours as needed for mild pain.      Home: Home Living Family/patient expects to be discharged to:: Private residence Living Arrangements: Spouse/significant other Type of Home: House Home Access: Stairs to enter Secretary/administrator of Steps: 5 Entrance Stairs-Rails: None Home Layout: One level Home Equipment: Crutches Additional Comments: Still driving  Functional History: Prior Function Level of Independence: Independent Functional Status:  Mobility: Bed Mobility Overal bed mobility: Needs Assistance Bed Mobility: Supine to Sit Supine to sit: Min assist General bed mobility comments: Heavy use of rails and VC's for technique. Multiple attempts to get to EOB. Increased effort. Transfers Overall transfer level: Needs assistance Equipment used: 2 person hand held assist Transfers: Sit to/from Stand, Stand Pivot Transfers Sit to Stand: Mod assist, +2 physical assistance Stand pivot transfers: Mod assist, +2 physical assistance General transfer comment: Mod +2 (A) for sit<>stand with blocking of L knee. Pt required assist for boost to stand and to maintain balance. Verbal cues to pick head up and stand up tall. Mod +2 (A) for stand-pivot to recliner with blocking of L knee and manual (A) to move L foot back. Pt attempted to hop on RLE with knee buckling noted.  Ambulation/Gait General Gait Details: Deferred.    ADL: ADL Overall ADL's : Needs assistance/impaired Grooming: Wash/dry face, Supervision/safety, Sitting Grooming Details (indicate cue type and reason): Pt able to wipe drool from mouth. Lower Body Dressing: Total assistance, Sitting/lateral leans Lower Body Dressing Details (indicate cue type and reason): Total (  A) to don socks. Pt unable to bring R foot across knee without severe L lateral lean and complete LOB while seated  EOB. Functional mobility during ADLs: Moderate assistance, +2 for physical assistance General ADL Comments: Pt with significant L lateral lean during static sitting. Pt already demonstrating compensatory strategies by using RUE to move LUE. Mod +2 (A) for functional mobility due to pt uanble to initiate movement with LLE during transfers in today's sessions  Cognition: Cognition Overall Cognitive Status: Impaired/Different from baseline Orientation Level: Oriented X4 Cognition Arousal/Alertness: Awake/alert Behavior During Therapy: Flat affect Overall Cognitive Status: Impaired/Different from baseline Area of Impairment: Attention, Following commands, Safety/judgement, Awareness, Problem solving Current Attention Level: Sustained Memory: Decreased short-term memory Following Commands: Follows one step commands inconsistently, Follows one step commands with increased time Safety/Judgement: Decreased awareness of safety, Decreased awareness of deficits Awareness: Emergent Problem Solving: Difficulty sequencing, Requires verbal cues General Comments: Pt reporting, "if I had my car, I would walk down to the parking lot and drive to get myself someone to eat," after requiring 2 person assist to get to chair.  Blood pressure 142/99, pulse 83, temperature 98.8 F (37.1 C), temperature source Oral, resp. rate 18, height 5\' 8"  (1.727 m), weight 79.289 kg (174 lb 12.8 oz), SpO2 100 %. Physical Exam  Nursing note and vitals reviewed. Constitutional: He is oriented to person, place, and time. He appears well-developed and well-nourished.  HENT:  Head: Normocephalic.  Eyes: Conjunctivae are normal. Pupils are equal, round, and reactive to light.  Right gaze preference  Neck:  Patient does not turn neck to the right  Cardiovascular: Normal rate, regular rhythm and normal heart sounds.   Respiratory: Effort normal and breath sounds normal.  GI: Soft. Bowel sounds are normal. He exhibits no  distension. There is no tenderness. There is no guarding.  Genitourinary: Penis normal.  Musculoskeletal:  No pain with active range of motion or passive range of motion to the upper or lower extremities  Neurological: He is alert and oriented to person, place, and time.  Skin: Skin is warm and dry.  Psychiatric: His affect is blunt. His speech is delayed and slurred. He is slowed and withdrawn. He is inattentive.  Motor strength 0/5 in left deltoid, biceps, triceps, grip, hip flexor, trace knee extensor, 0 ankle dorsiflexion plantar flexion 5/5 on the right side in the same muscle groups Absent sensation in the left upper and left lower extremity Left lower facial droop Visual fields absent to confrontation testing on left side  Results for orders placed or performed during the hospital encounter of 10/15/14 (from the past 24 hour(s))  Basic metabolic panel     Status: Abnormal   Collection Time: 10/18/14  4:10 AM  Result Value Ref Range   Sodium 139 137 - 147 mEq/L   Potassium 4.1 3.7 - 5.3 mEq/L   Chloride 101 96 - 112 mEq/L   CO2 23 19 - 32 mEq/L   Glucose, Bld 102 (H) 70 - 99 mg/dL   BUN 17 6 - 23 mg/dL   Creatinine, Ser 0.98 0.50 - 1.35 mg/dL   Calcium 9.8 8.4 - 11.9 mg/dL   GFR calc non Af Amer >90 >90 mL/min   GFR calc Af Amer >90 >90 mL/min   Anion gap 15 5 - 15   Ct Angio Head W/cm &/or Wo Cm  10/16/2014   ADDENDUM REPORT: 10/16/2014 18:10  ADDENDUM: Study discussed by telephone with stroke provider SHARON BIBY on 10/16/2014 at 1802 hrs.  Electronically Signed   By: Augusto Gamble M.D.   On: 10/16/2014 18:10   10/16/2014   CLINICAL DATA:  53 year old male with left side weakness status post right MCA infarct diagnosed on recent MRI. Initial encounter.  EXAM: CT ANGIOGRAPHY HEAD AND NECK  TECHNIQUE: Multidetector CT imaging of the head and neck was performed using the standard protocol during bolus administration of intravenous contrast. Multiplanar CT image reconstructions and  MIPs were obtained to evaluate the vascular anatomy. Carotid stenosis measurements (when applicable) are obtained utilizing NASCET criteria, using the distal internal carotid diameter as the denominator.  CONTRAST:  80mL OMNIPAQUE IOHEXOL 350 MG/ML SOLN  COMPARISON:  Limited brain MRI 10/15/2014. Head CT without contrast 10/15/2014.  FINDINGS: CTA HEAD FINDINGS  Progression of cytotoxic edema in the right MCA territory, patchy in discontinuous in some places. Right caudate nucleus involvement. No associated petechial hemorrhage. Associated mass effect with partial effacement of the right lateral ventricle. Mild leftward midline shift at 2 mm is new. Left hemisphere and posterior fossa gray-white matter differentiation is stable. Patchy mostly gyriform enhancement in the right MCA territory following contrast administration.  Stable visualized osseous structures. Visualized paranasal sinuses and mastoids are clear. Visualized orbits and scalp soft tissues are within normal limits.  VASCULAR FINDINGS:  Major intracranial venous structures are enhancing.  Mildly dominant distal left vertebral artery. Normal PICA origins. Patent vertebrobasilar junction. No basilar artery stenosis. SCA origins are normal. Fetal type PCA origins, more so the right. Bilateral PCA branches are within normal limits.  Normal left ICA siphon except for dolichoectasia. Left ophthalmic and posterior communicating artery origins are within normal limits. Patent right ICA siphon with dolichoectasia but no stenosis. Ophthalmic and right posterior communicating artery origins are within normal limits. Tortuous posterior communicating arteries, more so the right. Patent carotid termini.  Occluded right MCA at its origin. Hyperdense right MCA now evident on noncontrast images. Minimal reconstituted flow in right MCA branches.  ACA and left MCA origins are normal. Dominant left A1 segment. Anterior communicating artery within normal limits. Tortuous  proximal ACAs. Visualized bilateral ACA branches otherwise within normal limits. Left MCA branches within normal limits.  Review of the MIP images confirms the above findings.  CTA NECK FINDINGS  Negative lung apices. No superior mediastinal lymphadenopathy. Thyroid, larynx, pharynx, parapharyngeal spaces, retropharyngeal space, sublingual space, submandibular glands, and parotid glands are within normal limits. No cervical lymphadenopathy. No acute osseous abnormality identified.  VASCULAR FINDINGS: Bovine arch configuration. No arch atherosclerosis or great vessel origin stenosis.  No right CCA origin stenosis. Normal right carotid bifurcation. Tortuous but otherwise negative cervical right ICA.  No proximal right subclavian artery stenosis. Normal right vertebral artery origin. Mildly non dominant right vertebral artery is normal to the skullbase.  No left CCA origin stenosis. Tortuous proximal left CCA. Normal left carotid bifurcation. Tortuous but otherwise negative cervical left ICA.  No proximal left subclavian artery stenosis. Normal left vertebral artery origin. Intermittently tortuous but otherwise normal left vertebral artery to the skullbase.  Review of the MIP images confirms the above findings.  IMPRESSION: 1. 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 2. Intracranial CTA otherwise negative except for dolichoectasia. 3. Negative neck CTA except for tortuous ICAs. No right carotid plaque or stenosis identified. 4. Expected evolution of overall moderate size right MCA infarct. Mass effect with mild leftward midline shift (2 mm). No hemorrhagic transformation.  Electronically Signed: By: Augusto Gamble M.D. On: 10/16/2014 17:57   Ct Angio Neck  W/cm &/or Wo/cm  10/16/2014   ADDENDUM REPORT: 10/16/2014 18:10  ADDENDUM: Study discussed by telephone with stroke provider SHARON BIBY on 10/16/2014 at 1802 hrs.   Electronically Signed   By: Augusto GambleLee  Hall M.D.   On: 10/16/2014 18:10    10/16/2014   CLINICAL DATA:  53 year old male with left side weakness status post right MCA infarct diagnosed on recent MRI. Initial encounter.  EXAM: CT ANGIOGRAPHY HEAD AND NECK  TECHNIQUE: Multidetector CT imaging of the head and neck was performed using the standard protocol during bolus administration of intravenous contrast. Multiplanar CT image reconstructions and MIPs were obtained to evaluate the vascular anatomy. Carotid stenosis measurements (when applicable) are obtained utilizing NASCET criteria, using the distal internal carotid diameter as the denominator.  CONTRAST:  80mL OMNIPAQUE IOHEXOL 350 MG/ML SOLN  COMPARISON:  Limited brain MRI 10/15/2014. Head CT without contrast 10/15/2014.  FINDINGS: CTA HEAD FINDINGS  Progression of cytotoxic edema in the right MCA territory, patchy in discontinuous in some places. Right caudate nucleus involvement. No associated petechial hemorrhage. Associated mass effect with partial effacement of the right lateral ventricle. Mild leftward midline shift at 2 mm is new. Left hemisphere and posterior fossa gray-white matter differentiation is stable. Patchy mostly gyriform enhancement in the right MCA territory following contrast administration.  Stable visualized osseous structures. Visualized paranasal sinuses and mastoids are clear. Visualized orbits and scalp soft tissues are within normal limits.  VASCULAR FINDINGS:  Major intracranial venous structures are enhancing.  Mildly dominant distal left vertebral artery. Normal PICA origins. Patent vertebrobasilar junction. No basilar artery stenosis. SCA origins are normal. Fetal type PCA origins, more so the right. Bilateral PCA branches are within normal limits.  Normal left ICA siphon except for dolichoectasia. Left ophthalmic and posterior communicating artery origins are within normal limits. Patent right ICA siphon with dolichoectasia but no stenosis. Ophthalmic and right posterior communicating artery origins  are within normal limits. Tortuous posterior communicating arteries, more so the right. Patent carotid termini.  Occluded right MCA at its origin. Hyperdense right MCA now evident on noncontrast images. Minimal reconstituted flow in right MCA branches.  ACA and left MCA origins are normal. Dominant left A1 segment. Anterior communicating artery within normal limits. Tortuous proximal ACAs. Visualized bilateral ACA branches otherwise within normal limits. Left MCA branches within normal limits.  Review of the MIP images confirms the above findings.  CTA NECK FINDINGS  Negative lung apices. No superior mediastinal lymphadenopathy. Thyroid, larynx, pharynx, parapharyngeal spaces, retropharyngeal space, sublingual space, submandibular glands, and parotid glands are within normal limits. No cervical lymphadenopathy. No acute osseous abnormality identified.  VASCULAR FINDINGS: Bovine arch configuration. No arch atherosclerosis or great vessel origin stenosis.  No right CCA origin stenosis. Normal right carotid bifurcation. Tortuous but otherwise negative cervical right ICA.  No proximal right subclavian artery stenosis. Normal right vertebral artery origin. Mildly non dominant right vertebral artery is normal to the skullbase.  No left CCA origin stenosis. Tortuous proximal left CCA. Normal left carotid bifurcation. Tortuous but otherwise negative cervical left ICA.  No proximal left subclavian artery stenosis. Normal left vertebral artery origin. Intermittently tortuous but otherwise normal left vertebral artery to the skullbase.  Review of the MIP images confirms the above findings.  IMPRESSION: 1. 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 2. Intracranial CTA otherwise negative except for dolichoectasia. 3. Negative neck CTA except for tortuous ICAs. No right carotid plaque or stenosis identified. 4. Expected evolution of overall moderate size right MCA  infarct. Mass effect with mild  leftward midline shift (2 mm). No hemorrhagic transformation.  Electronically Signed: By: Augusto GambleLee  Hall M.D. On: 10/16/2014 17:57   Dg Swallowing Func-speech Pathology  10/16/2014   Carolan ShiverAmanda Laurice Couture, CCC-SLP     10/16/2014  2:55 PM Objective Swallowing Evaluation: Modified Barium Swallowing Study   Patient Details  Name: Cory [name] MRN: [id number] Date of Birth: Apr 30, 1961  Today's Date: 10/16/2014 Time: 1250-1314 SLP Time Calculation (min) (ACUTE ONLY): 24 min  Past Medical History:  Past Medical History  Diagnosis Date  . Medical history non-contributory    Past Surgical History:  Past Surgical History  Procedure Laterality Date  . No past surgeries     HPI:  53 y.o. male with history of alcoholism was brought to the ER  with left facial and left-sided hemiplegia. CT head showed  features consistent for CVA on the right MCA territory. CXR  Suboptimal inspiration. No acute cardiopulmonary disease.Marland Kitchen.  Poor  performance on bedside swallow eval with recs for MBS.      Assessment / Plan / Recommendation Clinical Impression  Dysphagia Diagnosis: Moderate oral phase dysphagia;Mild  pharyngeal phase dysphagia Clinical impression: Pt presents with a moderate oral, mild  pharyngeal dysphagia marked by significant deficits in sensation  with copious amounts of all materials spilling from left anterior  oral caviity without awareness.  There is also premature loss of  all POs over base of tongue, with thin liquids immediately being  aspirated without awareness.  Pharyngeal clearance of materials  was adequate with no residue post-swallow.  Pt extremely  impulsive, with difficulty waiting for instructions, following  commands, and lacking insight.  Recommend initiating a dysphagia  2 diet with nectar-thick liquids initially.  Pt will require full  supervision given cognitive deficits; will require cues during  meal to maintain safety.      Treatment Recommendation  Therapy as outlined in treatment plan below    Diet  Recommendation Dysphagia 2 (Fine chop);Nectar-thick liquid   Liquid Administration via: Cup Medication Administration: Whole meds with puree Supervision: Patient able to self feed;Full supervision/cueing  for compensatory strategies Compensations: Small sips/bites;Slow rate;Check for  pocketing;Check for anterior loss Postural Changes and/or Swallow Maneuvers: Seated upright 90  degrees    Other  Recommendations Oral Care Recommendations: Oral care BID Other Recommendations: Order thickener from pharmacy   Follow Up Recommendations   (tba)    Frequency and Duration min 3x week  2 weeks       SLP Swallow Goals     General Date of Onset: 10/15/14 HPI: 53 y.o. male with history of alcoholism was brought to the  ER with left facial and left-sided hemiplegia. CT head showed  features consistent for CVA on the right MCA territory. CXR  Suboptimal inspiration. No acute cardiopulmonary disease.Marland Kitchen.  Poor  performance on bedside swallow eval with recs for MBS.  Type of Study: Modified Barium Swallowing Study Reason for Referral: Objectively evaluate swallowing function Diet Prior to this Study: NPO Temperature Spikes Noted: No Respiratory Status: Room air History of Recent Intubation: No Behavior/Cognition: Alert;Cooperative;Pleasant mood Oral Cavity - Dentition: Poor condition;Missing dentition Oral Motor / Sensory Function: Impaired - see Bedside swallow  eval Self-Feeding Abilities: Able to feed self Patient Positioning: Upright in chair Baseline Vocal Quality: Low vocal intensity;Clear Volitional Cough: Strong Volitional Swallow: Able to elicit Anatomy: Within functional limits Pharyngeal Secretions: Not observed secondary MBS    Reason for Referral Objectively evaluate swallowing function   Oral Phase Oral Preparation/Oral Phase  Oral Phase: Impaired Oral - Solids Oral - Puree: Left anterior bolus loss;Weak lingual  manipulation;Incomplete tongue to palate contact Oral - Mechanical Soft: Weak lingual manipulation Oral  Phase - Comment Oral Phase - Comment: thin and nectar bolus loss from left of  mouth without awareness   Pharyngeal Phase Pharyngeal Phase Pharyngeal Phase: Impaired Pharyngeal - Nectar Pharyngeal - Nectar Cup: Premature spillage to pyriform sinuses Pharyngeal - Thin Pharyngeal - Thin Cup: Premature spillage to pyriform  sinuses;Penetration/Aspiration during swallow;Reduced  airway/laryngeal closure;Trace aspiration Penetration/Aspiration details (thin cup): Material enters  airway, passes BELOW cords without attempt by patient to eject  out (silent aspiration) Pharyngeal - Solids Pharyngeal - Puree: Premature spillage to valleculae Pharyngeal - Regular: Premature spillage to valleculae  Cervical Esophageal Phase    Amanda L. 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 Does the need for close, 24 hr/day medical supervision in concert with the patient's rehab needs make it unreasonable for this patient to be served in a less intensive setting? Yes 2. Co-Morbidities requiring supervision/potential complications: History of alcoholism 3. Due to bladder management, bowel management, safety, skin/wound care, disease management, medication administration and patient education, does the patient require 24 hr/day rehab nursing? Yes 4. Does the patient require coordinated care of a physician, rehab nurse, PT (1-2 hrs/day, 5 days/week), OT (1-2 hrs/day, 5 days/week) and SLP (0.5-1 hrs/day, 5 days/week) to address physical and functional deficits in the context of the above medical diagnosis(es)? Yes Addressing deficits in the following areas: balance, endurance, locomotion, strength, transferring, bowel/bladder control, bathing, dressing, feeding, grooming, toileting, cognition and swallowing 5. Can the patient actively  participate in an intensive therapy program of at least 3 hrs of therapy per day at least 5 days per week? Yes 6. The potential for patient to make measurable gains while on inpatient rehab is good 7. Anticipated functional outcomes upon discharge from inpatient rehab are min assist  with PT, min assist with OT, min assist with SLP. 8. Estimated rehab length of stay to reach the above functional goals is: 18-22 days 9. Does the patient have adequate social supports and living environment to accommodate these discharge functional goals? Potentially 10. Anticipated D/C setting: home versus SNF depending on  caregiver availability 11. Anticipated post D/C treatments: HH therapy 12. Overall Rehab/Functional Prognosis: good  RECOMMENDATIONS: This patient's condition is appropriate for continued rehabilitative care in the following setting: CIR Patient has agreed to participate in recommended program. Potentially Note that insurance prior authorization may be required for reimbursement for recommended care.  Comment: May be a reduced burden of care goal if there is no 24 7 caregiver    10/18/2014

## 2014-10-18 NOTE — Consult Note (Signed)
Reason for Consult: CVA possible PAF as cause.  Referring Physician: Leonie Man  No PCP Per Patient Primary Cardiologist:new  Mose Colaizzi is an 53 y.o. male.    Chief Complaint: admitted 10/15/14 with CVA   HPI: 53 y.o. male history of hypertension and hyperlipidemia with noncompliance with treatment, presenting with new onset slurred speech, left facial droop and left hemiplegia. He has no previous history of stroke nor TIA. He was last seen well by his girlfriend on the morning of 10/14/2014. She noticed that he was sleeping more than usual the afternoon of admit. Patient is known to drink alcohol heavily. He was brought to the emergency room by EMS and code stroke was activated. CT scan of his head showed changes involving the right MCA territory consistent with large acute ischemic infarction. NIH stroke score was 16. Patient was not administered TPA secondary to beyond time under for treatment consideration, and ischemic changes on CT scan. He was admitted for further evaluation and treatment. + weakness of lt. Upper and lt. Lower ext.   TEE on the 25th revealed normal EF, no thrombus, normal bubble study, no PFO and trace MR.   2D Echo:   - Left ventricle: The cavity size was normal. Wall thickness was increased in a pattern of mild LVH. Systolic function was normal. The estimated ejection fraction was in the range of 55% to 60%. Wall motion was normal; there were no regional wall motion abnormalities. Doppler parameters are consistent with abnormal left ventricular relaxation (grade 1 diastolic dysfunction). Impressions: - No cardiac source of emboli was identified.  Troponin was neg., tox screen neg on admit and ETOH was 103.  Past Medical History  Diagnosis Date  . Medical history non-contributory     Past Surgical History  Procedure Laterality Date  . No past surgeries      Family History  Problem Relation Age of Onset  . Stroke Mother   .  Stroke Sister    Social History:  reports that he has quit smoking. He does not have any smokeless tobacco history on file. He reports that he drinks alcohol. He reports that he does not use illicit drugs.  Allergies: No Known Allergies  Medications Prior to Admission  Medication Sig Dispense Refill  . HYDROcodone-acetaminophen (NORCO/VICODIN) 5-325 MG per tablet Take 1-2 tablets by mouth every 4 (four) hours as needed. 12 tablet 0  . ibuprofen (ADVIL,MOTRIN) 200 MG tablet Take 200 mg by mouth every 6 (six) hours as needed for mild pain.      Results for orders placed or performed during the hospital encounter of 10/15/14 (from the past 48 hour(s))  Basic metabolic panel     Status: Abnormal   Collection Time: 10/16/14 11:53 AM  Result Value Ref Range   Sodium 137 137 - 147 mEq/L   Potassium 4.0 3.7 - 5.3 mEq/L   Chloride 100 96 - 112 mEq/L   CO2 24 19 - 32 mEq/L   Glucose, Bld 89 70 - 99 mg/dL   BUN 16 6 - 23 mg/dL   Creatinine, Ser 1.10 0.50 - 1.35 mg/dL   Calcium 9.2 8.4 - 10.5 mg/dL   GFR calc non Af Amer 74 (L) >90 mL/min   GFR calc Af Amer 86 (L) >90 mL/min    Comment: (NOTE) The eGFR has been calculated using the CKD EPI equation. This calculation has not been validated in all clinical situations. eGFR's persistently <90 mL/min signify possible Chronic Kidney  Disease.    Anion gap 13 5 - 15  Protime-INR     Status: None   Collection Time: 10/16/14 11:53 AM  Result Value Ref Range   Prothrombin Time 14.1 11.6 - 15.2 seconds   INR 1.08 0.00 - 2.58  Basic metabolic panel     Status: Abnormal   Collection Time: 10/18/14  4:10 AM  Result Value Ref Range   Sodium 139 137 - 147 mEq/L   Potassium 4.1 3.7 - 5.3 mEq/L   Chloride 101 96 - 112 mEq/L   CO2 23 19 - 32 mEq/L   Glucose, Bld 102 (H) 70 - 99 mg/dL   BUN 17 6 - 23 mg/dL   Creatinine, Ser 0.95 0.50 - 1.35 mg/dL   Calcium 9.8 8.4 - 10.5 mg/dL   GFR calc non Af Amer >90 >90 mL/min   GFR calc Af Amer >90 >90 mL/min      Comment: (NOTE) The eGFR has been calculated using the CKD EPI equation. This calculation has not been validated in all clinical situations. eGFR's persistently <90 mL/min signify possible Chronic Kidney Disease.    Anion gap 15 5 - 15   Ct Angio Head W/cm &/or Wo Cm  10/16/2014   ADDENDUM REPORT: 10/16/2014 18:10  ADDENDUM: Study discussed by telephone with stroke provider SHARON BIBY on 10/16/2014 at 1802 hrs.   Electronically Signed   By: Lars Pinks M.D.   On: 10/16/2014 18:10   10/16/2014   CLINICAL DATA:  53 year old male with left side weakness status post right MCA infarct diagnosed on recent MRI. Initial encounter.  EXAM: CT ANGIOGRAPHY HEAD AND NECK  TECHNIQUE: Multidetector CT imaging of the head and neck was performed using the standard protocol during bolus administration of intravenous contrast. Multiplanar CT image reconstructions and MIPs were obtained to evaluate the vascular anatomy. Carotid stenosis measurements (when applicable) are obtained utilizing NASCET criteria, using the distal internal carotid diameter as the denominator.  CONTRAST:  77m OMNIPAQUE IOHEXOL 350 MG/ML SOLN  COMPARISON:  Limited brain MRI 10/15/2014. Head CT without contrast 10/15/2014.  FINDINGS: CTA HEAD FINDINGS  Progression of cytotoxic edema in the right MCA territory, patchy in discontinuous in some places. Right caudate nucleus involvement. No associated petechial hemorrhage. Associated mass effect with partial effacement of the right lateral ventricle. Mild leftward midline shift at 2 mm is new. Left hemisphere and posterior fossa gray-white matter differentiation is stable. Patchy mostly gyriform enhancement in the right MCA territory following contrast administration.  Stable visualized osseous structures. Visualized paranasal sinuses and mastoids are clear. Visualized orbits and scalp soft tissues are within normal limits.  VASCULAR FINDINGS:  Major intracranial venous structures are enhancing.   Mildly dominant distal left vertebral artery. Normal PICA origins. Patent vertebrobasilar junction. No basilar artery stenosis. SCA origins are normal. Fetal type PCA origins, more so the right. Bilateral PCA branches are within normal limits.  Normal left ICA siphon except for dolichoectasia. Left ophthalmic and posterior communicating artery origins are within normal limits. Patent right ICA siphon with dolichoectasia but no stenosis. Ophthalmic and right posterior communicating artery origins are within normal limits. Tortuous posterior communicating arteries, more so the right. Patent carotid termini.  Occluded right MCA at its origin. Hyperdense right MCA now evident on noncontrast images. Minimal reconstituted flow in right MCA branches.  ACA and left MCA origins are normal. Dominant left A1 segment. Anterior communicating artery within normal limits. Tortuous proximal ACAs. Visualized bilateral ACA branches otherwise within normal limits. Left MCA branches  within normal limits.  Review of the MIP images confirms the above findings.  CTA NECK FINDINGS  Negative lung apices. No superior mediastinal lymphadenopathy. Thyroid, larynx, pharynx, parapharyngeal spaces, retropharyngeal space, sublingual space, submandibular glands, and parotid glands are within normal limits. No cervical lymphadenopathy. No acute osseous abnormality identified.  VASCULAR FINDINGS: Bovine arch configuration. No arch atherosclerosis or great vessel origin stenosis.  No right CCA origin stenosis. Normal right carotid bifurcation. Tortuous but otherwise negative cervical right ICA.  No proximal right subclavian artery stenosis. Normal right vertebral artery origin. Mildly non dominant right vertebral artery is normal to the skullbase.  No left CCA origin stenosis. Tortuous proximal left CCA. Normal left carotid bifurcation. Tortuous but otherwise negative cervical left ICA.  No proximal left subclavian artery stenosis. Normal left  vertebral artery origin. Intermittently tortuous but otherwise normal left vertebral artery to the skullbase.  Review of the MIP images confirms the above findings.  IMPRESSION: 1. 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 2. Intracranial CTA otherwise negative except for dolichoectasia. 3. Negative neck CTA except for tortuous ICAs. No right carotid plaque or stenosis identified. 4. Expected evolution of overall moderate size right MCA infarct. Mass effect with mild leftward midline shift (2 mm). No hemorrhagic transformation.  Electronically Signed: By: Lars Pinks M.D. On: 10/16/2014 17:57   Ct Angio Neck W/cm &/or Wo/cm  10/16/2014   ADDENDUM REPORT: 10/16/2014 18:10  ADDENDUM: Study discussed by telephone with stroke provider SHARON BIBY on 10/16/2014 at 1802 hrs.   Electronically Signed   By: Lars Pinks M.D.   On: 10/16/2014 18:10   10/16/2014   CLINICAL DATA:  53 year old male with left side weakness status post right MCA infarct diagnosed on recent MRI. Initial encounter.  EXAM: CT ANGIOGRAPHY HEAD AND NECK  TECHNIQUE: Multidetector CT imaging of the head and neck was performed using the standard protocol during bolus administration of intravenous contrast. Multiplanar CT image reconstructions and MIPs were obtained to evaluate the vascular anatomy. Carotid stenosis measurements (when applicable) are obtained utilizing NASCET criteria, using the distal internal carotid diameter as the denominator.  CONTRAST:  52m OMNIPAQUE IOHEXOL 350 MG/ML SOLN  COMPARISON:  Limited brain MRI 10/15/2014. Head CT without contrast 10/15/2014.  FINDINGS: CTA HEAD FINDINGS  Progression of cytotoxic edema in the right MCA territory, patchy in discontinuous in some places. Right caudate nucleus involvement. No associated petechial hemorrhage. Associated mass effect with partial effacement of the right lateral ventricle. Mild leftward midline shift at 2 mm is new. Left hemisphere and posterior  fossa gray-white matter differentiation is stable. Patchy mostly gyriform enhancement in the right MCA territory following contrast administration.  Stable visualized osseous structures. Visualized paranasal sinuses and mastoids are clear. Visualized orbits and scalp soft tissues are within normal limits.  VASCULAR FINDINGS:  Major intracranial venous structures are enhancing.  Mildly dominant distal left vertebral artery. Normal PICA origins. Patent vertebrobasilar junction. No basilar artery stenosis. SCA origins are normal. Fetal type PCA origins, more so the right. Bilateral PCA branches are within normal limits.  Normal left ICA siphon except for dolichoectasia. Left ophthalmic and posterior communicating artery origins are within normal limits. Patent right ICA siphon with dolichoectasia but no stenosis. Ophthalmic and right posterior communicating artery origins are within normal limits. Tortuous posterior communicating arteries, more so the right. Patent carotid termini.  Occluded right MCA at its origin. Hyperdense right MCA now evident on noncontrast images. Minimal reconstituted flow in right MCA branches.  ACA and  left MCA origins are normal. Dominant left A1 segment. Anterior communicating artery within normal limits. Tortuous proximal ACAs. Visualized bilateral ACA branches otherwise within normal limits. Left MCA branches within normal limits.  Review of the MIP images confirms the above findings.  CTA NECK FINDINGS  Negative lung apices. No superior mediastinal lymphadenopathy. Thyroid, larynx, pharynx, parapharyngeal spaces, retropharyngeal space, sublingual space, submandibular glands, and parotid glands are within normal limits. No cervical lymphadenopathy. No acute osseous abnormality identified.  VASCULAR FINDINGS: Bovine arch configuration. No arch atherosclerosis or great vessel origin stenosis.  No right CCA origin stenosis. Normal right carotid bifurcation. Tortuous but otherwise negative  cervical right ICA.  No proximal right subclavian artery stenosis. Normal right vertebral artery origin. Mildly non dominant right vertebral artery is normal to the skullbase.  No left CCA origin stenosis. Tortuous proximal left CCA. Normal left carotid bifurcation. Tortuous but otherwise negative cervical left ICA.  No proximal left subclavian artery stenosis. Normal left vertebral artery origin. Intermittently tortuous but otherwise normal left vertebral artery to the skullbase.  Review of the MIP images confirms the above findings.  IMPRESSION: 1. 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 2. Intracranial CTA otherwise negative except for dolichoectasia. 3. Negative neck CTA except for tortuous ICAs. No right carotid plaque or stenosis identified. 4. Expected evolution of overall moderate size right MCA infarct. Mass effect with mild leftward midline shift (2 mm). No hemorrhagic transformation.  Electronically Signed: By: Lars Pinks M.D. On: 10/16/2014 17:57   Dg Swallowing Func-speech Pathology  10/16/2014   Assunta Curtis, CCC-SLP     10/16/2014  2:55 PM Objective Swallowing Evaluation: Modified Barium Swallowing Study   Patient Details  Name: Bonner Larue MRN: [id number] Date of Birth: 12/15/1960  Today's Date: 10/16/2014 Time: 1250-1314 SLP Time Calculation (min) (ACUTE ONLY): 24 min  Past Medical History:  Past Medical History  Diagnosis Date  . Medical history non-contributory    Past Surgical History:  Past Surgical History  Procedure Laterality Date  . No past surgeries     HPI:  53 y.o. male with history of alcoholism was brought to the ER  with left facial and left-sided hemiplegia. CT head showed  features consistent for CVA on the right MCA territory. CXR  Suboptimal inspiration. No acute cardiopulmonary disease.Marland Kitchen  Poor  performance on bedside swallow eval with recs for MBS.      Assessment / Plan / Recommendation Clinical Impression  Dysphagia  Diagnosis: Moderate oral phase dysphagia;Mild  pharyngeal phase dysphagia Clinical impression: Pt presents with a moderate oral, mild  pharyngeal dysphagia marked by significant deficits in sensation  with copious amounts of all materials spilling from left anterior  oral caviity without awareness.  There is also premature loss of  all POs over base of tongue, with thin liquids immediately being  aspirated without awareness.  Pharyngeal clearance of materials  was adequate with no residue post-swallow.  Pt extremely  impulsive, with difficulty waiting for instructions, following  commands, and lacking insight.  Recommend initiating a dysphagia  2 diet with nectar-thick liquids initially.  Pt will require full  supervision given cognitive deficits; will require cues during  meal to maintain safety.      Treatment Recommendation  Therapy as outlined in treatment plan below    Diet Recommendation Dysphagia 2 (Fine chop);Nectar-thick liquid   Liquid Administration via: Cup Medication Administration: Whole meds with puree Supervision: Patient able to self feed;Full supervision/cueing  for compensatory strategies Compensations: Small sips/bites;Slow  rate;Check for  pocketing;Check for anterior loss Postural Changes and/or Swallow Maneuvers: Seated upright 90  degrees    Other  Recommendations Oral Care Recommendations: Oral care BID Other Recommendations: Order thickener from pharmacy   Follow Up Recommendations   (tba)    Frequency and Duration min 3x week  2 weeks       SLP Swallow Goals     General Date of Onset: 10/15/14 HPI: 53 y.o. male with history of alcoholism was brought to the  ER with left facial and left-sided hemiplegia. CT head showed  features consistent for CVA on the right MCA territory. CXR  Suboptimal inspiration. No acute cardiopulmonary disease.Marland Kitchen  Poor  performance on bedside swallow eval with recs for MBS.  Type of Study: Modified Barium Swallowing Study Reason for Referral: Objectively evaluate  swallowing function Diet Prior to this Study: NPO Temperature Spikes Noted: No Respiratory Status: Room air History of Recent Intubation: No Behavior/Cognition: Alert;Cooperative;Pleasant mood Oral Cavity - Dentition: Poor condition;Missing dentition Oral Motor / Sensory Function: Impaired - see Bedside swallow  eval Self-Feeding Abilities: Able to feed self Patient Positioning: Upright in chair Baseline Vocal Quality: Low vocal intensity;Clear Volitional Cough: Strong Volitional Swallow: Able to elicit Anatomy: Within functional limits Pharyngeal Secretions: Not observed secondary MBS    Reason for Referral Objectively evaluate swallowing function   Oral Phase Oral Preparation/Oral Phase Oral Phase: Impaired Oral - Solids Oral - Puree: Left anterior bolus loss;Weak lingual  manipulation;Incomplete tongue to palate contact Oral - Mechanical Soft: Weak lingual manipulation Oral Phase - Comment Oral Phase - Comment: thin and nectar bolus loss from left of  mouth without awareness   Pharyngeal Phase Pharyngeal Phase Pharyngeal Phase: Impaired Pharyngeal - Nectar Pharyngeal - Nectar Cup: Premature spillage to pyriform sinuses Pharyngeal - Thin Pharyngeal - Thin Cup: Premature spillage to pyriform  sinuses;Penetration/Aspiration during swallow;Reduced  airway/laryngeal closure;Trace aspiration Penetration/Aspiration details (thin cup): Material enters  airway, passes BELOW cords without attempt by patient to eject  out (silent aspiration) Pharyngeal - Solids Pharyngeal - Puree: Premature spillage to valleculae Pharyngeal - Regular: Premature spillage to valleculae  Cervical Esophageal Phase    Amanda L. Tivis Ringer, MA CCC/SLP Pager [phone]              Juan Quam Laurice 10/16/2014, 2:54 PM     ROS: General:no colds or fevers, no weight changes Skin:no rashes or ulcers HEENT:no blurred vision, no congestion CV:see HPI- no chest pain, no SOB- no awareness of rapid or irregular HR. PUL:see HPI GI:no diarrhea  constipation or melena, no indigestion GU:no hematuria, no dysuria MS:no joint pain, no claudication Neuro:no syncope, no lightheadedness Endo:no diabetes, no thyroid disease   Blood pressure 142/99, pulse 83, temperature 98.8 F (37.1 C), temperature source Oral, resp. rate 18, height _0  (1.727 m), weight 174 lb 12.8 oz (79.289 kg), SpO2 100 %. PE: General:Pleasant affect, NAD, falls to sleep easily Skin:Warm and dry, brisk capillary refill HEENT:normocephalic, sclera clear, mucus membranes moist Neck:supple, no JVD, no bruits  Heart:S1S2 RRR without murmur, gallup, rub or click Lungs:clear without rales, rhonchi, or wheezes MIW:OEHO, non tender, + BS, do not palpate liver spleen or masses Ext:no lower ext edema, 2+ pedal pulses, 2+ radial pulses Neuro:alert and oriented, moves rt arm and leg, none on L arm but can move left foot, follows commands, + lt facial droop    Tele:  SR with occ PVCs,  Rare couplets  EKG on admit:  Probable left atrial enlargement Borderline repolarization abnormality Borderline ST  elevation, anterior leads  Assessment/Plan Principal Problem:   Acute CVA (cerebrovascular accident)- negative TEE for embolus or PFO, poss PAF as cause of CVA- plan for linq to eval for a fib and possible need for anticoagulation.   One concern if + for a fib, pt admits to at least 6 beers per day.  He stated he would stop ETOH, he declined rehab.  Concern would be anticoagulation with an alcoholism- greater chance of GI bleed.   Active Problems:   Stroke   Alcoholism   Dyslipidemia    MQKMMN,OTRRN R  Nurse Practitioner Certified [place] Pager [phone] or after 5pm or weekends call [phone] 10/18/2014, 9:30 AM Presents with Cryptogenic Stroke and now for loop recorder given lack of incriminating findings here to date  We have broached the subject of alcohol interplay with anticoagulation  He turned his head to the side  He understands the  purpose and the plan for the procedure

## 2014-10-18 NOTE — Plan of Care (Signed)
Problem: Acute Treatment Outcomes Goal: BP within ordered parameters Outcome: Completed/Met Date Met:  10/18/14

## 2014-10-18 NOTE — Discharge Summary (Signed)
PATIENT DETAILS Name: Cory [name] Age: 53 y.o. Sex: male Date of Birth: Dec 20, 1960 MRN: [id number]. Admitting Physician: Eduard Clos, MD PCP:No PCP Per Patient  Admit Date: 10/15/2014 Discharge date: 10/18/2014  Recommendations for Outpatient Follow-up:  1. Please repeat lipid panel in 3 months.  PRIMARY DISCHARGE DIAGNOSIS:  Principal Problem:   Acute CVA (cerebrovascular accident) Active Problems:   Stroke   Alcoholism   Dyslipidemia      PAST MEDICAL HISTORY: Past Medical History  Diagnosis Date  . Medical history non-contributory     DISCHARGE MEDICATIONS: Current Discharge Medication List    START taking these medications   Details  aspirin 325 MG tablet Take 1 tablet (325 mg total) by mouth daily.    atorvastatin (LIPITOR) 20 MG tablet Take 1 tablet (20 mg total) by mouth daily at 6 PM.    folic acid (FOLVITE) 1 MG tablet Take 1 tablet (1 mg total) by mouth daily.    thiamine 100 MG tablet Take 1 tablet (100 mg total) by mouth daily.      STOP taking these medications     HYDROcodone-acetaminophen (NORCO/VICODIN) 5-325 MG per tablet      ibuprofen (ADVIL,MOTRIN) 200 MG tablet         ALLERGIES:  No Known Allergies  BRIEF HPI:  See H&P, Labs, Consult and Test reports for all details in brief, patient is a 53 year old male with a history of EtOH use was brought to the hospital after the patient's roommate a noted left facial droop and left hemiplegia.  CONSULTATIONS:   cardiology and neurology  PERTINENT RADIOLOGIC STUDIES: Ct Angio Head W/cm &/or Wo Cm  10/16/2014   ADDENDUM REPORT: 10/16/2014 18:10  ADDENDUM: Study discussed by telephone with stroke provider SHARON BIBY on 10/16/2014 at 1802 hrs.   Electronically Signed   By: Augusto Gamble M.D.   On: 10/16/2014 18:10   10/16/2014   CLINICAL DATA:  53 year old male with left side weakness status post right MCA infarct diagnosed on recent MRI. Initial encounter.  EXAM: CT ANGIOGRAPHY  HEAD AND NECK  TECHNIQUE: Multidetector CT imaging of the head and neck was performed using the standard protocol during bolus administration of intravenous contrast. Multiplanar CT image reconstructions and MIPs were obtained to evaluate the vascular anatomy. Carotid stenosis measurements (when applicable) are obtained utilizing NASCET criteria, using the distal internal carotid diameter as the denominator.  CONTRAST:  80mL OMNIPAQUE IOHEXOL 350 MG/ML SOLN  COMPARISON:  Limited brain MRI 10/15/2014. Head CT without contrast 10/15/2014.  FINDINGS: CTA HEAD FINDINGS  Progression of cytotoxic edema in the right MCA territory, patchy in discontinuous in some places. Right caudate nucleus involvement. No associated petechial hemorrhage. Associated mass effect with partial effacement of the right lateral ventricle. Mild leftward midline shift at 2 mm is new. Left hemisphere and posterior fossa gray-white matter differentiation is stable. Patchy mostly gyriform enhancement in the right MCA territory following contrast administration.  Stable visualized osseous structures. Visualized paranasal sinuses and mastoids are clear. Visualized orbits and scalp soft tissues are within normal limits.  VASCULAR FINDINGS:  Major intracranial venous structures are enhancing.  Mildly dominant distal left vertebral artery. Normal PICA origins. Patent vertebrobasilar junction. No basilar artery stenosis. SCA origins are normal. Fetal type PCA origins, more so the right. Bilateral PCA branches are within normal limits.  Normal left ICA siphon except for dolichoectasia. Left ophthalmic and posterior communicating artery origins are within normal limits. Patent right ICA siphon with dolichoectasia but no stenosis. Ophthalmic and  right posterior communicating artery origins are within normal limits. Tortuous posterior communicating arteries, more so the right. Patent carotid termini.  Occluded right MCA at its origin. Hyperdense right MCA now  evident on noncontrast images. Minimal reconstituted flow in right MCA branches.  ACA and left MCA origins are normal. Dominant left A1 segment. Anterior communicating artery within normal limits. Tortuous proximal ACAs. Visualized bilateral ACA branches otherwise within normal limits. Left MCA branches within normal limits.  Review of the MIP images confirms the above findings.  CTA NECK FINDINGS  Negative lung apices. No superior mediastinal lymphadenopathy. Thyroid, larynx, pharynx, parapharyngeal spaces, retropharyngeal space, sublingual space, submandibular glands, and parotid glands are within normal limits. No cervical lymphadenopathy. No acute osseous abnormality identified.  VASCULAR FINDINGS: Bovine arch configuration. No arch atherosclerosis or great vessel origin stenosis.  No right CCA origin stenosis. Normal right carotid bifurcation. Tortuous but otherwise negative cervical right ICA.  No proximal right subclavian artery stenosis. Normal right vertebral artery origin. Mildly non dominant right vertebral artery is normal to the skullbase.  No left CCA origin stenosis. Tortuous proximal left CCA. Normal left carotid bifurcation. Tortuous but otherwise negative cervical left ICA.  No proximal left subclavian artery stenosis. Normal left vertebral artery origin. Intermittently tortuous but otherwise normal left vertebral artery to the skullbase.  Review of the MIP images confirms the above findings.  IMPRESSION: 1. 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 2. Intracranial CTA otherwise negative except for dolichoectasia. 3. Negative neck CTA except for tortuous ICAs. No right carotid plaque or stenosis identified. 4. Expected evolution of overall moderate size right MCA infarct. Mass effect with mild leftward midline shift (2 mm). No hemorrhagic transformation.  Electronically Signed: By: Augusto GambleLee  Hall M.D. On: 10/16/2014 17:57   Ct Head Wo Contrast  10/15/2014   CLINICAL  DATA:  Left-sided weakness.  Code stroke.  EXAM: CT HEAD WITHOUT CONTRAST  TECHNIQUE: Contiguous axial images were obtained from the base of the skull through the vertex without intravenous contrast.  COMPARISON:  None.  FINDINGS: Skull and Sinuses:Negative for fracture or destructive process. The mastoids, middle ears, and imaged paranasal sinuses are clear.  Orbits: No acute abnormality.  Brain: Small area of low-density within the high and posterior right frontal cortex and subcortical white matter. In the same region, there is patchy low-density within the deep cerebral white matter. Focal low-density expansion of the right caudate nucleus (anterior body); the upper right putamen is difficult to visualize. Gray-white differentiation is somewhat blurred in the right lateral temporal lobe, but this finding is indeterminate due to image noise. No hemorrhage, hydrocephalus, or shift.  Critical Value/emergent results were called by telephone at the time of interpretation on 10/15/2014 at 1:15 am to Dr. Roseanne RenoStewart, who verbally acknowledged these results.  IMPRESSION: 1. Ischemic changes in the right MCA territory affecting the frontal cortex, anterior corona radiata, and dorsal striatum. 2. No acute intracranial hemorrhage.   Electronically Signed   By: Tiburcio PeaJonathan  Watts M.D.   On: 10/15/2014 01:22   Ct Angio Neck W/cm &/or Wo/cm  10/16/2014   ADDENDUM REPORT: 10/16/2014 18:10  ADDENDUM: Study discussed by telephone with stroke provider SHARON BIBY on 10/16/2014 at 1802 hrs.   Electronically Signed   By: Augusto GambleLee  Hall M.D.   On: 10/16/2014 18:10   10/16/2014   CLINICAL DATA:  53 year old male with left side weakness status post right MCA infarct diagnosed on recent MRI. Initial encounter.  EXAM: CT ANGIOGRAPHY HEAD AND NECK  TECHNIQUE: Multidetector CT imaging of the head and neck was performed using the standard protocol during bolus administration of intravenous contrast. Multiplanar CT image reconstructions and MIPs  were obtained to evaluate the vascular anatomy. Carotid stenosis measurements (when applicable) are obtained utilizing NASCET criteria, using the distal internal carotid diameter as the denominator.  CONTRAST:  80mL OMNIPAQUE IOHEXOL 350 MG/ML SOLN  COMPARISON:  Limited brain MRI 10/15/2014. Head CT without contrast 10/15/2014.  FINDINGS: CTA HEAD FINDINGS  Progression of cytotoxic edema in the right MCA territory, patchy in discontinuous in some places. Right caudate nucleus involvement. No associated petechial hemorrhage. Associated mass effect with partial effacement of the right lateral ventricle. Mild leftward midline shift at 2 mm is new. Left hemisphere and posterior fossa gray-white matter differentiation is stable. Patchy mostly gyriform enhancement in the right MCA territory following contrast administration.  Stable visualized osseous structures. Visualized paranasal sinuses and mastoids are clear. Visualized orbits and scalp soft tissues are within normal limits.  VASCULAR FINDINGS:  Major intracranial venous structures are enhancing.  Mildly dominant distal left vertebral artery. Normal PICA origins. Patent vertebrobasilar junction. No basilar artery stenosis. SCA origins are normal. Fetal type PCA origins, more so the right. Bilateral PCA branches are within normal limits.  Normal left ICA siphon except for dolichoectasia. Left ophthalmic and posterior communicating artery origins are within normal limits. Patent right ICA siphon with dolichoectasia but no stenosis. Ophthalmic and right posterior communicating artery origins are within normal limits. Tortuous posterior communicating arteries, more so the right. Patent carotid termini.  Occluded right MCA at its origin. Hyperdense right MCA now evident on noncontrast images. Minimal reconstituted flow in right MCA branches.  ACA and left MCA origins are normal. Dominant left A1 segment. Anterior communicating artery within normal limits. Tortuous  proximal ACAs. Visualized bilateral ACA branches otherwise within normal limits. Left MCA branches within normal limits.  Review of the MIP images confirms the above findings.  CTA NECK FINDINGS  Negative lung apices. No superior mediastinal lymphadenopathy. Thyroid, larynx, pharynx, parapharyngeal spaces, retropharyngeal space, sublingual space, submandibular glands, and parotid glands are within normal limits. No cervical lymphadenopathy. No acute osseous abnormality identified.  VASCULAR FINDINGS: Bovine arch configuration. No arch atherosclerosis or great vessel origin stenosis.  No right CCA origin stenosis. Normal right carotid bifurcation. Tortuous but otherwise negative cervical right ICA.  No proximal right subclavian artery stenosis. Normal right vertebral artery origin. Mildly non dominant right vertebral artery is normal to the skullbase.  No left CCA origin stenosis. Tortuous proximal left CCA. Normal left carotid bifurcation. Tortuous but otherwise negative cervical left ICA.  No proximal left subclavian artery stenosis. Normal left vertebral artery origin. Intermittently tortuous but otherwise normal left vertebral artery to the skullbase.  Review of the MIP images confirms the above findings.  IMPRESSION: 1. 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 2. Intracranial CTA otherwise negative except for dolichoectasia. 3. Negative neck CTA except for tortuous ICAs. No right carotid plaque or stenosis identified. 4. Expected evolution of overall moderate size right MCA infarct. Mass effect with mild leftward midline shift (2 mm). No hemorrhagic transformation.  Electronically Signed: By: Augusto Gamble M.D. On: 10/16/2014 17:57   Mr Brain Wo Contrast  10/15/2014   CLINICAL DATA:  53 year old male with left facial droop and left hemiplegia. History of alcoholism. Initial encounter.  EXAM: MRI HEAD WITHOUT CONTRAST  TECHNIQUE: Multiplanar, multiecho pulse sequences of the  brain and surrounding structures were obtained without intravenous contrast.  COMPARISON:  10/15/2014 head CT.  No comparison brain MR.  FINDINGS: Examination limited with a total of four sequences. Patient was not able to complete the exam.  Large right middle cerebral artery acute infarct involving portions of the right temporal lobe, right frontal lobe, right parietal lobe, right basal ganglia and corona radiata. Mild local mass effect upon the right lateral ventricle without midline shift.  No obvious intracranial hemorrhage.  No intracranial mass lesion seen on this limited exam  Limited evaluation of the upper cervical spine and intracranial vasculature. Cerebellar tonsils may be minimally low lying but within the range of normal limits.  IMPRESSION: Motion degraded limited exam with evidence of large right hemispheric acute infarct with mild local mass effect upon the right lateral ventricle as noted above.   Electronically Signed   By: Bridgett LarssonSteve  Olson M.D.   On: 10/15/2014 12:46   Dg Chest Port 1 View  10/15/2014   CLINICAL DATA:  Acute right MCA distribution stroke.  EXAM: PORTABLE CHEST - 1 VIEW  COMPARISON:  None.  FINDINGS: Suboptimal inspiration accounts for crowded bronchovascular markings, especially in the bases, and accentuates the cardiac silhouette. Taking this into account, cardiomediastinal silhouette unremarkable. Lungs clear. Bronchovascular markings normal. Pulmonary vascularity normal. No visible pleural effusions. No pneumothorax.  IMPRESSION: Suboptimal inspiration.  No acute cardiopulmonary disease.   Electronically Signed   By: Hulan Saashomas  Lawrence M.D.   On: 10/15/2014 07:52   Dg Swallowing Func-speech Pathology  10/16/2014   Carolan ShiverAmanda Laurice Couture, CCC-SLP     10/16/2014  2:55 PM Objective Swallowing Evaluation: Modified Barium Swallowing Study   Patient Details  Name: Naida SleightStephen Cargle MRN: [id number] Date of Birth: 1961-09-15  Today's Date: 10/16/2014 Time: 1250-1314 SLP Time  Calculation (min) (ACUTE ONLY): 24 min  Past Medical History:  Past Medical History  Diagnosis Date  . Medical history non-contributory    Past Surgical History:  Past Surgical History  Procedure Laterality Date  . No past surgeries     HPI:  53 y.o. male with history of alcoholism was brought to the ER  with left facial and left-sided hemiplegia. CT head showed  features consistent for CVA on the right MCA territory. CXR  Suboptimal inspiration. No acute cardiopulmonary disease.Marland Kitchen.  Poor  performance on bedside swallow eval with recs for MBS.      Assessment / Plan / Recommendation Clinical Impression  Dysphagia Diagnosis: Moderate oral phase dysphagia;Mild  pharyngeal phase dysphagia Clinical impression: Pt presents with a moderate oral, mild  pharyngeal dysphagia marked by significant deficits in sensation  with copious amounts of all materials spilling from left anterior  oral caviity without awareness.  There is also premature loss of  all POs over base of tongue, with thin liquids immediately being  aspirated without awareness.  Pharyngeal clearance of materials  was adequate with no residue post-swallow.  Pt extremely  impulsive, with difficulty waiting for instructions, following  commands, and lacking insight.  Recommend initiating a dysphagia  2 diet with nectar-thick liquids initially.  Pt will require full  supervision given cognitive deficits; will require cues during  meal to maintain safety.      Treatment Recommendation  Therapy as outlined in treatment plan below    Diet Recommendation Dysphagia 2 (Fine chop);Nectar-thick liquid   Liquid Administration via: Cup Medication Administration: Whole meds with puree Supervision: Patient able to self feed;Full supervision/cueing  for compensatory strategies Compensations: Small sips/bites;Slow rate;Check for  pocketing;Check for anterior loss Postural Changes and/or Swallow Maneuvers: Seated upright 90  degrees    Other  Recommendations Oral Care  Recommendations: Oral care BID Other Recommendations: Order thickener from pharmacy   Follow Up Recommendations   (tba)    Frequency and Duration min 3x week  2 weeks       SLP Swallow Goals     General Date of Onset: 10/15/14 HPI: 53 y.o. male with history of alcoholism was brought to the  ER with left facial and left-sided hemiplegia. CT head showed  features consistent for CVA on the right MCA territory. CXR  Suboptimal inspiration. No acute cardiopulmonary disease.Marland Kitchen  Poor  performance on bedside swallow eval with recs for MBS.  Type of Study: Modified Barium Swallowing Study Reason for Referral: Objectively evaluate swallowing function Diet Prior to this Study: NPO Temperature Spikes Noted: No Respiratory Status: Room air History of Recent Intubation: No Behavior/Cognition: Alert;Cooperative;Pleasant mood Oral Cavity - Dentition: Poor condition;Missing dentition Oral Motor / Sensory Function: Impaired - see Bedside swallow  eval Self-Feeding Abilities: Able to feed self Patient Positioning: Upright in chair Baseline Vocal Quality: Low vocal intensity;Clear Volitional Cough: Strong Volitional Swallow: Able to elicit Anatomy: Within functional limits Pharyngeal Secretions: Not observed secondary MBS    Reason for Referral Objectively evaluate swallowing function   Oral Phase Oral Preparation/Oral Phase Oral Phase: Impaired Oral - Solids Oral - Puree: Left anterior bolus loss;Weak lingual  manipulation;Incomplete tongue to palate contact Oral - Mechanical Soft: Weak lingual manipulation Oral Phase - Comment Oral Phase - Comment: thin and nectar bolus loss from left of  mouth without awareness   Pharyngeal Phase Pharyngeal Phase Pharyngeal Phase: Impaired Pharyngeal - Nectar Pharyngeal - Nectar Cup: Premature spillage to pyriform sinuses Pharyngeal - Thin Pharyngeal - Thin Cup: Premature spillage to pyriform  sinuses;Penetration/Aspiration during swallow;Reduced  airway/laryngeal closure;Trace aspiration  Penetration/Aspiration details (thin cup): Material enters  airway, passes BELOW cords without attempt by patient to eject  out (silent aspiration) Pharyngeal - Solids Pharyngeal - Puree: Premature spillage to valleculae Pharyngeal - Regular: Premature spillage to valleculae  Cervical Esophageal Phase    Amanda L. Samson Frederic, [place] CCC/SLP Pager [phone]              Blenda Mounts Laurice 10/16/2014, 2:54 PM      PERTINENT LAB RESULTS: CBC: No results for input(s): WBC, HGB, HCT, PLT in the last 72 hours. CMET CMP     Component Value Date/Time   NA 139 10/18/2014 0410   K 4.1 10/18/2014 0410   CL 101 10/18/2014 0410   CO2 23 10/18/2014 0410   GLUCOSE 102* 10/18/2014 0410   BUN 17 10/18/2014 0410   CREATININE 0.95 10/18/2014 0410   CALCIUM 9.8 10/18/2014 0410   PROT 8.5* 10/15/2014 0044   ALBUMIN 3.6 10/15/2014 0044   AST 24 10/15/2014 0044   ALT 13 10/15/2014 0044   ALKPHOS 58 10/15/2014 0044   BILITOT 0.3 10/15/2014 0044   GFRNONAA >90 10/18/2014 0410   GFRAA >90 10/18/2014 0410    GFR Estimated Creatinine Clearance: 85 mL/min (by C-G formula based on Cr of 0.95). No results for input(s): LIPASE, AMYLASE in the last 72 hours. No results for input(s): CKTOTAL, CKMB, CKMBINDEX, TROPONINI in the last 72 hours. Invalid input(s): POCBNP No results for input(s): DDIMER in the last 72 hours. No results for input(s): HGBA1C in the last 72 hours. No results for input(s): CHOL, HDL, LDLCALC, TRIG, CHOLHDL, LDLDIRECT in the last 72 hours. No results for input(s): TSH, T4TOTAL, T3FREE, THYROIDAB in the last 72 hours.  Invalid input(s):  FREET3 No results for input(s): VITAMINB12, FOLATE, FERRITIN, TIBC, IRON, RETICCTPCT in the last 72 hours. Coags:  Recent Labs  10/16/14 1153  INR 1.08   Microbiology: No results found for this or any previous visit (from the past 240 hour(s)).   BRIEF HOSPITAL COURSE:  Acute CVA (cerebrovascular accident):Probable embolic infarct. MRI brain poor  quality given motion artifact, however confirms Large R MCA with mass effect on R lateral ventricle.Transthoracic and trans-esophageal echocardiogram negative for any embolic source.Unable to tolerate a MRA, however carotid Doppler without any significant stenosis although very poor study because of noncooperation. Subsequently underwent a CTA of the neck and head, which showed right MCA occluded at its origin with minimal reconstitution distally.Also underwent loop recorder implantation on 10/18/14 Seen by speech therapy on 11/25, current recommendations are for dysphagia 2 diet with nectar thick liquids, patient educated about risk of aspiration and he is willing to proceed with oral intake. LDL elevated at 118, started statins. A1c 6.1. Continue aspirin.Unfortunately, continues to have left hemiplegia, left facial droop-being transferred to  [facility] on discharge  Active Problems:  Alcohol abuse: Patient claims he drinks a fair amount of alcohol-beer, liquor and block,. Last drink on 11/23. Currently awake, alert and without any tremors. Managed with Ativan per CIWA protocol.   Mild hyperkalemia: Unknown etiology, resolved with Kayexalate suppository  TODAY-DAY OF DISCHARGE:  Subjective:   Karma Hiney today has no headache,no chest abdominal pain,no new weakness tingling or numbness. Still with left hemiplegia   Objective:   Blood pressure 149/99, pulse 95, temperature 98.1 F (36.7 C), temperature source Oral, resp. rate 20, height 5\' 8"  (1.727 m), weight 79.289 kg (174 lb 12.8 oz), SpO2 100 %. No intake or output data in the 24 hours ending 10/18/14 1335 Filed Weights   10/15/14 0035 10/15/14 0226  Weight: 81.647 kg (180 lb) 79.289 kg (174 lb 12.8 oz)    Exam Awake Alert, Oriented *3. Left sided hemiplegia [place].AT,PERRAL Supple Neck,No JVD, No cervical lymphadenopathy appriciated.  Symmetrical Chest wall movement, Good air movement bilaterally, CTAB RRR,No Gallops,Rubs or new  Murmurs, No Parasternal Heave +ve B.Sounds, Abd Soft, Non tender, No organomegaly appriciated, No rebound -guarding or rigidity. No Cyanosis, Clubbing or edema, No new Rash or bruise  DISCHARGE CONDITION: Stable  DISPOSITION: CIR  DISCHARGE INSTRUCTIONS:    Activity:  As tolerated with Full fall precautions use walker/cane & assistance as needed  Diet recommendation: Heart Healthy diet  Follow-up Information    Follow up with Sherryl Manges, MD.   Specialty:  Cardiology   Why:  his office will call you for recorder site check    Contact information:   [address] [place] [address] [phone]       Follow up with SETHI,PRAMOD, MD. Schedule an appointment as soon as possible for a visit in 1 month.   Specialties:  Neurology, Radiology   Contact information:   [address] [place] [address] [phone]      Total Time spent on discharge equals 45 minutes.  SignedJeoffrey Massed 10/18/2014 1:35 PM

## 2014-10-18 NOTE — H&P (View-Only) (Signed)
  Physical Medicine and Rehabilitation Admission H&P    CC: Left sided weakness, slurred speech, left facial weakness  HPI:   Cory [name] is a 53 y.o. RH- male with history of HTN, Hyperlipidemia, heavy alcohol use, noncompliance with meds; who was admitted on 10/15/14 with left facial droop, slurred speech, left sided weakness and somnolence. CT head with ischemic changes in R-MCA territory affecting frontal cortex, anterior coronal radiata and dorsal striatum. 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 Carotid dopplers limited due to poor cooperation--R-ICA with 1-30% ICA stenosis. TEE done revealing EF 55-65% with no PFO and increased RA pressure. Hypercoagulopathy panel pending. MBS done revealing moderate oral and mild pharyngeal dysphagia and patient placed on dysphagia 2, nectar liquids. Neurology recommending ASA for secondary stroke prevention. Therapy evaluations done 11/25 and patient limited by dense L- hemiplegia, poor truncal control with impaired poor safety awareness. Plans for loop recorder today? CIR recommended by rehab team and MD for follow up therapy.    Review of Systems  HENT: Negative for hearing loss.   Eyes: Negative for blurred vision and double vision.  Respiratory: Negative for cough and shortness of breath.   Cardiovascular: Negative for chest pain and palpitations.  Gastrointestinal: Negative for heartburn, nausea and abdominal pain.  Musculoskeletal: Negative for myalgias.  Neurological: Positive for sensory change, speech change and focal weakness. Negative for dizziness and headaches.     Past Medical History  Diagnosis Date  . Medical history non-contributory     Past Surgical History  Procedure Laterality Date  . No past surgeries      Family History  Problem Relation Age of Onset  . Stroke Mother   . Stroke Sister    Social  History:   Lives with girlfriend (question dependability). Moved from [place] 2 years ago and does not have a local physician.   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  He  reports that he has quit smoking--but dips 1 1/2 can snuff daily.   He reports that he drinks alcohol--6 pack beer and a shot of liquor daily.  He reports that he does not use illicit drugs.    Allergies: No Known Allergies    Medications Prior to Admission  Medication Sig Dispense Refill  . HYDROcodone-acetaminophen (NORCO/VICODIN) 5-325 MG per tablet Take 1-2 tablets by mouth every 4 (four) hours as needed. 12 tablet 0  . ibuprofen (ADVIL,MOTRIN) 200 MG tablet Take 200 mg by mouth every 6 (six) hours as needed for mild pain.      Home: Home Living Family/patient expects to be discharged to:: Private residence Living Arrangements: Spouse/significant other Type of Home: House Home Access: Stairs to enter Entrance Stairs-Number of Steps: 5 Entrance Stairs-Rails: None Home Layout: One level Home Equipment: Crutches Additional Comments: Still driving   Functional History: Prior Function Level of Independence: Independent  Functional Status:  Mobility: Bed Mobility Overal bed mobility: Needs Assistance Bed Mobility: Supine to Sit Supine to sit: Min assist General bed mobility comments: Heavy use of rails and VC's for technique. Multiple attempts to get to EOB. Increased effort. Transfers Overall transfer level: Needs assistance Equipment used: 2 person hand held assist Transfers: Sit to/from Stand, Stand Pivot Transfers Sit to Stand: Mod assist, +2 physical assistance Stand pivot transfers: Mod assist, +2 physical assistance General transfer comment: Mod +2 (A) for sit<>stand with blocking of L   knee. Pt required assist for boost to stand and to maintain balance. Verbal cues to pick head up and stand up tall. Mod +2 (A) for stand-pivot to recliner with  blocking of L knee and manual (A) to move L foot back. Pt attempted to hop on RLE with knee buckling noted.  Ambulation/Gait General Gait Details: Deferred.    ADL: ADL Overall ADL's : Needs assistance/impaired Grooming: Wash/dry face, Supervision/safety, Sitting Grooming Details (indicate cue type and reason): Pt able to wipe drool from mouth. Lower Body Dressing: Total assistance, Sitting/lateral leans Lower Body Dressing Details (indicate cue type and reason): Total (A) to don socks. Pt unable to bring R foot across knee without severe L lateral lean and complete LOB while seated EOB. Functional mobility during ADLs: Moderate assistance, +2 for physical assistance General ADL Comments: Pt with significant L lateral lean during static sitting. Pt already demonstrating compensatory strategies by using RUE to move LUE. Mod +2 (A) for functional mobility due to pt uanble to initiate movement with LLE during transfers in today's sessions  Cognition: Cognition Overall Cognitive Status: Impaired/Different from baseline Arousal/Alertness: Awake/alert Orientation Level: Oriented X4 Attention: Sustained Sustained Attention: Impaired Sustained Attention Impairment: Functional basic (pt with decreased attention to remove phone from ear when finished talking) Memory: Impaired Memory Impairment: Storage deficit, Retrieval deficit, Decreased recall of new information, Decreased short term memory, Prospective memory Decreased Short Term Memory: Verbal complex Awareness: Impaired Awareness Impairment: Intellectual impairment (to left sided weakness and facial deficits) Problem Solving: Impaired (did not call for assist to use call bell for assistance) Safety/Judgment: Impaired Cognition Arousal/Alertness: Awake/alert Behavior During Therapy: Flat affect Overall Cognitive Status: Impaired/Different from baseline Area of Impairment: Attention, Following commands, Safety/judgement, Awareness,  Problem solving Current Attention Level: Sustained Memory: Decreased short-term memory Following Commands: Follows one step commands inconsistently, Follows one step commands with increased time Safety/Judgement: Decreased awareness of safety, Decreased awareness of deficits Awareness: Emergent Problem Solving: Difficulty sequencing, Requires verbal cues General Comments: Pt reporting, "if I had my car, I would walk down to the parking lot and drive to get myself someone to eat," after requiring 2 person assist to get to chair.  Physical Exam: Blood pressure 149/99, pulse 95, temperature 98.1 F (36.7 C), temperature source Oral, resp. rate 20, height _0  (1.727 m), weight 79.289 kg (174 lb 12.8 oz), SpO2 100 %. Physical Exam  Nursing note and vitals reviewed. Constitutional: He is oriented to person, place, and time. He appears well-developed and well-nourished.  HENT:  Head: Normocephalic and atraumatic.  Eyes: Conjunctivae are normal. Pupils are equal, round, and reactive to light.  Left eye lid with crusted dry drainage  Neck: Normal range of motion. Neck supple.  Cardiovascular: Normal rate and regular rhythm.   Respiratory: Effort normal and breath sounds normal. No respiratory distress. He has no wheezes.  GI: Soft. Bowel sounds are normal.  Musculoskeletal: He exhibits no edema or tenderness.  Neurological: He is alert and oriented to person, place, and time.  Left facial weakness with mild dysarthria and left inattention. Poor postural awareness--slumped to the right but able to correct with cues. Has poor insight and awareness of deficits and easily distracted and impulsive. Motor strength 0/5 in left deltoid, biceps, triceps, grip, hip flexor, trace knee extensor, 0 ankle dorsiflexion plantar flexion 5/5 on the right side in the same muscle groups.  Absent sensation in the left upper and left lower extremity  Visual fields absent to confrontation testing on left side  Skin:  Skin is warm and dry.  Psychiatric: His affect is blunt. His speech is slurred. He is withdrawn. He expresses impulsivity.    Results for orders placed or performed during the hospital encounter of 10/15/14 (from the past 48 hour(s))  Basic metabolic panel     Status: Abnormal   Collection Time: 10/16/14 11:53 AM  Result Value Ref Range   Sodium 137 137 - 147 mEq/L   Potassium 4.0 3.7 - 5.3 mEq/L   Chloride 100 96 - 112 mEq/L   CO2 24 19 - 32 mEq/L   Glucose, Bld 89 70 - 99 mg/dL   BUN 16 6 - 23 mg/dL   Creatinine, Ser 1.10 0.50 - 1.35 mg/dL   Calcium 9.2 8.4 - 10.5 mg/dL   GFR calc non Af Amer 74 (L) >90 mL/min   GFR calc Af Amer 86 (L) >90 mL/min    Comment: (NOTE) The eGFR has been calculated using the CKD EPI equation. This calculation has not been validated in all clinical situations. eGFR's persistently <90 mL/min signify possible Chronic Kidney Disease.    Anion gap 13 5 - 15  Protime-INR     Status: None   Collection Time: 10/16/14 11:53 AM  Result Value Ref Range   Prothrombin Time 14.1 11.6 - 15.2 seconds   INR 1.08 0.00 - 1.49  Basic metabolic panel     Status: Abnormal   Collection Time: 10/18/14  4:10 AM  Result Value Ref Range   Sodium 139 137 - 147 mEq/L   Potassium 4.1 3.7 - 5.3 mEq/L   Chloride 101 96 - 112 mEq/L   CO2 23 19 - 32 mEq/L   Glucose, Bld 102 (H) 70 - 99 mg/dL   BUN 17 6 - 23 mg/dL   Creatinine, Ser 0.95 0.50 - 1.35 mg/dL   Calcium 9.8 8.4 - 10.5 mg/dL   GFR calc non Af Amer >90 >90 mL/min   GFR calc Af Amer >90 >90 mL/min    Comment: (NOTE) The eGFR has been calculated using the CKD EPI equation. This calculation has not been validated in all clinical situations. eGFR's persistently <90 mL/min signify possible Chronic Kidney Disease.    Anion gap 15 5 - 15   Ct Angio Head W/cm &/or Wo Cm  10/16/2014   ADDENDUM REPORT: 10/16/2014 18:10  ADDENDUM: Study discussed by telephone with stroke provider SHARON BIBY on 10/16/2014 at 1802 hrs.    Electronically Signed   By: Lee  Hall M.D.   On: 10/16/2014 18:10   10/16/2014   CLINICAL DATA:  53-year-old male with left side weakness status post right MCA infarct diagnosed on recent MRI. Initial encounter.  EXAM: CT ANGIOGRAPHY HEAD AND NECK  TECHNIQUE: Multidetector CT imaging of the head and neck was performed using the standard protocol during bolus administration of intravenous contrast. Multiplanar CT image reconstructions and MIPs were obtained to evaluate the vascular anatomy. Carotid stenosis measurements (when applicable) are obtained utilizing NASCET criteria, using the distal internal carotid diameter as the denominator.  CONTRAST:  80mL OMNIPAQUE IOHEXOL 350 MG/ML SOLN  COMPARISON:  Limited brain MRI 10/15/2014. Head CT without contrast 10/15/2014.  FINDINGS: CTA HEAD FINDINGS  Progression of cytotoxic edema in the right MCA territory, patchy in discontinuous in some places. Right caudate nucleus involvement. No associated petechial hemorrhage. Associated mass effect with partial effacement of the right lateral ventricle. Mild leftward midline shift at 2 mm is new. Left hemisphere and posterior fossa gray-white matter differentiation is stable. Patchy mostly gyriform   enhancement in the right MCA territory following contrast administration.  Stable visualized osseous structures. Visualized paranasal sinuses and mastoids are clear. Visualized orbits and scalp soft tissues are within normal limits.  VASCULAR FINDINGS:  Major intracranial venous structures are enhancing.  Mildly dominant distal left vertebral artery. Normal PICA origins. Patent vertebrobasilar junction. No basilar artery stenosis. SCA origins are normal. Fetal type PCA origins, more so the right. Bilateral PCA branches are within normal limits.  Normal left ICA siphon except for dolichoectasia. Left ophthalmic and posterior communicating artery origins are within normal limits. Patent right ICA siphon with dolichoectasia but no  stenosis. Ophthalmic and right posterior communicating artery origins are within normal limits. Tortuous posterior communicating arteries, more so the right. Patent carotid termini.  Occluded right MCA at its origin. Hyperdense right MCA now evident on noncontrast images. Minimal reconstituted flow in right MCA branches.  ACA and left MCA origins are normal. Dominant left A1 segment. Anterior communicating artery within normal limits. Tortuous proximal ACAs. Visualized bilateral ACA branches otherwise within normal limits. Left MCA branches within normal limits.  Review of the MIP images confirms the above findings.  CTA NECK FINDINGS  Negative lung apices. No superior mediastinal lymphadenopathy. Thyroid, larynx, pharynx, parapharyngeal spaces, retropharyngeal space, sublingual space, submandibular glands, and parotid glands are within normal limits. No cervical lymphadenopathy. No acute osseous abnormality identified.  VASCULAR FINDINGS: Bovine arch configuration. No arch atherosclerosis or great vessel origin stenosis.  No right CCA origin stenosis. Normal right carotid bifurcation. Tortuous but otherwise negative cervical right ICA.  No proximal right subclavian artery stenosis. Normal right vertebral artery origin. Mildly non dominant right vertebral artery is normal to the skullbase.  No left CCA origin stenosis. Tortuous proximal left CCA. Normal left carotid bifurcation. Tortuous but otherwise negative cervical left ICA.  No proximal left subclavian artery stenosis. Normal left vertebral artery origin. Intermittently tortuous but otherwise normal left vertebral artery to the skullbase.  Review of the MIP images confirms the above findings.  IMPRESSION: 1. 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 2. Intracranial CTA otherwise negative except for dolichoectasia. 3. Negative neck CTA except for tortuous ICAs. No right carotid plaque or stenosis identified. 4. Expected  evolution of overall moderate size right MCA infarct. Mass effect with mild leftward midline shift (2 mm). No hemorrhagic transformation.  Electronically Signed: By: Lee  Hall M.D. On: 10/16/2014 17:57      Medical Problem List and Plan: 1. Functional deficits secondary to R-MCA infarct question embolic v/s thrombotic with resultant left hemiplegia with sensory deficits, left facial weakness, left neglect and dysphagia. 2.  DVT Prophylaxis/Anticoagulation: Pharmaceutical: Lovenox 3. Pain Management: N/A 4. Mood:  Seems withdrawn--will monitor for now.  LCSW to follow for support and evaluation.  5. Neuropsych: This patient is capable of making decisions on his own behalf. 6. Skin/Wound Care: Routine pressure relief measures.  7. Fluids/Electrolytes/Nutrition:  Monitor I/O. Follow up labs in am.  8. HTN: 9. H/o Alcohol abuse/Tobacco use: Will add nicotine patch as asking for his snuff. CIWA protocol. Continue folic acid and thiamine.  10. Dyslipidemia:    Post Admission Physician Evaluation: 1. Functional deficits secondary  to  R-MCA infarct question embolic v/s thrombotic with resultant left hemiplegia with sensory deficits, left facial weakness, left neglect and dysphagia. 1.  2. Patient is admitted to receive collaborative, interdisciplinary care between the physiatrist, rehab nursing staff, and therapy team. 3. Patient's level of medical complexity and substantial therapy needs in context of that   medical necessity cannot be provided at a lesser intensity of care such as a SNF. 4. Patient has experienced substantial functional loss from his/her baseline which was documented above under the "Functional History" and "Functional Status" headings.  Judging by the patient's diagnosis, physical exam, and functional history, the patient has potential for functional progress which will result in measurable gains while on inpatient rehab.  These gains will be of substantial and practical use upon  discharge  in facilitating mobility and self-care at the household level. 5. Physiatrist will provide 24 hour management of medical needs as well as oversight of the therapy plan/treatment and provide guidance as appropriate regarding the interaction of the two. 6. 24 hour rehab nursing will assist with bladder management, bowel management, safety, skin/wound care, disease management, medication administration, pain management and patient education  and help integrate therapy concepts, techniques,education, etc. 7. PT will assess and treat for/with: Lower extremity strength, range of motion, stamina, balance, functional mobility, safety, adaptive techniques and equipment, NMR, visual perceptual awareness, ego support, stroke education, pain mgt.   Goals are: supervision to min assist. 8. OT will assess and treat for/with: ADL's, functional mobility, safety, upper extremity strength, adaptive techniques and equipment, NMR, visual perceptual awareness, stroke education, community reintegration.   Goals are: supervision to min assist. Therapy may proceed with showering this patient. 9. SLP will assess and treat for/with: communication, speech, swallowing, cognition.  Goals are: supervision to min assist. 10. Case Management and Social Worker will [employer] will be held weekly to assess progress toward goals and to determine barriers to discharge. 11. Patient will receive at least 3 hours of therapy per day at least 5 days per week. 12. ELOS: 18-23 days       13. Prognosis:  good     Meredith Staggers, MD, [facility] 10/18/2014   10/18/2014

## 2014-10-18 NOTE — Plan of Care (Signed)
Problem: SLP Cognition Goals Goal: Patient will demonstrate attention to functional Patient will demonstrate attention to functional task with Focused attention to left side

## 2014-10-18 NOTE — Progress Notes (Signed)
Rehab admissions - Evaluated for possible admission.  I met with daughter, sister and 2 male family members at the bedside.  I explained inpatient rehab and gave them rehab booklets.  They are interested in inpatient rehab here in the hospital.  They are asking for perhaps placement in [place] where daughter lives if possible after rehab stay.  Bed available and will admit to acute inpatient rehab today.  Call me for questions.  #[phone]

## 2014-10-18 NOTE — Plan of Care (Signed)
Problem: Acute Treatment Outcomes Goal: Neuro exam at baseline or improved Outcome: Completed/Met Date Met:  10/18/14

## 2014-10-18 NOTE — Progress Notes (Signed)
PATIENT DETAILS Name: Cory SleightStephen [name] Age: 53 y.o. Sex: [name] Date of Birth: January 13, 1961 Admit Date: 10/15/2014 Admitting Physician Eduard ClosArshad N Kakrakandy, MD PCP:No PCP Per Patient  Subjective: Essentially unchanged. No complaints this am  Assessment/Plan: Principal Problem:   Acute CVA (cerebrovascular accident):Probable embolic infarct. MRI brain poor quality given motion artifact, however confirms Large R MCA with mass effect on R lateral ventricle.Transthoracic and trans-esophageal echocardiogram negative for any embolic source.Unable to tolerate a MRA, however carotid Doppler without any significant stenosis although very poor study because of noncooperation. Subsequently underwent a CTA of the neck and head, which showed right MCA occluded at its origin with minimal reconstitution distally. Seen by speech therapy on 11/25, current recommendations are for dysphagia 2 diet with nectar thick liquids, patient educated about risk of aspiration and he is willing to proceed with oral intake. LDL elevated at 118, started statins. A1c 6.1. Continue aspirin .Unfortunately, continues to have left hemiplegia, left facial droop-await CIR evaluation.  Active Problems:   Alcohol abuse: Patient claims he drinks a fair amount of alcohol-beer, liquor and block,. Last drink on 11/23. Currently awake, alert and without any tremors. Continue Ativan per CIWA protocol.Suspect we could discontinue CIWA protocol in the next few days    Mild hyperkalemia: Unknown etiology, resolved with Kayexalate suppository  Disposition: Remain inpatient-await CIR evaluation  Antibiotics:  Anti-infectives    None      DVT Prophylaxis: Prophylactic Lovenox   Code Status: Full code   Family Communication None at bedside  Procedures:  None  CONSULTS:  neurology  MEDICATIONS: Scheduled Meds: . antiseptic oral rinse  7 mL Mouth Rinse q12n4p  . aspirin  300 mg Rectal Daily   Or  . aspirin  325 mg  Oral Daily  . atorvastatin  Cory mg Oral q1800  . chlorhexidine  15 mL Mouth Rinse BID  . enoxaparin (LOVENOX) injection  40 mg Subcutaneous Q24H  . folic acid  1 mg Oral Daily  . LORazepam  0-4 mg Intravenous Q12H  . multivitamin with minerals  1 tablet Oral Daily  . thiamine  100 mg Oral Daily   Continuous Infusions:   PRN Meds:.acetaminophen, senna-docusate    PHYSICAL EXAM: Vital signs in last 24 hours: Filed Vitals:   10/17/14 1351 10/17/14 1700 10/18/14 0131 10/18/14 0554  BP: 136/90 127/83 134/90 142/99  Pulse: 74 84 97 83  Temp: 98.2 F (36.8 C) 98.4 F (36.9 C) 98.1 F (36.7 C) 98.8 F (37.1 C)  TempSrc: Oral Oral Oral Oral  Resp: Cory 17 18 18   Height:      Weight:      SpO2: 100% 99% 99% 100%    Weight change:  Filed Weights   10/15/14 0035 10/15/14 0226  Weight: 81.647 kg (180 lb) 79.289 kg (174 lb 12.8 oz)   Body mass index is 26.58 kg/(m^2).   Gen Exam: Awake and alert with clear but slow speech.   Neck: Supple, No JVD.   Chest: B/L Clear.  No rales or rhonchi CVS: S1 S2 Regular, no murmurs.  Abdomen: soft, BS +, non tender, non distended.  Extremities: no edema, lower extremities warm to touch. Neurologic: Left sided hemiplegia-unchanged Skin: No Rash.   Wounds: N/A.   Intake/Output from previous day:  Intake/Output Summary (Last 24 hours) at 10/18/14 0907 Last data filed at 10/17/14 0914  Gross per 24 hour  Intake    120 ml  Output      0 ml  Net    120 ml     LAB RESULTS: CBC  Recent Labs Lab 10/15/14 0044 10/15/14 0050  WBC 5.1  --   HGB 15.6 17.7*  HCT 47.3 52.0  PLT 220  --   MCV 86.5  --   MCH 28.5  --   MCHC 33.0  --   RDW 12.4  --   LYMPHSABS 2.6  --   MONOABS 0.4  --   EOSABS 0.2  --   BASOSABS 0.0  --     Chemistries   Recent Labs Lab 10/15/14 0044 10/15/14 0050 10/16/14 0656 10/16/14 1153 10/18/14 0410  NA 141 141 140 137 139  K 5.6* 5.5* 4.3 4.0 4.1  CL 101 104 103 100 101  CO2 26  --  23 24 23     GLUCOSE 113* 118* 89 89 102*  BUN 8 10 15 16 17   CREATININE 0.93 1.10 1.00 1.10 0.95  CALCIUM 9.1  --  9.1 9.2 9.8  MG  --   --  2.0  --   --     CBG:  Recent Labs Lab 10/15/14 0043 10/15/14 1624  GLUCAP 97 97    GFR Estimated Creatinine Clearance: 85 mL/min (by C-G formula based on Cr of 0.95).  Coagulation profile  Recent Labs Lab 10/15/14 0044 10/16/14 1153  INR 1.03 1.08    Cardiac Enzymes No results for input(s): CKMB, TROPONINI, MYOGLOBIN in the last 168 hours.  Invalid input(s): CK  Invalid input(s): POCBNP No results for input(s): DDIMER in the last 72 hours. No results for input(s): HGBA1C in the last 72 hours. No results for input(s): CHOL, HDL, LDLCALC, TRIG, CHOLHDL, LDLDIRECT in the last 72 hours. No results for input(s): TSH, T4TOTAL, T3FREE, THYROIDAB in the last 72 hours.  Invalid input(s): FREET3 No results for input(s): VITAMINB12, FOLATE, FERRITIN, TIBC, IRON, RETICCTPCT in the last 72 hours. No results for input(s): LIPASE, AMYLASE in the last 72 hours.  Urine Studies No results for input(s): UHGB, CRYS in the last 72 hours.  Invalid input(s): UACOL, UAPR, USPG, UPH, UTP, UGL, UKET, UBIL, UNIT, UROB, ULEU, UEPI, UWBC, URBC, UBAC, CAST, UCOM, BILUA  MICROBIOLOGY: No results found for this or any previous visit (from the past 240 hour(s)).  RADIOLOGY STUDIES/RESULTS: Ct Angio Head W/cm &/or Wo Cm  10/16/2014   ADDENDUM REPORT: 10/16/2014 18:10  ADDENDUM: Study discussed by telephone with stroke provider SHARON BIBY on 10/16/2014 at 1802 hrs.   Electronically Signed   By: Augusto Gamble M.D.   On: 10/16/2014 18:10   10/16/2014   CLINICAL DATA:  53 year old [name] with left side weakness status post right MCA infarct diagnosed on recent MRI. Initial encounter.  EXAM: CT ANGIOGRAPHY HEAD AND NECK  TECHNIQUE: Multidetector CT imaging of the head and neck was performed using the standard protocol during bolus administration of intravenous contrast.  Multiplanar CT image reconstructions and MIPs were obtained to evaluate the vascular anatomy. Carotid stenosis measurements (when applicable) are obtained utilizing NASCET criteria, using the distal internal carotid diameter as the denominator.  CONTRAST:  80mL OMNIPAQUE IOHEXOL 350 MG/ML SOLN  COMPARISON:  Limited brain MRI 10/15/2014. Head CT without contrast 10/15/2014.  FINDINGS: CTA HEAD FINDINGS  Progression of cytotoxic edema in the right MCA territory, patchy in discontinuous in some places. Right caudate nucleus involvement. No associated petechial hemorrhage. Associated mass effect with partial effacement of the right lateral ventricle. Mild leftward midline shift at 2 mm is new. Left hemisphere and posterior fossa gray-white  matter differentiation is stable. Patchy mostly gyriform enhancement in the right MCA territory following contrast administration.  Stable visualized osseous structures. Visualized paranasal sinuses and mastoids are clear. Visualized orbits and scalp soft tissues are within normal limits.  VASCULAR FINDINGS:  Major intracranial venous structures are enhancing.  Mildly dominant distal left vertebral artery. Normal PICA origins. Patent vertebrobasilar junction. No basilar artery stenosis. SCA origins are normal. Fetal type PCA origins, more so the right. Bilateral PCA branches are within normal limits.  Normal left ICA siphon except for dolichoectasia. Left ophthalmic and posterior communicating artery origins are within normal limits. Patent right ICA siphon with dolichoectasia but no stenosis. Ophthalmic and right posterior communicating artery origins are within normal limits. Tortuous posterior communicating arteries, more so the right. Patent carotid termini.  Occluded right MCA at its origin. Hyperdense right MCA now evident on noncontrast images. Minimal reconstituted flow in right MCA branches.  ACA and left MCA origins are normal. Dominant left A1 segment. Anterior  communicating artery within normal limits. Tortuous proximal ACAs. Visualized bilateral ACA branches otherwise within normal limits. Left MCA branches within normal limits.  Review of the MIP images confirms the above findings.  CTA NECK FINDINGS  Negative lung apices. No superior mediastinal lymphadenopathy. Thyroid, larynx, pharynx, parapharyngeal spaces, retropharyngeal space, sublingual space, submandibular glands, and parotid glands are within normal limits. No cervical lymphadenopathy. No acute osseous abnormality identified.  VASCULAR FINDINGS: Bovine arch configuration. No arch atherosclerosis or great vessel origin stenosis.  No right CCA origin stenosis. Normal right carotid bifurcation. Tortuous but otherwise negative cervical right ICA.  No proximal right subclavian artery stenosis. Normal right vertebral artery origin. Mildly non dominant right vertebral artery is normal to the skullbase.  No left CCA origin stenosis. Tortuous proximal left CCA. Normal left carotid bifurcation. Tortuous but otherwise negative cervical left ICA.  No proximal left subclavian artery stenosis. Normal left vertebral artery origin. Intermittently tortuous but otherwise normal left vertebral artery to the skullbase.  Review of the MIP images confirms the above findings.  IMPRESSION: 1. 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 2. Intracranial CTA otherwise negative except for dolichoectasia. 3. Negative neck CTA except for tortuous ICAs. No right carotid plaque or stenosis identified. 4. Expected evolution of overall moderate size right MCA infarct. Mass effect with mild leftward midline shift (2 mm). No hemorrhagic transformation.  Electronically Signed: By: Augusto GambleLee  Hall M.D. On: 10/16/2014 17:57   Ct Head Wo Contrast  10/15/2014   CLINICAL DATA:  Left-sided weakness.  Code stroke.  EXAM: CT HEAD WITHOUT CONTRAST  TECHNIQUE: Contiguous axial images were obtained from the base of the skull  through the vertex without intravenous contrast.  COMPARISON:  None.  FINDINGS: Skull and Sinuses:Negative for fracture or destructive process. The mastoids, middle ears, and imaged paranasal sinuses are clear.  Orbits: No acute abnormality.  Brain: Small area of low-density within the high and posterior right frontal cortex and subcortical white matter. In the same region, there is patchy low-density within the deep cerebral white matter. Focal low-density expansion of the right caudate nucleus (anterior body); the upper right putamen is difficult to visualize. Gray-white differentiation is somewhat blurred in the right lateral temporal lobe, but this finding is indeterminate due to image noise. No hemorrhage, hydrocephalus, or shift.  Critical Value/emergent results were called by telephone at the time of interpretation on 10/15/2014 at 1:15 am to Dr. Roseanne RenoStewart, who verbally acknowledged these results.  IMPRESSION: 1. Ischemic changes in the right  MCA territory affecting the frontal cortex, anterior corona radiata, and dorsal striatum. 2. No acute intracranial hemorrhage.   Electronically Signed   By: Tiburcio Pea M.D.   On: 10/15/2014 01:22   Ct Angio Neck W/cm &/or Wo/cm  10/16/2014   ADDENDUM REPORT: 10/16/2014 18:10  ADDENDUM: Study discussed by telephone with stroke provider SHARON BIBY on 10/16/2014 at 1802 hrs.   Electronically Signed   By: Augusto Gamble M.D.   On: 10/16/2014 18:10   10/16/2014   CLINICAL DATA:  53 year old [name] with left side weakness status post right MCA infarct diagnosed on recent MRI. Initial encounter.  EXAM: CT ANGIOGRAPHY HEAD AND NECK  TECHNIQUE: Multidetector CT imaging of the head and neck was performed using the standard protocol during bolus administration of intravenous contrast. Multiplanar CT image reconstructions and MIPs were obtained to evaluate the vascular anatomy. Carotid stenosis measurements (when applicable) are obtained utilizing NASCET criteria, using the  distal internal carotid diameter as the denominator.  CONTRAST:  80mL OMNIPAQUE IOHEXOL 350 MG/ML SOLN  COMPARISON:  Limited brain MRI 10/15/2014. Head CT without contrast 10/15/2014.  FINDINGS: CTA HEAD FINDINGS  Progression of cytotoxic edema in the right MCA territory, patchy in discontinuous in some places. Right caudate nucleus involvement. No associated petechial hemorrhage. Associated mass effect with partial effacement of the right lateral ventricle. Mild leftward midline shift at 2 mm is new. Left hemisphere and posterior fossa gray-white matter differentiation is stable. Patchy mostly gyriform enhancement in the right MCA territory following contrast administration.  Stable visualized osseous structures. Visualized paranasal sinuses and mastoids are clear. Visualized orbits and scalp soft tissues are within normal limits.  VASCULAR FINDINGS:  Major intracranial venous structures are enhancing.  Mildly dominant distal left vertebral artery. Normal PICA origins. Patent vertebrobasilar junction. No basilar artery stenosis. SCA origins are normal. Fetal type PCA origins, more so the right. Bilateral PCA branches are within normal limits.  Normal left ICA siphon except for dolichoectasia. Left ophthalmic and posterior communicating artery origins are within normal limits. Patent right ICA siphon with dolichoectasia but no stenosis. Ophthalmic and right posterior communicating artery origins are within normal limits. Tortuous posterior communicating arteries, more so the right. Patent carotid termini.  Occluded right MCA at its origin. Hyperdense right MCA now evident on noncontrast images. Minimal reconstituted flow in right MCA branches.  ACA and left MCA origins are normal. Dominant left A1 segment. Anterior communicating artery within normal limits. Tortuous proximal ACAs. Visualized bilateral ACA branches otherwise within normal limits. Left MCA branches within normal limits.  Review of the MIP images  confirms the above findings.  CTA NECK FINDINGS  Negative lung apices. No superior mediastinal lymphadenopathy. Thyroid, larynx, pharynx, parapharyngeal spaces, retropharyngeal space, sublingual space, submandibular glands, and parotid glands are within normal limits. No cervical lymphadenopathy. No acute osseous abnormality identified.  VASCULAR FINDINGS: Bovine arch configuration. No arch atherosclerosis or great vessel origin stenosis.  No right CCA origin stenosis. Normal right carotid bifurcation. Tortuous but otherwise negative cervical right ICA.  No proximal right subclavian artery stenosis. Normal right vertebral artery origin. Mildly non dominant right vertebral artery is normal to the skullbase.  No left CCA origin stenosis. Tortuous proximal left CCA. Normal left carotid bifurcation. Tortuous but otherwise negative cervical left ICA.  No proximal left subclavian artery stenosis. Normal left vertebral artery origin. Intermittently tortuous but otherwise normal left vertebral artery to the skullbase.  Review of the MIP images confirms the above findings.  IMPRESSION: 1. Right MCA occluded at its  origin, with minimal reconstituted flow distally. 2. Intracranial CTA otherwise negative except for dolichoectasia. 3. Negative neck CTA except for tortuous ICAs. No right carotid plaque or stenosis identified. 4. Expected evolution of overall moderate size right MCA infarct. Mass effect with mild leftward midline shift (2 mm). No hemorrhagic transformation.  Electronically Signed: By: Augusto Gamble M.D. On: 10/16/2014 17:57   Mr Brain Wo Contrast  10/15/2014   CLINICAL DATA:  53 year old [name] with left facial droop and left hemiplegia. History of alcoholism. Initial encounter.  EXAM: MRI HEAD WITHOUT CONTRAST  TECHNIQUE: Multiplanar, multiecho pulse sequences of the brain and surrounding structures were obtained without intravenous contrast.  COMPARISON:  10/15/2014 head CT.  No comparison brain MR.  FINDINGS:  Examination limited with a total of four sequences. Patient was not able to complete the exam.  Large right middle cerebral artery acute infarct involving portions of the right temporal lobe, right frontal lobe, right parietal lobe, right basal ganglia and corona radiata. Mild local mass effect upon the right lateral ventricle without midline shift.  No obvious intracranial hemorrhage.  No intracranial mass lesion seen on this limited exam  Limited evaluation of the upper cervical spine and intracranial vasculature. Cerebellar tonsils may be minimally low lying but within the range of normal limits.  IMPRESSION: Motion degraded limited exam with evidence of large right hemispheric acute infarct with mild local mass effect upon the right lateral ventricle as noted above.   Electronically Signed   By: Bridgett Larsson M.D.   On: 10/15/2014 12:46   Dg Chest Port 1 View  10/15/2014   CLINICAL DATA:  Acute right MCA distribution stroke.  EXAM: PORTABLE CHEST - 1 VIEW  COMPARISON:  None.  FINDINGS: Suboptimal inspiration accounts for crowded bronchovascular markings, especially in the bases, and accentuates the cardiac silhouette. Taking this into account, cardiomediastinal silhouette unremarkable. Lungs clear. Bronchovascular markings normal. Pulmonary vascularity normal. No visible pleural effusions. No pneumothorax.  IMPRESSION: Suboptimal inspiration.  No acute cardiopulmonary disease.   Electronically Signed   By: Hulan Saas M.D.   On: 10/15/2014 07:52   Dg Swallowing Func-speech Pathology  10/16/2014   Carolan Shiver, CCC-SLP     10/16/2014  2:55 PM Objective Swallowing Evaluation: Modified Barium Swallowing Study   Patient Details  Name: Cory [name] MRN: [id number] Date of Birth: [birth year]  Today's Date: 10/16/2014 Time: 1250-1314 SLP Time Calculation (min) (ACUTE ONLY): 24 min  Past Medical History:  Past Medical History  Diagnosis Date  . Medical history non-contributory    Past Surgical  History:  Past Surgical History  Procedure Laterality Date  . No past surgeries     HPI:  53 y.o. [name] with history of alcoholism was brought to the ER  with left facial and left-sided hemiplegia. CT head showed  features consistent for CVA on the right MCA territory. CXR  Suboptimal inspiration. No acute cardiopulmonary disease.Marland Kitchen  Poor  performance on bedside swallow eval with recs for MBS.      Assessment / Plan / Recommendation Clinical Impression  Dysphagia Diagnosis: Moderate oral phase dysphagia;Mild  pharyngeal phase dysphagia Clinical impression: Pt presents with a moderate oral, mild  pharyngeal dysphagia marked by significant deficits in sensation  with copious amounts of all materials spilling from left anterior  oral caviity without awareness.  There is also premature loss of  all POs over base of tongue, with thin liquids immediately being  aspirated without awareness.  Pharyngeal clearance of materials  was adequate with no residue  post-swallow.  Pt extremely  impulsive, with difficulty waiting for instructions, following  commands, and lacking insight.  Recommend initiating a dysphagia  2 diet with nectar-thick liquids initially.  Pt will require full  supervision given cognitive deficits; will require cues during  meal to maintain safety.      Treatment Recommendation  Therapy as outlined in treatment plan below    Diet Recommendation Dysphagia 2 (Fine chop);Nectar-thick liquid   Liquid Administration via: Cup Medication Administration: Whole meds with puree Supervision: Patient able to self feed;Full supervision/cueing  for compensatory strategies Compensations: Small sips/bites;Slow rate;Check for  pocketing;Check for anterior loss Postural Changes and/or Swallow Maneuvers: Seated upright 90  degrees    Other  Recommendations Oral Care Recommendations: Oral care BID Other Recommendations: Order thickener from pharmacy   Follow Up Recommendations   (tba)    Frequency and Duration min 3x week  2  weeks       SLP Swallow Goals     General Date of Onset: 10/15/14 HPI: 53 y.o. [name] with history of alcoholism was brought to the  ER with left facial and left-sided hemiplegia. CT head showed  features consistent for CVA on the right MCA territory. CXR  Suboptimal inspiration. No acute cardiopulmonary disease.Marland Kitchen  Poor  performance on bedside swallow eval with recs for MBS.  Type of Study: Modified Barium Swallowing Study Reason for Referral: Objectively evaluate swallowing function Diet Prior to this Study: NPO Temperature Spikes Noted: No Respiratory Status: Room air History of Recent Intubation: No Behavior/Cognition: Alert;Cooperative;Pleasant mood Oral Cavity - Dentition: Poor condition;Missing dentition Oral Motor / Sensory Function: Impaired - see Bedside swallow  eval Self-Feeding Abilities: Able to feed self Patient Positioning: Upright in chair Baseline Vocal Quality: Low vocal intensity;Clear Volitional Cough: Strong Volitional Swallow: Able to elicit Anatomy: Within functional limits Pharyngeal Secretions: Not observed secondary MBS    Reason for Referral Objectively evaluate swallowing function   Oral Phase Oral Preparation/Oral Phase Oral Phase: Impaired Oral - Solids Oral - Puree: Left anterior bolus loss;Weak lingual  manipulation;Incomplete tongue to palate contact Oral - Mechanical Soft: Weak lingual manipulation Oral Phase - Comment Oral Phase - Comment: thin and nectar bolus loss from left of  mouth without awareness   Pharyngeal Phase Pharyngeal Phase Pharyngeal Phase: Impaired Pharyngeal - Nectar Pharyngeal - Nectar Cup: Premature spillage to pyriform sinuses Pharyngeal - Thin Pharyngeal - Thin Cup: Premature spillage to pyriform  sinuses;Penetration/Aspiration during swallow;Reduced  airway/laryngeal closure;Trace aspiration Penetration/Aspiration details (thin cup): Material enters  airway, passes BELOW cords without attempt by patient to eject  out (silent aspiration) Pharyngeal - Solids  Pharyngeal - Puree: Premature spillage to valleculae Pharyngeal - Regular: Premature spillage to valleculae  Cervical Esophageal Phase    Amanda L. Samson Frederic, [place] CCC/SLP Pager [phone]              Blenda Mounts Laurice 10/16/2014, 2:54 PM     Jeoffrey Massed, MD  [facility] Pager:[phone]  If 7PM-7AM, please contact night-coverage [email or web address] Password [email or web address] 10/18/2014, 9:07 AM   LOS: 3 days

## 2014-10-18 NOTE — PMR Pre-admission (Signed)
PMR Admission Coordinator Pre-Admission Assessment  Patient: Cory [name] is an 53 y.o., male MRN: [id number] DOB: [birth year] Height: 5\' 8"  (172.7 cm) Weight: 79.289 kg (174 lb 12.8 oz)              Insurance Information Self pay - no insurance  Medicaid Application Date:        Case Manager:   Disability Application Date:        Case Worker:    Emergency Conservator, museum/gallery Information    Name Relation Home Work Mobile   [employer] Daughter   [phone]   Audree Camel   [phone]   Campbell,Lynette Significant other [phone]       Current Medical History  Patient Admitting Diagnosis:  R MCA infarct  History of Present Illness:  A 53 y.o. RH- male with history of HTN, Hyperlipidemia, heavy alcohol use, noncompliance with meds; who was admitted on 10/15/14 with left facial droop, slurred speech, left sided weakness and somnolence. CT head with ischemic changes in R-MCA territory affecting frontal cortex, anterior coronal radiata and dorsal striatum. 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 Carotid dopplers limited due to poor cooperation--R-ICA with 1-30% ICA stenosis. TEE done revealing EF 55-65% with no PFO and increased RA pressure. Hypercoagulopathy panel pending. MBS done revealing moderate oral and mild pharyngeal dysphagia and patient placed on dysphagia 2, nectar liquids. Neurology recommending ASA for secondary stroke prevention. Therapy evaluations done 11/25 and patient limited by dense L- hemiplegia, poor truncal control with impaired poor safety awareness. CIR recommended by rehab team and MD for follow up therapy.    Total: 12=NIH  Past Medical History  Past Medical History  Diagnosis Date  . Medical history non-contributory     Family History  family history includes Stroke in his mother and sister.  Prior  Rehab/Hospitalizations:  None, however had left ankle surgery 2 weeks ago and cast just came off left ankle this past Monday   Current Medications  Current facility-administered medications: acetaminophen (TYLENOL) tablet 650 mg, 650 mg, Oral, Q6H PRN, Maretta Bees, MD, 650 mg at 10/17/14 2230;  antiseptic oral rinse (CPC / CETYLPYRIDINIUM CHLORIDE 0.05%) solution 7 mL, 7 mL, Mouth Rinse, q12n4p, Maretta Bees, MD, 7 mL at 10/17/14 1600 aspirin suppository 300 mg, 300 mg, Rectal, Daily, 300 mg at 10/16/14 1059 **OR** aspirin tablet 325 mg, 325 mg, Oral, Daily, Eduard Clos, MD, 325 mg at 10/18/14 0102;  atorvastatin (LIPITOR) tablet 20 mg, 20 mg, Oral, q1800, Maretta Bees, MD, 20 mg at 10/17/14 1808;  chlorhexidine (PERIDEX) 0.12 % solution 15 mL, 15 mL, Mouth Rinse, BID, Maretta Bees, MD, 15 mL at 10/18/14 0927 enoxaparin (LOVENOX) injection 40 mg, 40 mg, Subcutaneous, Q24H, Eduard Clos, MD, 40 mg at 10/18/14 7253;  folic acid (FOLVITE) tablet 1 mg, 1 mg, Oral, Daily, Eduard Clos, MD, 1 mg at 10/18/14 6644;  [EXPIRED] LORazepam (ATIVAN) injection 0-4 mg, 0-4 mg, Intravenous, Q6H, 0 mg at 10/15/14 0400 **FOLLOWED BY** LORazepam (ATIVAN) injection 0-4 mg, 0-4 mg, Intravenous, Q12H, Eduard Clos, MD, 2 mg at 10/18/14 0500 multivitamin with minerals tablet 1 tablet, 1 tablet, Oral, Daily, Eduard Clos, MD, 1 tablet at 10/18/14 0347;  senna-docusate (Senokot-S) tablet 1 tablet, 1 tablet, Oral, QHS PRN, Eduard Clos, MD;  thiamine (VITAMIN B-1) tablet 100 mg, 100 mg, Oral, Daily, 100 mg at 10/18/14 0927 **OR** [DISCONTINUED]  thiamine (B-1) injection 100 mg, 100 mg, Intravenous, Daily, Eduard ClosArshad N Kakrakandy, MD, 100 mg at 10/16/14 1059  Patients Current Diet: DIET - DYS 1  Precautions / Restrictions Precautions Precautions: Fall Precaution Comments: brace on left ankle from recent fx. Restrictions Weight Bearing Restrictions: No   Prior Activity  Level Community (5-7x/wk): Went out daily.  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  Was driving.   Home Assistive Devices / Equipment Home Assistive Devices/Equipment: None Home Equipment: Crutches  Prior Functional Level Prior Function Level of Independence: Independent  Current Functional Level Cognition  Arousal/Alertness: Awake/alert Overall Cognitive Status: Impaired/Different from baseline Current Attention Level: Sustained Orientation Level: Oriented X4 Following Commands: Follows one step commands inconsistently, Follows one step commands with increased time Safety/Judgement: Decreased awareness of safety, Decreased awareness of deficits General Comments: Pt reporting, "if I had my car, I would walk down to the parking lot and drive to get myself someone to eat," after requiring 2 person assist to get to chair. Attention: Sustained Sustained Attention: Impaired Sustained Attention Impairment: Functional basic (pt with decreased attention to remove phone from ear when finished talking) Memory: Impaired Memory Impairment: Storage deficit, Retrieval deficit, Decreased recall of new information, Decreased short term memory, Prospective memory Decreased Short Term Memory: Verbal complex Awareness: Impaired Awareness Impairment: Intellectual impairment (to left sided weakness and facial deficits) Problem Solving: Impaired (did not call for assist to use call bell for assistance) Safety/Judgment: Impaired    Extremity Assessment (includes Sensation/Coordination)  Upper Extremity Assessment: Defer to OT evaluation LUE Deficits / Details: Fine and gross motor AROM 0; Will assess in supine next session Lower Extremity Assessment: LLE deficits/detail;Generalized weakness;RLE deficits/detail RLE Deficits / Details: Decreased functional strength noted as pt not able to stand on RLE without knee buckling. LLE Deficits / Details: No AROM knee extension, able to perform knee  flexion AROM with cues, no ankle/hip AROM. Grossly ~0/5 knee extension, 0/5 DF, 2/5 knee flexion.     ADLs  Overall ADL's : Needs assistance/impaired Grooming: Wash/dry face, Supervision/safety, Sitting Grooming Details (indicate cue type and reason): Pt able to wipe drool from mouth. Lower Body Dressing: Total assistance, Sitting/lateral leans Lower Body Dressing Details (indicate cue type and reason): Total (A) to don socks. Pt unable to bring R foot across knee without severe L lateral lean and complete LOB while seated EOB. Functional mobility during ADLs: Moderate assistance, +2 for physical assistance General ADL Comments: Pt with significant L lateral lean during static sitting. Pt already demonstrating compensatory strategies by using RUE to move LUE. Mod +2 (A) for functional mobility due to pt uanble to initiate movement with LLE during transfers in today's sessions    Mobility  Overal bed mobility: Needs Assistance Bed Mobility: Supine to Sit Supine to sit: Min assist General bed mobility comments: Heavy use of rails and VC's for technique. Multiple attempts to get to EOB. Increased effort.    Transfers  Overall transfer level: Needs assistance Equipment used: 2 person hand held assist Transfers: Sit to/from Stand, Stand Pivot Transfers Sit to Stand: Mod assist, +2 physical assistance Stand pivot transfers: Mod assist, +2 physical assistance General transfer comment: Mod +2 (A) for sit<>stand with blocking of L knee. Pt required assist for boost to stand and to maintain balance. Verbal cues to pick head up and stand up tall. Mod +2 (A) for stand-pivot to recliner with blocking of L knee and manual (A) to move L foot back. Pt attempted to hop on RLE with  knee buckling noted.     Ambulation / Gait / Stairs / Wheelchair Mobility  Ambulation/Gait General Gait Details: Deferred.    Posture / Balance Dynamic Sitting Balance Sitting balance - Comments: Requires Miin-Mod A to  maintain sitting balance, able to sit unsupported for short periods but fatigues resulting in left lateral lean.    Special needs/care consideration BiPAP/CPAP No CPM No Continuous Drip IV No Dialysis No   Life Vest No Oxygen No Special Bed No Trach Size No Wound Vac (area) No     Skin No                              Bowel mgmt: Last BM 10/16/14 with incontinence and loose BM Bladder mgmt: Voiding with incontinence Diabetic mgmt "Borderline diabetic per family"    Previous Home Environment Living Arrangements: Spouse/significant other Type of Home: House Home Layout: One level Home Access: Stairs to enter Entrance Stairs-Rails: None Entrance Stairs-Number of Steps: 5 Bathroom Shower/Tub: Engineer, manufacturing systemsTub/shower unit Bathroom Toilet: Standard Home Care Services: No Additional Comments: Still driving  Discharge Living Setting Plans for Discharge Living Setting: House, Lives with (comment) (Lives with GF, Charlies ConstableLynette Campbell) Type of Home at Discharge: House Discharge Home Layout: One level Discharge Home Access: Stairs to enter Entrance Stairs-Number of Steps: 3 steps but steep hill to porch and door to house. Does the patient have any problems obtaining your medications?: No  Social/Family/Support Systems Patient Roles: Parent, Other (Comment) (Has a GF, 3 children out of state, and a sister.) Contact Information: Edson Snowballyisha Vaughn - daughter [phone] Anticipated Caregiver: Not sure.  ? may go to a SNF in [place] with daughter, Modena Nunneryyisha Anticipated Caregiver's Contact Information: See above Ability/Limitations of Caregiver: Girlfriend works.  Dtr in town from [place].  Has 2 sons in [place].  Also has a sister.  Family all work. Caregiver Availability: Other (Comment) (Not sure who will provide care.  May need SNF after CIR.) Discharge Plan Discussed with Primary Caregiver: Yes Is Caregiver In Agreement with Plan?: Yes Does Caregiver/Family have Issues with Lodging/Transportation  while Pt is in Rehab?: No  Goals/Additional Needs Patient/Family Goal for Rehab: PT/OT/ST min assist goals Expected length of stay: 18-22 days Cultural Considerations: None Dietary Needs: Dys1, nectar thick liquids Equipment Needs: TBD Pt/Family Agrees to Admission and willing to participate: Yes Program Orientation Provided & Reviewed with Pt/Caregiver Including Roles  & Responsibilities: Yes  Decrease burden of Care through IP rehab admission: Diet advancement, Decrease number of caregivers and Patient/family education  Possible need for SNF placement upon discharge: Yes.  Dtr is interested in placement if [place] if SNF is needed after inpatient rehab stay.  Patient Condition: This patient's condition remains as documented in the consult dated 10/18/14, in which the Rehabilitation Physician determined and documented that the patient's condition is appropriate for intensive rehabilitative care in an inpatient rehabilitation facility. Will admit to inpatient rehab today.  Preadmission Screen Completed By:  Trish MageLogue, Takota Cahalan M, 10/18/2014 11:52 AM ______________________________________________________________________   Discussed status with Dr. Wynn BankerKirsteins on 10/18/14 at 1151 and received telephone approval for admission today.  Admission Coordinator:  Trish MageLogue, Maliah Pyles M, time1151/Date11/27/15

## 2014-10-18 NOTE — CV Procedure (Signed)
Pre op Dx Cryptogenic Stroke  Post op Dx Cryptogenic Stroke  Procedure  Loop Recorder implantation  After routine prep and drape of the left parasternal area, a small incision was created. A Medtronic LINQ Reveal Loop Recorder  Serial Number  [id number] was inserted.    SteriStrip dressing was  applied.  The patient tolerated the procedure without apparent complication.

## 2014-10-18 NOTE — Interval H&P Note (Signed)
Cory SleightStephen [name] was admitted today to Inpatient Rehabilitation with the diagnosis of right MCA infarct.  The patient's history has been reviewed, patient examined, and there is no change in status.  Patient continues to be appropriate for intensive inpatient rehabilitation.  I have reviewed the patient's chart and labs.  Questions were answered to the patient's satisfaction.  Baylor Teegarden T 10/18/2014, 5:56 PM

## 2014-10-18 NOTE — Progress Notes (Signed)
STROKE TEAM PROGRESS NOTE   HISTORY Cory [name] is an 53 y.o. male history of hypertension and hyperlipidemia with noncompliance with treatment, presenting with new onset slurred speech, left facial droop and left hemiplegia. She has no previous history of stroke nor TIA. He was last seen well by his girlfriend on the morning of 10/14/2014. She noticed that he was sleeping more than usual this afternoon. Patient is known to drink alcohol heavily. He was brought to the emergency room by EMS and code stroke was activated. CT scan of his head showed changes involving the right MCA territory consistent with large acute ischemic infarction. NIH stroke score was 16. Patient was not administered TPA secondary to beyond time under for treatment consideration, and ischemic changes on CT scan. He was admitted for further evaluation and treatment.   SUBJECTIVE (INTERVAL HISTORY) The patient's daughters at the bedside. The patient is without complaints. The loop has been implanted. The plan is for mission to the [facility] today.   OBJECTIVE Temp:  [98.1 F (36.7 C)-98.8 F (37.1 C)] 98.1 F (36.7 C) (11/27 0934) Pulse Rate:  [74-97] 95 (11/27 0934) Cardiac Rhythm:  [-] Normal sinus rhythm (11/27 0820) Resp:  [17-20] 20 (11/27 0934) BP: (127-149)/(83-99) 149/99 mmHg (11/27 0934) SpO2:  [99 %-100 %] 100 % (11/27 0934)   Recent Labs Lab 10/15/14 0043 10/15/14 1624  GLUCAP 97 97    Recent Labs Lab 10/15/14 0044 10/15/14 0050 10/16/14 0656 10/16/14 1153 10/18/14 0410  NA 141 141 140 137 139  K 5.6* 5.5* 4.3 4.0 4.1  CL 101 104 103 100 101  CO2 26  --  $R'23 24 23  'Nv$ GLUCOSE 113* 118* 89 89 102*  BUN $Re'8 10 15 16 17  'lRy$ CREATININE 0.93 1.10 1.00 1.10 0.95  CALCIUM 9.1  --  9.1 9.2 9.8  MG  --   --  2.0  --   --   PHOS  --   --  3.3  --   --     Recent Labs Lab 10/15/14 0044  AST 24  ALT 13  ALKPHOS 58  BILITOT 0.3  PROT 8.5*  ALBUMIN 3.6    Recent Labs Lab  10/15/14 0044 10/15/14 0050  WBC 5.1  --   NEUTROABS 2.0  --   HGB 15.6 17.7*  HCT 47.3 52.0  MCV 86.5  --   PLT 220  --    No results for input(s): CKTOTAL, CKMB, CKMBINDEX, TROPONINI in the last 168 hours.  Recent Labs  10/16/14 1153  LABPROT 14.1  INR 1.08   No results for input(s): COLORURINE, LABSPEC, PHURINE, GLUCOSEU, HGBUR, BILIRUBINUR, KETONESUR, PROTEINUR, UROBILINOGEN, NITRITE, LEUKOCYTESUR in the last 72 hours.  Invalid input(s): APPERANCEUR     Component Value Date/Time   CHOL 180 10/15/2014 0044   TRIG 115 10/15/2014 0044   HDL 39* 10/15/2014 0044   CHOLHDL 4.6 10/15/2014 0044   VLDL 23 10/15/2014 0044   LDLCALC 118* 10/15/2014 0044   Lab Results  Component Value Date   HGBA1C 6.1* 10/15/2014      Component Value Date/Time   LABOPIA NONE DETECTED 10/15/2014 0056   COCAINSCRNUR NONE DETECTED 10/15/2014 0056   LABBENZ NONE DETECTED 10/15/2014 0056   AMPHETMU NONE DETECTED 10/15/2014 0056   THCU NONE DETECTED 10/15/2014 0056   LABBARB NONE DETECTED 10/15/2014 0056     Recent Labs Lab 10/15/14 0044  ETH 103*    Ct Angio Head and Neck W/cm &/or Wo Cm 10/16/2014  1. 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  2. Intracranial CTA otherwise negative except for dolichoectasia.  3. Negative neck CTA except for tortuous ICAs. No right carotid plaque or stenosis identified.  4. Expected evolution of overall moderate size right MCA infarct. Mass effect with mild leftward midline shift (2 mm). No hemorrhagic transformation.     MRI Brain 10/15/2014 Motion degraded limited exam with evidence of large right hemispheric acute infarct with mild local mass effect upon the right lateral ventricle as noted above.   Dg Swallowing Func-speech Pathology 10/16/2014    Diet Recommendation Dysphagia 2 (Fine chop);Nectar-thick liquid    Liquid Administration via: Cup Medication Administration: Whole meds with puree Supervision:  Patient able to self feed;Full supervision/cueing  for compensatory strategies Compensations: Small sips/bites;Slow rate;Check for  pocketing;Check for anterior loss Postural Changes and/or Swallow Maneuvers: Seated upright 90  degrees    Other  Recommendations Oral Care Recommendations: Oral care BID Other Recommendations: Order thickener from pharmacy   Follow Up Recommendations   (tba)    Frequency and Duration min 3x week  2 weeks            PHYSICAL EXAM Pleasant middle aged african [place] male not in distress. . Afebrile. Head is nontraumatic. Neck is supple without bruit. Hearing is normal. Cardiac exam no murmur or gallop. Lungs are clear to auscultation. Distal pulses are well felt. Neurological Exam : Awake alert follows commands well. No dysarthria. No aphasia. Right gaze preference but able to look to the left was midline. Fundi were not visualized. Dense left homonymous hemianopsia. Mild left lower facial weakness. Tongue deviates to the left. Dense left hemiplegia with 0/5 left upper extremity and 1/5 left lower extremity strength. Diminished left hemi-sensation. No left hemineglect   Good strength sensation and coordination on the right side. Right plantar downgoing. Left downgoing. Gait was not tested.   ASSESSMENT/PLAN Cory [name] is a 53 y.o. male with history of hypertension and hyperlipidemia with noncompliance with treatment and heavy etoh use presenting with new onset slurred speech, left facial droop and left hemiplegia. He did not receive IV t-PA due to delay in arrival.   Stroke:  Non-dominant large right MCA infarct, embolic secondary to unknown source  Resultant  Left hemiparesis  MRI  Large R MCA with mass effect on R lateral ventricle  MRA  Unable to tolerate  CTA brain - as above  Carotid Doppler  1-39% right internal carotid artery stenosis. Unable to visualize the left internal carotid and bilateral vertebral arteries due to poor patient  cooperation.   2D Echo  Left ventricle: The cavity size was normal. Wall thickness was increased in a pattern of mild LVH. Systolic function was normal. The estimated ejection fraction was in the range of 55% to 60%. Wall motion was normal; there were no regional wall motion abnormalities.  HgbA1c 6.1  UDS negative  TEE to look for embolic source. Arranged with [facility] for tomorrow or Friday, based on their schedule.  If positive for PFO (patent foramen ovale), check bilateral lower extremity venous dopplers to rule out DVT as possible source of stroke. (I have made patient NPO after midnight tonight in case they can do tomorrow).   Given his young age, will check hypercoagulable tests (lupus anticoagulant, cardiolipin antibody) and vasculitic labs (C3, C4, CH50, ANA, ESR) as well as HIV and RPR as possible stroke sources.  Lovenox 40 mg sq daily for VTE prophylaxis  DIET - DYS 1 . ST to assess.  no antithrombotics prior to admission, now on aspirin 300 mg suppository daily  Ongoing aggressive stroke risk factor management  Therapy recommendations:  rehab  Disposition:  rehab  Hyperlipidemia  LDL 118, goal < 70  Add statin once able to swallow  Continue statin at discharge  Other Stroke Risk Factors  Former Cigarette smoker, stopped smoking per record  ETOH abuse, heavy, placed on CIWA protocol  Other Active Problems  hyperkalemia   Admit to the [facility] inpatient rehabilitation center today. Follow-up with Dr. Leonie Man in 2 months.   Hospital day # [place] PA-C [facility] Pager [phone] 10/18/2014, 11:21 AM   I have personally examined this patient, reviewed notes, independently viewed imaging studies, participated in medical decision making and plan of care. I have made any additions or clarifications directly to the above note. Agree with note above.  Antony Contras, MD  To contact  Stroke Continuity provider, please refer to [email or web address] After hours, contact General Neurology

## 2014-10-18 NOTE — Discharge Instructions (Signed)
Do not get loop recorder site wet for 3 days, then may shower as usual.

## 2014-10-18 NOTE — Progress Notes (Signed)
Physical Therapy Treatment Patient Details Name: Naida SleightStephen Kazmierczak MRN: [id number] DOB: 1961/08/22 Today's Date: 10/18/2014    History of Present Illness 53 yo male admitted with Lt facial droop and Lt hemiplegia. CT (+) Rt MCA NIH :16 PMH: ETOH abuse, HTN, hx of ankle fx    PT Comments    Patient progressing with mobility this session. Patient able to take a couple of steps with +2 assist for LLE control and support. Decreased weight shifting. Patient with L inattention and awareness. Continue to recommend comprehensive inpatient rehab (CIR) for post-acute therapy needs.   Follow Up Recommendations  CIR;Supervision/Assistance - 24 hour     Equipment Recommendations       Recommendations for Other Services       Precautions / Restrictions Precautions Precautions: Fall Precaution Comments: brace on left ankle from recent fx.    Mobility  Bed Mobility Overal bed mobility: Needs Assistance       Supine to sit: Min assist     General bed mobility comments: Heavy use of rails and VC's for technique. Min assist for trunk support Increased effort.  Transfers Overall transfer level: Needs assistance Equipment used: 2 person hand held assist   Sit to Stand: Mod assist;+2 physical assistance         General transfer comment: Mod +2 (A) for sit<>stand with blocking of L knee. Pt required assist for boost to stand and to maintain balance. Verbal cues to pick head up and stand up tall.   Ambulation/Gait Ambulation/Gait assistance: Mod assist;+2 physical assistance;Max assist Ambulation Distance (Feet): 6 Feet Assistive device: 2 person hand held assist Gait Pattern/deviations: Step-to pattern;Decreased dorsiflexion - left;Decreased weight shift to right;Narrow base of support   Gait velocity interpretation: Below normal speed for age/gender General Gait Details: A to position LLE and block L knee. A for weight shifting and balance with ambulation   Stairs            Wheelchair Mobility    Modified Rankin (Stroke Patients Only) Modified Rankin (Stroke Patients Only) Pre-Morbid Rankin Score: No symptoms Modified Rankin: Moderately severe disability     Balance     Sitting balance-Leahy Scale: Fair       Standing balance-Leahy Scale: Zero                      Cognition Arousal/Alertness: Awake/alert Behavior During Therapy: Flat affect Overall Cognitive Status: Impaired/Different from baseline Area of Impairment: Attention;Following commands;Safety/judgement;Awareness;Problem solving   Current Attention Level: Sustained Memory: Decreased short-term memory Following Commands: Follows one step commands inconsistently;Follows one step commands with increased time Safety/Judgement: Decreased awareness of safety;Decreased awareness of deficits   Problem Solving: Difficulty sequencing;Requires verbal cues General Comments: Patient stated that if he had crutches he would be able to walk better and use his arm    Exercises      General Comments        Pertinent Vitals/Pain Pain Assessment: No/denies pain    Home Living       Type of Home: House              Prior Function            PT Goals (current goals can now be found in the care plan section) Progress towards PT goals: Progressing toward goals    Frequency  Min 4X/week    PT Plan Current plan remains appropriate    Co-evaluation             End  of Session Equipment Utilized During Treatment: Gait belt Activity Tolerance: Patient tolerated treatment well Patient left: in chair;with call bell/phone within reach;with chair alarm set     Time: [id number] PT Time Calculation (min) (ACUTE ONLY): 24 min  Charges:  $Gait Training: 8-22 mins $Therapeutic Activity: 8-22 mins                    G Codes:      Fredrich BirksRobinette, Julia Elizabeth 10/18/2014, 12:50 PM  10/18/2014 Fredrich Birksobinette, Julia Elizabeth PTA [phone] pager [phone] office

## 2014-10-19 ENCOUNTER — Inpatient Hospital Stay (HOSPITAL_COMMUNITY): Payer: Medicaid Other | Admitting: Speech Pathology

## 2014-10-19 ENCOUNTER — Inpatient Hospital Stay (HOSPITAL_COMMUNITY): Payer: Medicaid Other | Admitting: Occupational Therapy

## 2014-10-19 ENCOUNTER — Inpatient Hospital Stay (HOSPITAL_COMMUNITY): Payer: Medicaid Other | Admitting: *Deleted

## 2014-10-19 MED ORDER — RESOURCE THICKENUP CLEAR PO POWD
ORAL | Status: DC | PRN
  Filled 2014-10-19: qty 125

## 2014-10-19 MED ORDER — TRAMADOL HCL 50 MG PO TABS
50.0000 mg | ORAL_TABLET | Freq: Two times a day (BID) | ORAL | Status: DC | PRN
  Administered 2014-10-19 – 2014-11-27 (×16): 50 mg via ORAL
  Filled 2014-10-19 (×18): qty 1

## 2014-10-19 NOTE — Progress Notes (Signed)
Occupational Therapy Assessment and Plan  Patient Details  Name: Toney Difatta MRN: [id number] Date of Birth: [birth year]  OT Diagnosis: abnormal posture, cognitive deficits, disturbance of vision and hemiplegia affecting non-dominant side Rehab Potential: Rehab Potential (ACUTE ONLY): Good ELOS: 21-24 days   Today's Date: 10/19/2014 OT Individual Time:  4431- 5400       Problem List:  Patient Active Problem List   Diagnosis Date Noted  . CVA (cerebral infarction) 10/18/2014  . Right middle cerebral artery stroke   . Dyslipidemia   . Acute CVA (cerebrovascular accident) 10/15/2014  . Stroke 10/15/2014  . Alcoholism 10/15/2014    Past Medical History:  Past Medical History  Diagnosis Date  . Medical history non-contributory    Past Surgical History:  Past Surgical History  Procedure Laterality Date  . No past surgeries      Assessment & Plan Clinical Impression: Patient is a 53 y.o. year old male with history of HTN, Hyperlipidemia, heavy alcohol use, noncompliance with meds; who was admitted on 10/15/14 with left facial droop, slurred speech, left sided weakness and somnolence. CT head with ischemic changes in R-MCA territory affecting frontal cortex, anterior coronal radiata and dorsal striatum. 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 Carotid dopplers limited due to poor cooperation--R-ICA with 1-30% ICA stenosis. TEE done revealing EF 55-65% with no PFO and increased RA pressure. Hypercoagulopathy panel pending. MBS done revealing moderate oral and mild pharyngeal dysphagia and patient placed on dysphagia 2, nectar liquids. Neurology recommending ASA for secondary stroke prevention. Therapy evaluations done 11/25 and patient limited by dense L- hemiplegia, poor truncal control with impaired poor safety awareness. Plans for loop recorder today? CIR recommended by rehab  team and MD for follow up therapy. Patient transferred to CIR on 10/18/2014 .    Patient currently requires Mod - total A with basic self-care skills secondary to muscle weakness, decreased cardiorespiratoy endurance, decreased coordination and decreased motor planning, L inattention, decreased midline orientation and decreased attention to left, decreased problem solving and decreased safety awareness and decreased sitting balance, decreased standing balance, hemiplegia and decreased balance strategies.  Prior to hospitalization, patient could complete ADLs with independent .  Patient will benefit from skilled intervention to decrease level of assist with basic self-care skills prior to discharge home vs SNF pending availability of caregiver.  Anticipate patient will require 24 hour supervision and minimal physical assistance and HHOT vs SNF rehab pending discharge situation..    Skilled Therapeutic Intervention Upon entering the room,pt supine in bed with no c/o pain. OT educated pt on OT purpose, POC, and goals with pt accepting. Pt required Max A for supine >sit to EOB for B & D session. Pt required Min- Mod A for dynamic seated balance while engaging in functional tasks. Pt with L lateral lean in seated and standing. Pt standing with Max A for clothing management and to wash buttocks and peri area. Max A squat pivot to wheelchair from bed. Once seated in wheelchair breakfast tray set up. Pt requiring assistance to check for pocketing and verbal cues for safety such as taking smaller sips/bites, drink fluid, and slow down. Pt seated in wheelchair awaiting next therapist with QRB donned and call bell within reach.   OT Evaluation Precautions/Restrictions  Precautions Precautions: Fall Precaution Comments: L inattention, brace on L ankle from recent fx Restrictions Weight Bearing Restrictions: No Vital Signs Therapy Vitals Temp: 98 F (36.7  C) Temp Source: Oral Pulse Rate: 71 Resp: 18 BP:  127/84 mmHg Patient Position (if appropriate): Lying Oxygen Therapy SpO2: 99 % O2 Device: Not Delivered Pain Pain Assessment Pain Assessment: No/denies pain Home Living/Prior Functioning Home Living Family/patient expects to be discharged to:: Private residence Living Arrangements: Spouse/significant other Vision/Perception  Vision- History Baseline Vision/History: No visual deficits Patient Visual Report: No change from baseline Vision- Assessment Vision Assessment?: Yes Eye Alignment: Within Functional Limits Ocular Range of Motion: Within Functional Limits Alignment/Gaze Preference: Chin down Tracking/Visual Pursuits: Decreased smoothness of horizontal tracking;Decreased smoothness of vertical tracking;Requires cues, head turns, or add eye shifts to track  Cognition Overall Cognitive Status: Impaired/Different from baseline Arousal/Alertness: Suspect due to medications Orientation Level: Oriented X4 Attention: Sustained Sustained Attention: Impaired Sustained Attention Impairment: Functional basic Memory: Impaired Memory Impairment: Storage deficit;Retrieval deficit;Decreased recall of new information;Decreased short term memory;Prospective memory Decreased Short Term Memory: Verbal complex;Functional basic Awareness: Impaired Awareness Impairment: Emergent impairment Problem Solving: Impaired Problem Solving Impairment: Verbal complex;Functional basic Behaviors: Impulsive;Perseveration Safety/Judgment: Impaired Sensation Sensation Light Touch: Impaired by gross assessment Stereognosis: Not tested Hot/Cold: Impaired by gross assessment Proprioception: Impaired by gross assessment Additional Comments: light touch, temp, and proprioception appear to be decreased on L UE and LE Coordination Gross Motor Movements are Fluid and Coordinated: No Fine Motor Movements are Fluid and Coordinated: No Coordination and Movement Description: secondary to decreased strength and ROM  of L UE and L LE Motor  Motor Motor: Hemiplegia;Abnormal tone;Abnormal postural alignment and control Motor - Skilled Clinical Observations: L lateral trunk lean in sitting and standing, L UE with 0/5 strength throughout Mobility  Bed Mobility Bed Mobility: Sitting - Scoot to Edge of Bed Supine to Sit: 3: Mod assist;With rails;HOB elevated Supine to Sit Details: Manual facilitation for weight shifting;Verbal cues for precautions/safety;Verbal cues for sequencing;Verbal cues for technique Sitting - Scoot to Edge of Bed: 3: Mod assist Sitting - Scoot to Edge of Bed Details: Manual facilitation for weight shifting;Manual facilitation for placement;Verbal cues for technique;Verbal cues for sequencing;Tactile cues for weight shifting;Tactile cues for sequencing;Tactile cues for initiation Transfers Transfers: Sit to Stand;Stand to Sit Sit to Stand: 3: Mod assist;2: Max assist;With armrests;With upper extremity assist;From chair/3-in-1 Sit to Stand Details: Manual facilitation for weight shifting;Manual facilitation for placement;Manual facilitation for weight bearing;Verbal cues for precautions/safety;Verbal cues for sequencing;Verbal cues for technique Stand to Sit: 2: Max assist;With upper extremity assist;With armrests;To chair/3-in-1 Stand to Sit Details (indicate cue type and reason): Verbal cues for sequencing;Manual facilitation for weight shifting;Verbal cues for precautions/safety;Manual facilitation for weight bearing;Verbal cues for technique;Manual facilitation for placement  Trunk/Postural Assessment  Cervical Assessment Cervical Assessment: Within Functional Limits Thoracic Assessment Thoracic Assessment: Within Functional Limits Lumbar Assessment Lumbar Assessment: Within Functional Limits Postural Control Postural Control: Deficits on evaluation Postural Limitations: L lateral trunk lean  Balance Balance Balance Assessed: Yes Static Sitting Balance Static Sitting - Balance  Support: Right upper extremity supported;Feet supported Static Sitting - Level of Assistance: 4: Min assist;5: Stand by assistance Dynamic Sitting Balance Sitting balance - Comments: Mod A progressing to Min A during bathing and dressing seated EOB Extremity/Trunk Assessment RUE Assessment RUE Assessment: Within Functional Limits LUE Assessment LUE Assessment: Exceptions to WFL LUE AROM (degrees) Overall AROM Left Upper Extremity: Deficits LUE Strength LUE Overall Strength: Deficits (0/5 strength throughout L UE)  FIM:  FIM - Eating Eating Activity: 5: Set-up assist for open containers;4: Helper checks for pocketed food FIM - Grooming Grooming Steps: Wash, rinse, dry face;Wash, rinse, dry hands Grooming:  5: Set-up assist to obtain items FIM - Bathing Bathing Steps Patient Completed: Chest;Abdomen;Front perineal area;Right upper leg;Left upper leg Bathing: 3: Mod-Patient completes 5-7 75f 10 parts or 50-74% FIM - Upper Body Dressing/Undressing Upper body dressing/undressing steps patient completed: Thread/unthread right sleeve of pullover shirt/dresss Upper body dressing/undressing: 2: Max-Patient completed 25-49% of tasks FIM - Lower Body Dressing/Undressing Lower body dressing/undressing: 1: Total-Patient completed less than 25% of tasks FIM - Control and instrumentation engineer Devices: Arm rests Bed/Chair Transfer: 2: Supine > Sit: Max A (lifting assist/Pt. 25-49%);2: Bed > Chair or W/C: Max A (lift and lower assist) FIM - Tub/Shower Transfers Tub/shower Transfers: 0-Activity did not occur or was simulated   Refer to Care Plan for Long Term Goals  Recommendations for other services: Neuropsych  Discharge Criteria: Patient will be discharged from OT if patient refuses treatment 3 consecutive times without medical reason, if treatment goals not met, if there is a change in medical status, if patient makes no progress towards goals or if patient is discharged from  hospital.  The above assessment, treatment plan, treatment alternatives and goals were discussed and mutually agreed upon: by patient  Phineas Semen 10/19/2014, 8:11 AM

## 2014-10-19 NOTE — Evaluation (Signed)
Physical Therapy Assessment and Plan  Patient Details  Name: Cory [name] MRN: [id number] Date of Birth: 05/25/1961  PT Diagnosis: Abnormal posture, Abnormality of gait, Cognitive deficits, Coordination disorder, Hemiparesis non-dominant, Hypertonia, Hypotonia, Impaired cognition, Impaired sensation, Muscle weakness and Pain in L ankle Rehab Potential: Good ELOS: 22-25 days   Today's Date: 10/19/2014 PT Individual Time: 1100-1200 PT Individual Time Calculation (min): 60 min    Problem List:  Patient Active Problem List   Diagnosis Date Noted  . CVA (cerebral infarction) 10/18/2014  . Right middle cerebral artery stroke   . Dyslipidemia   . Acute CVA (cerebrovascular accident) 10/15/2014  . Stroke 10/15/2014  . Alcoholism 10/15/2014    Past Medical History:  Past Medical History  Diagnosis Date  . Medical history non-contributory    Past Surgical History:  Past Surgical History  Procedure Laterality Date  . No past surgeries      Assessment & Plan Clinical Impression: Cory [name] is a 53 y.o. RH- male with history of HTN, Hyperlipidemia, heavy alcohol use, noncompliance with meds; who was admitted on 10/15/14 with left facial droop, slurred speech, left sided weakness and somnolence. CT head with ischemic changes in R-MCA territory affecting frontal cortex, anterior coronal radiata and dorsal striatum. 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 Carotid dopplers limited due to poor cooperation--R-ICA with 1-30% ICA stenosis. TEE done revealing EF 55-65% with no PFO and increased RA pressure. Hypercoagulopathy panel pending. MBS done revealing moderate oral and mild pharyngeal dysphagia and patient placed on dysphagia 2, nectar liquids. Neurology recommending ASA for secondary stroke prevention. Therapy evaluations done 11/25 and patient limited by dense L-  hemiplegia, poor truncal control with impaired poor safety awareness. Plans for loop recorder today? CIR recommended by rehab team and MD for follow up therapy. Patient transferred to CIR on 10/18/2014 .   Patient currently requires max with mobility secondary to muscle weakness, decreased cardiorespiratoy endurance, impaired timing and sequencing, abnormal tone, unbalanced muscle activation and decreased coordination, decreased midline orientation and decreased attention to left, decreased attention, decreased awareness, decreased problem solving, decreased safety awareness, decreased memory and delayed processing and decreased sitting balance, decreased standing balance, decreased postural control, hemiplegia and decreased balance strategies.  Prior to hospitalization, patient was independent  with mobility and lived with Significant other, Friend(s), Other (Comment) (GF works) in a [employer].  Home access is 5-6Stairs to enter.  Patient will benefit from skilled PT intervention to maximize safe functional mobility, minimize fall risk and decrease caregiver burden for planned discharge home with 24 hour assist.  Anticipate patient will require HHPT vs. SNF depending on whether patient has 24/7 caregiver at discharge.  PT - End of Session Activity Tolerance: Tolerates 30+ min activity with multiple rests Endurance Deficit: Yes Endurance Deficit Description: fatigues quickly, requires frequent rest breaks PT Assessment Rehab Potential (ACUTE/IP ONLY): Good Barriers to Discharge: Decreased caregiver support;Inaccessible home environment PT Patient demonstrates impairments in the following area(s): Balance;Endurance;Motor;Perception;Safety;Sensory PT Transfers Functional Problem(s): Bed Mobility;Bed to Chair;Car;Furniture PT Locomotion Functional Problem(s): Ambulation;Wheelchair Mobility;Stairs PT Plan PT Intensity: Minimum of 1-2 x/day ,45 to 90 minutes PT Frequency: 5 out of 7 days PT Duration  Estimated Length of Stay: 22-25 days PT Treatment/Interventions: Ambulation/gait training;Disease management/prevention;Pain management;Stair training;Visual/perceptual remediation/compensation;Wheelchair propulsion/positioning;Therapeutic Activities;Patient/family education;DME/adaptive equipment instruction;Balance/vestibular training;Cognitive remediation/compensation;Psychosocial support;Therapeutic Exercise;UE/LE Strength taining/ROM;Functional mobility training;Community reintegration;Discharge planning;Neuromuscular re-education;Splinting/orthotics;UE/LE Coordination activities PT Transfers Anticipated Outcome(s): supervision PT Locomotion Anticipated Outcome(s): minA  gait, supervision wheelchair mobility PT Recommendation Recommendations for Other Services: Speech consult;Neuropsych consult Follow Up Recommendations: Home health PT;Other (comment);24 hour supervision/assistance;Skilled nursing facility (HHPT vs. SNF, unsure if patient will have 24/'7 caregiver) Patient destination: Home (home vs. SNF; unsure if patient with have 24/'7 caregiver) Equipment Recommended: To be determined Equipment Details: Patient owns crutches, but no other DME; recommendations TBD upon discharge.  Skilled Therapeutic Intervention Skilled therapeutic intervention initiated after completion of evaluation. Discussed falls risk, safety within room, and focus of therapy during stay. Discussed possible LOS, goals, and f/u therapy. Patient will require reinforcement. Patient very lethargic during evaluation and essentially nonverbal with exception of one word responses or head nods. According to RN, patient had been given pain medication shortly before evaluation. Added Cory [name] cushion to patient's wheelchair due to decreased ability to perform functional weight shifts, both physically and cognitively. Added L half lap tray to support L UE. Patient will likely benefit from removal or wheelchair cushion or hemi height  wheelchair to improve ability to self-propel wheelchair secondary to R LE not touching floor.  PT Evaluation Precautions/Restrictions Precautions Precautions: Fall Precaution Comments: L inattention, brace on L ankle from recent fx Restrictions Weight Bearing Restrictions: No General Chart Reviewed: Yes Family/Caregiver Present: No  Pain Pain Assessment Pain Assessment: No/denies pain Pain Score: 0-No pain Home Living/Prior Functioning Home Living Available Help at Discharge: Available PRN/intermittently;Friend(s);Family Type of Home: House Home Access: Stairs to enter [employer] of Steps: 5-6 Entrance Stairs-Rails: None Home Layout: One level Additional Comments: Patient is poor historian and unable to provide specific information abotu discharge situation; pre-admission states patient may not have 24/'7 caregiver and may require SNF  Lives With: Significant other;Friend(s);Other (Comment) (GF works) Prior Function Level of Independence: Independent with basic ADLs;Independent with homemaking with ambulation;Independent with gait;Independent with transfers  Able to Take Stairs?: Yes Driving: Yes Vocation: Unemployed Vocation Requirements: 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 Vision/Perception  Vision - Assessment Eye Alignment: Within Functional Limits Ocular Range of Motion: Within Functional Limits Tracking/Visual Pursuits: Decreased smoothness of horizontal tracking;Decreased smoothness of vertical tracking;Requires cues, head turns, or add eye shifts to track  Cognition Overall Cognitive Status: Impaired/Different from baseline Arousal/Alertness: Lethargic Orientation Level: Oriented X4 Attention: Sustained Sustained Attention: Impaired Sustained Attention Impairment: Functional basic Memory: Impaired Memory Impairment: Storage deficit;Retrieval deficit;Decreased recall of new  information;Decreased short term memory;Prospective memory Decreased Short Term Memory: Verbal complex Awareness: Impaired Awareness Impairment: Emergent impairment;Anticipatory impairment Problem Solving: Impaired Safety/Judgment: Impaired Sensation Sensation Light Touch: Impaired by gross assessment Proprioception: Impaired by gross assessment Additional Comments: Light touch and proprioception appear impaired on L LE, but difficult to assess secondary to cognitive deficits/alertness level Coordination Gross Motor Movements are Fluid and Coordinated: No Fine Motor Movements are Fluid and Coordinated: No Motor  Motor Motor: Hemiplegia;Abnormal tone;Abnormal postural alignment and control Motor - Skilled Clinical Observations: L lateral trunk lean in sitting and standing; trunk flexed in standing  Mobility Bed Mobility Bed Mobility: Sitting - Scoot to Edge of Bed Supine to Sit: 3: Mod assist;With rails;HOB elevated Supine to Sit Details: Manual facilitation for weight shifting;Verbal cues for precautions/safety;Verbal cues for sequencing;Verbal cues for technique Sitting - Scoot to Edge of Bed: 3: Mod assist Sitting - Scoot to Edge of Bed Details: Manual facilitation for weight shifting;Manual facilitation for placement;Verbal cues for technique;Verbal cues for sequencing;Tactile cues for weight shifting;Tactile cues for sequencing;Tactile cues for initiation Transfers Transfers: Yes Sit to  Stand: 3: Mod assist;2: Max assist;With armrests;With upper extremity assist;From chair/3-in-1 Sit to Stand Details: Manual facilitation for weight shifting;Manual facilitation for placement;Manual facilitation for weight bearing;Verbal cues for precautions/safety;Verbal cues for sequencing;Verbal cues for technique Stand to Sit: 2: Max assist;With upper extremity assist;With armrests;To chair/3-in-1 Stand to Sit Details (indicate cue type and reason): Verbal cues for sequencing;Manual facilitation  for weight shifting;Verbal cues for precautions/safety;Manual facilitation for weight bearing;Verbal cues for technique;Manual facilitation for placement Squat Pivot Transfers: 2: Max assist;With armrests;With upper extremity assistance Squat Pivot Transfer Details: Verbal cues for sequencing;Manual facilitation for weight shifting;Verbal cues for precautions/safety;Manual facilitation for weight bearing;Verbal cues for technique;Manual facilitation for placement Locomotion  Ambulation Ambulation: Yes Ambulation/Gait Assistance: 1: +2 Total assist;2: Max assist (maxA gait training; +2 for w/c follow) Ambulation Distance (Feet): 14 Feet Assistive device: Other (Comment) (R handrail) Ambulation/Gait Assistance Details: Manual facilitation for placement;Manual facilitation for weight bearing;Manual facilitation for weight shifting;Verbal cues for sequencing;Verbal cues for technique;Tactile cues for sequencing;Visual cues/gestures for sequencing;Tactile cues for posture Ambulation/Gait Assistance Details: L DF ace wrap applied prior to gait training and stair negotiation; Patient performed gait training 14' x1 with R handrail and maxA, +2 for wheelchair follow. Patient requires manual facilitation for upright posture, midline orientation, advancement/placement of L LE during swing phase and stabilization of L knee during stance phase secondary to buckling with weight bearing. Gait Gait: Yes Gait Pattern: Impaired Gait Pattern: Step-to pattern;Step-through pattern;Decreased stride length;Decreased step length - right;Decreased stance time - left;Decreased weight shift to left;Trunk flexed;Lateral trunk lean to left;Decreased dorsiflexion - left Stairs / Additional Locomotion Stairs: Yes Stairs Assistance: 2: Max assist Stairs Assistance Details: Manual facilitation for weight shifting;Manual facilitation for placement;Manual facilitation for weight bearing;Verbal cues for technique;Verbal cues for  precautions/safety;Tactile cues for sequencing;Tactile cues for posture Stairs Assistance Details (indicate cue type and reason): Patient ascends step forwards, descends backwards. Stair Management Technique: One rail Right;Step to pattern;Forwards;Backwards Number of Stairs: 1 Height of Stairs: 4 Architect: Yes Wheelchair Assistance: 2: Max Technical sales engineer Details: Verbal cues for precautions/safety;Verbal cues for technique;Verbal cues for sequencing;Tactile cues for placement;Tactile cues for sequencing;Verbal cues for safe use of DME/AE Wheelchair Propulsion: Right upper extremity (R LE unable to assist with propulsion due to Cory [name] cushion placed in w/c; recommend removal of cushion or hemi height w/c to improve w/c mobility) Wheelchair Parts Management: Needs assistance Distance: 75  Trunk/Postural Assessment  Cervical Assessment Cervical Assessment: Within Functional Limits Thoracic Assessment Thoracic Assessment: Within Functional Limits Lumbar Assessment Lumbar Assessment: Within Functional Limits Postural Control Postural Control: Deficits on evaluation Righting Reactions: delayed Protective Responses: delayed Postural Limitations: L lateral trunk lean in sitting and standing; trunk flexion in sitting and standing; increased posterior pelvic tilt in unsupported sitting.  Balance Balance Balance Assessed: Yes Static Sitting Balance Static Sitting - Balance Support: Right upper extremity supported;Feet supported Static Sitting - Level of Assistance: 4: Min assist;5: Stand by assistance Static Standing Balance Static Standing - Balance Support: Right upper extremity supported;During functional activity Static Standing - Level of Assistance: 2: Max assist Extremity Assessment  RLE Assessment RLE Assessment: Within Functional Limits LLE Assessment LLE Assessment: Exceptions to [employer] LLE Strength LLE Overall Strength: Deficits;Due to  impaired cognition LLE Overall Strength Comments: Grossly 1/5 overall; noted trace muscle activation, but no AROM present. LLE Tone LLE Tone: Modified Ashworth Modified Ashworth Scale for Grading Hypertonia LLE: Slight increase in muscle tone, manifested by a catch and release or by minimal resistance at the end of the range  of motion when the affected part(s) is moved in flexion or extension (L hamstrings) LLE Tone Comments: Mild tone L LE hamstrings noted in sitting; L hamstrings tone increases with activity; patient may benefit from stretching prior to activity  FIM:  FIM - Control and instrumentation engineer Devices: Arm rests;Bed rails;HOB elevated Bed/Chair Transfer: 3: Supine > Sit: Mod A (lifting assist/Pt. 50-74%/lift 2 legs;2: Chair or W/C > Bed: Max A (lift and lower assist);2: Bed > Chair or W/C: Max A (lift and lower assist) FIM - Locomotion: Wheelchair Distance: 75 Locomotion: Wheelchair: 2: Travels 50 - 149 ft with maximal assistance (Pt: 25 - 49%) FIM - Locomotion: Ambulation Locomotion: Ambulation Assistive Devices: Other (comment);Orthosis (R handrail; L DF ace wrap) Ambulation/Gait Assistance: 1: +2 Total assist;2: Max assist (maxA gait training; +2 for w/c follow) Locomotion: Ambulation: 1: Two helpers FIM - Locomotion: Stairs Locomotion: Scientist, physiological: Insurance account manager - 1 Locomotion: Stairs: 1: Up and Down < 4 stairs with maximal assistance (Pt: 25 - 49%)   Refer to Care Plan for Long Term Goals  Recommendations for other services: Neuropsych  Discharge Criteria: Patient will be discharged from PT if patient refuses treatment 3 consecutive times without medical reason, if treatment goals not met, if there is a change in medical status, if patient makes no progress towards goals or if patient is discharged from hospital.  The above assessment, treatment plan, treatment alternatives and goals were discussed and mutually agreed upon: by  patient  Lillia Abed. Donovan Gatchel, PT, DPT 10/19/2014, 12:11 PM

## 2014-10-19 NOTE — Plan of Care (Signed)
Problem: RH BLADDER ELIMINATION Goal: RH STG MANAGE BLADDER WITH ASSISTANCE STG Manage Bladder With Mod I Assistance  Outcome: Not Progressing  Problem: RH SAFETY Goal: RH STG ADHERE TO SAFETY PRECAUTIONS W/ASSISTANCE/DEVICE STG Adhere to Safety Precautions With min Assistance/Device.  Outcome: Not Progressing Goal: RH STG DECREASED RISK OF FALL WITH ASSISTANCE STG Decreased Risk of Fall With min Assistance.  Outcome: Not Progressing  Problem: RH COGNITION-NURSING Goal: RH STG ANTICIPATES NEEDS/CALLS FOR ASSIST W/ASSIST/CUES STG Anticipates Needs/Calls for Assist With min Assistance/Cues.  Outcome: Progressing  Problem: RH PAIN MANAGEMENT Goal: RH STG PAIN MANAGED AT OR BELOW PT'S PAIN GOAL Less than 2  Outcome: Progressing

## 2014-10-19 NOTE — Evaluation (Signed)
Speech Language Pathology Assessment and Plan  Patient Details  Name: Cory [name] MRN: [id number] Date of Birth: [birth year]  SLP Diagnosis: Dysarthria;Cognitive Impairments;Dysphagia  Rehab Potential: Good ELOS: 21-24   Today's Date: 10/19/2014 SLP Individual Time: 0800-0900 SLP Individual Time Calculation (min): 60 min  Problem List:  Patient Active Problem List   Diagnosis Date Noted  . CVA (cerebral infarction) 10/18/2014  . Right middle cerebral artery stroke   . Dyslipidemia   . Acute CVA (cerebrovascular accident) 10/15/2014  . Stroke 10/15/2014  . Alcoholism 10/15/2014   Past Medical History:  Past Medical History  Diagnosis Date  . Medical history non-contributory    Past Surgical History:  Past Surgical History  Procedure Laterality Date  . No past surgeries      Assessment / Plan / Recommendation Clinical Impression Cory [name] is a 53 year old right-handed male with history of HTN, Hyperlipidemia, heavy alcohol use, noncompliance with meds; who was admitted on 10/15/14 with left facial droop, slurred speech, left sided weakness and somnolence. CT head with ischemic changes in R-MCA territory affecting frontal cortex, anterior coronal radiata and dorsal striatum.  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 MBS done revealing moderate oral and mild pharyngeal dysphagia and patient placed on Dys. 2, nectar-thick liquids diet; however, patient downgraded to Dys.1 textures at bedside 11/27 due to difficulty observed. Therapy evaluations done 11/25 and patient limited by dense L- hemiplegia, poor truncal control with impaired poor safety awareness. [facility] recommended by rehab team and MD for follow up therapy; patient admitted 10/18/14.   Orders received; bedside swallow and cognitive-linguistic evaluations completed. Patient demonstrates  left sided oral weakness that results in mild dysarthria and moderate oral dysphagia.  Per previous objective study patient also demonstrates a mild pharyngeal phase dysphagia.  Cognitive-linguistic deficits are characterized by impaired emergent awareness, basic problem solving, left inattention and poor safety awareness.  Patient also demonstrates impulsivity and perseveration.  Patient requires skilled SLP services to address deficits, maximize functional independence and decrease caregiver burden.  Anticipate patient will required 24/7 supervision following discharge.      Skilled Therapeutic Interventions          Bedside and cognitive-linguistic evaluation completed with results and recommendations reviewed with patient; no family present.    SLP Assessment  Patient will need skilled Speech Lanaguage Pathology Services during CIR admission    Recommendations  Diet Recommendations: Dysphagia 1 (Puree);Nectar-thick liquid Liquid Administration via: Cup;Straw Medication Administration: Whole meds with puree Supervision: Patient able to self feed;Full supervision/cueing for compensatory strategies Compensations: Small sips/bites;Slow rate;Check for pocketing;Check for anterior loss Postural Changes and/or Swallow Maneuvers: Seated upright 90 degrees Oral Care Recommendations: Oral care BID Patient destination:  (TBD) Follow up Recommendations: 24 hour supervision/assistance;Home Health SLP;Outpatient SLP;Skilled Nursing facility Equipment Recommended: None recommended by SLP    SLP Frequency 5 out of 7 days   SLP Treatment/Interventions Cognitive remediation/compensation;Cueing hierarchy;Dysphagia/aspiration precaution training;Functional tasks;Environmental controls;Internal/external aids;Medication managment;Patient/family education;Speech/Language facilitation;Therapeutic Activities    Pain Pain Assessment Pain Assessment: No/denies pain Prior Functioning Cognitive/Linguistic Baseline:  Information not available Type of Home: House  Lives With: Significant other Available Help at Discharge: Available PRN/intermittently;Other (Comment) (significant other) Education: HS per pt  Short Term Goals: Week 1: SLP Short Term Goal 1 (Week 1): Patient will recall and utilize recommended safe swallow strategies with Dys. 1 textures and nectar-thick liquids with Mod vebral cues to minimize overt s/s  of aspiration.  SLP Short Term Goal 2 (Week 1): Patient will demonstrate effecient mastication of Dys. 2 textures with Mod verbal cues for 3 consecutive sessions to demonstrate readiness for texture advancement. SLP Short Term Goal 3 (Week 1): Patient will attend to left of environment with Mod question cues. SLP Short Term Goal 4 (Week 1): Patient will demonstrate sustained attention to a basic task for 10 minutes with Mod verbal cues for redirection. SLP Short Term Goal 5 (Week 1): Patient will request help as needed to complete basic problem solving tasks with Mod question cues.   See FIM for current functional status Refer to Care Plan for Long Term Goals  Recommendations for other services: None  Discharge Criteria: Patient will be discharged from SLP if patient refuses treatment 3 consecutive times without medical reason, if treatment goals not met, if there is a change in medical status, if patient makes no progress towards goals or if patient is discharged from hospital.  The above assessment, treatment plan, treatment alternatives and goals were discussed and mutually agreed upon: by patient  Gunnar Fusi, M.A., CCC-SLP [phone]  [place] 10/19/2014, 4:43 PM

## 2014-10-19 NOTE — Progress Notes (Signed)
[place] PHYSICAL MEDICINE & REHABILITATION     PROGRESS NOTE    Subjective/Complaints: Up with OT. Complains of H/A this morning. Doesn't offer much else ROS limited due to mental status  Objective: Vital Signs: Blood pressure 127/84, pulse 71, temperature 98 F (36.7 C), temperature source Oral, resp. rate 18, SpO2 99 %. No results found. No results for input(s): WBC, HGB, HCT, PLT in the last 72 hours.  Recent Labs  10/16/14 1153 10/18/14 0410  NA 137 139  K 4.0 4.1  CL 100 101  GLUCOSE 89 102*  BUN 16 17  CREATININE 1.10 0.95  CALCIUM 9.2 9.8   CBG (last 3)  No results for input(s): GLUCAP in the last 72 hours.  Wt Readings from Last 3 Encounters:  10/15/14 79.289 kg (174 lb 12.8 oz)  09/03/14 86.183 kg (190 lb)    Physical Exam:  Constitutional: He is oriented to person, place, and time. He appears well-developed and well-nourished.  HENT:  Head: Normocephalic and atraumatic.  Eyes: Conjunctivae are normal. Pupils are equal, round, and reactive to light.  Left eye lid with some drainage  Neck: Normal range of motion. Neck supple.  Cardiovascular: Normal rate and regular rhythm.  Respiratory: Effort normal and breath sounds normal. No respiratory distress. He has no wheezes.  GI: Soft. Bowel sounds are normal.  Musculoskeletal: He exhibits no edema or tenderness.  Neurological: He is alert and oriented to person, place, and time. Delayed processing.  Left facial weakness with mild dysarthria and left inattention.   Has poor insight and awareness of deficits and easily distracted and impulsive. Motor strength 0/5 in left deltoid, biceps, triceps, grip, hip flexor, trace knee extensor, 0 ankle dorsiflexion plantar flexion 5/5 on the right side in the same muscle groups.  Absent sensation in the left upper and left lower extremity. Poor sitting balance. Visual fields absent to confrontation testing on left side  Skin: Skin is warm and dry.  Psychiatric: His  affect is blunt. His speech is slurred. He is withdrawn. He expresses impulsivity.   Assessment/Plan: 1. Functional deficits secondary to right MCA infarct, ?thrombotic, which require 3+ hours per day of interdisciplinary therapy in a comprehensive inpatient rehab setting. Physiatrist is providing close team supervision and 24 hour management of active medical problems listed below. Physiatrist and rehab team continue to assess barriers to discharge/monitor patient progress toward functional and medical goals. FIM:          FIM - ArchivistToilet Transfers Toilet Transfers: 0-Activity did not occur  FIM - Games developerBed/Chair Transfer Bed/Chair Transfer: 0: Activity did not occur     Comprehension Comprehension Mode: Auditory Comprehension: 5-Understands basic 90% of the time/requires cueing < 10% of the time  Expression Expression Mode: Verbal Expression: 5-Expresses basic 90% of the time/requires cueing < 10% of the time.  Social Interaction Social Interaction: 5-Interacts appropriately 90% of the time - Needs monitoring or encouragement for participation or interaction.  Problem Solving Problem Solving: 4-Solves basic 75 - 89% of the time/requires cueing 10 - 24% of the time  Memory Memory: 4-Recognizes or recalls 75 - 89% of the time/requires cueing 10 - 24% of the time  Medical Problem List and Plan: 1. Functional deficits secondary to R-MCA infarct question embolic v/s thrombotic with resultant left hemiplegia with sensory deficits, left facial weakness, left neglect and dysphagia. 2. DVT Prophylaxis/Anticoagulation: Pharmaceutical: Lovenox 3. Pain Management: tylenol for h/a for now. Can ultram for more severe pain if necessary 4. Mood: Seems withdrawn--will monitor for  now. LCSW to follow for support and evaluation.  5. Neuropsych: This patient is capable of making decisions on his own behalf. 6. Skin/Wound Care: Routine pressure relief measures.  7.  Fluids/Electrolytes/Nutrition: Monitor I/O. Follow up admission labs  8. HTN: 9. H/o Alcohol abuse/Tobacco use: Will add nicotine patch as asking for his snuff. CIWA protocol. Continue folic acid and thiamine.  10. Dyslipidemia: LOS (Days) 1 A FACE TO FACE EVALUATION WAS PERFORMED  Rambo Sarafian T 10/19/2014 7:56 AM

## 2014-10-19 NOTE — Plan of Care (Signed)
Problem: RH SAFETY Goal: RH STG ADHERE TO SAFETY PRECAUTIONS W/ASSISTANCE/DEVICE STG Adhere to Safety Precautions With min Assistance/Device.  Outcome: Progressing Goal: RH STG DECREASED RISK OF FALL WITH ASSISTANCE STG Decreased Risk of Fall With min Assistance.  Outcome: Progressing  Problem: RH COGNITION-NURSING Goal: RH STG ANTICIPATES NEEDS/CALLS FOR ASSIST W/ASSIST/CUES STG Anticipates Needs/Calls for Assist With min Assistance/Cues.  Outcome: Progressing  Problem: RH PAIN MANAGEMENT Goal: RH STG PAIN MANAGED AT OR BELOW PT'S PAIN GOAL Less than 2  Outcome: Progressing

## 2014-10-20 NOTE — Progress Notes (Signed)
[place] PHYSICAL MEDICINE & REHABILITATION     PROGRESS NOTE    Subjective/Complaints: No specific complaints. Wants breakfast ROS limited due to mental status/affect  Objective: Vital Signs: Blood pressure 112/74, pulse 80, temperature 98.3 F (36.8 C), temperature source Oral, resp. rate 18, SpO2 100 %. No results found. No results for input(s): WBC, HGB, HCT, PLT in the last 72 hours.  Recent Labs  10/18/14 0410  NA 139  K 4.1  CL 101  GLUCOSE 102*  BUN 17  CREATININE 0.95  CALCIUM 9.8   CBG (last 3)  No results for input(s): GLUCAP in the last 72 hours.  Wt Readings from Last 3 Encounters:  10/15/14 79.289 kg (174 lb 12.8 oz)  09/03/14 86.183 kg (190 lb)    Physical Exam:  Constitutional: He is oriented to person, place, and time. He appears well-developed and well-nourished.  HENT:  Head: Normocephalic and atraumatic.  Eyes: Conjunctivae are normal. Pupils are equal, round, and reactive to light.  Left eye lid with some drainage  Neck: Normal range of motion. Neck supple.  Cardiovascular: Normal rate and regular rhythm.  Respiratory: Effort normal and breath sounds normal. No respiratory distress. He has no wheezes.  GI: Soft. Bowel sounds are normal.  Musculoskeletal: He exhibits no edema or tenderness.  Neurological: He is alert and oriented to person, place, and time. Delayed processing.  Left facial weakness with mild dysarthria and left inattention.   Has poor insight and awareness of deficits and easily distracted and impulsive. Motor strength 0/5 in left deltoid, biceps, triceps, ?tr grip, hip flexor, trace knee extensor, 0 ankle dorsiflexion plantar flexion 5/5 on the right side in the same muscle groups.  Absent sensation in the left upper and left lower extremity. Poor sitting balance. Visual fields absent to confrontation testing on left side  Skin: Skin is warm and dry.  Psychiatric: His affect is blunt. His speech is slurred. He is withdrawn.  He expresses impulsivity.   Assessment/Plan: 1. Functional deficits secondary to right MCA infarct, ?thrombotic, which require 3+ hours per day of interdisciplinary therapy in a comprehensive inpatient rehab setting. Physiatrist is providing close team supervision and 24 hour management of active medical problems listed below. Physiatrist and rehab team continue to assess barriers to discharge/monitor patient progress toward functional and medical goals. FIM: FIM - Bathing Bathing Steps Patient Completed: Chest, Abdomen, Front perineal area, Right upper leg, Left upper leg Bathing: 3: Mod-Patient completes 5-7 181f 10 parts or 50-74%  FIM - Upper Body Dressing/Undressing Upper body dressing/undressing steps patient completed: Thread/unthread right sleeve of pullover shirt/dresss Upper body dressing/undressing: 2: Max-Patient completed 25-49% of tasks FIM - Lower Body Dressing/Undressing Lower body dressing/undressing: 1: Total-Patient completed less than 25% of tasks     FIM - ArchivistToilet Transfers Toilet Transfers: 0-Activity did not occur  FIM - BankerBed/Chair Transfer Bed/Chair Transfer Assistive Devices: Arm rests, Bed rails, HOB elevated Bed/Chair Transfer: 3: Supine > Sit: Mod A (lifting assist/Pt. 50-74%/lift 2 legs, 2: Chair or W/C > Bed: Max A (lift and lower assist), 2: Bed > Chair or W/C: Max A (lift and lower assist)  FIM - Locomotion: Wheelchair Distance: 75 Locomotion: Wheelchair: 2: Travels 50 - 149 ft with maximal assistance (Pt: 25 - 49%) FIM - Locomotion: Ambulation Locomotion: Ambulation Assistive Devices: Other (comment), Orthosis (R handrail; L DF ace wrap) Ambulation/Gait Assistance: 1: +2 Total assist, 2: Max assist (maxA gait training; +2 for w/c follow) Locomotion: Ambulation: 1: Two helpers  Comprehension Comprehension Mode: Auditory  Comprehension: 3-Understands basic 50 - 74% of the time/requires cueing 25 - 50%  of the time  Expression Expression Mode:  Verbal Expression: 4-Expresses basic 75 - 89% of the time/requires cueing 10 - 24% of the time. Needs helper to occlude trach/needs to repeat words.  Social Interaction Social Interaction: 2-Interacts appropriately 25 - 49% of time - Needs frequent redirection.  Problem Solving Problem Solving: 2-Solves basic 25 - 49% of the time - needs direction more than half the time to initiate, plan or complete simple activities  Memory Memory: 3-Recognizes or recalls 50 - 74% of the time/requires cueing 25 - 49% of the time  Medical Problem List and Plan: 1. Functional deficits secondary to R-MCA infarct question embolic v/s thrombotic with resultant left hemiplegia with sensory deficits, left facial weakness, left neglect and dysphagia. 2. DVT Prophylaxis/Anticoagulation: Pharmaceutical: Lovenox 3. Pain Management: tylenol for h/a for now. Can ultram for more severe pain if necessary 4. Mood: Seems withdrawn--will monitor for now. LCSW to follow for support and evaluation.  5. Neuropsych: This patient is capable of making decisions on his own behalf. 6. Skin/Wound Care: Routine pressure relief measures.  7. Fluids/Electrolytes/Nutrition: Monitor I/O. Follow up admission labs tomorrow 8. HTN: 9. H/o Alcohol abuse/Tobacco use: Will add nicotine patch as asking for his snuff. CIWA protocol. Continue folic acid and thiamine.  10. Dyslipidemia: LOS (Days) 2 A FACE TO FACE EVALUATION WAS PERFORMED  Cory [name] T 10/20/2014 7:49 AM

## 2014-10-21 ENCOUNTER — Inpatient Hospital Stay (HOSPITAL_COMMUNITY): Payer: Self-pay | Admitting: Rehabilitation

## 2014-10-21 ENCOUNTER — Inpatient Hospital Stay (HOSPITAL_COMMUNITY): Payer: Medicaid Other | Admitting: Occupational Therapy

## 2014-10-21 ENCOUNTER — Encounter (HOSPITAL_COMMUNITY): Payer: Self-pay | Admitting: Cardiology

## 2014-10-21 ENCOUNTER — Inpatient Hospital Stay (HOSPITAL_COMMUNITY): Payer: Self-pay | Admitting: Speech Pathology

## 2014-10-21 LAB — COMPREHENSIVE METABOLIC PANEL
ALT: 10 U/L (ref 0–53)
ANION GAP: 14 (ref 5–15)
AST: 25 U/L (ref 0–37)
Albumin: 3.3 g/dL — ABNORMAL LOW (ref 3.5–5.2)
Alkaline Phosphatase: 52 U/L (ref 39–117)
BILIRUBIN TOTAL: 0.5 mg/dL (ref 0.3–1.2)
BUN: 21 mg/dL (ref 6–23)
CHLORIDE: 101 meq/L (ref 96–112)
CO2: 21 meq/L (ref 19–32)
CREATININE: 0.85 mg/dL (ref 0.50–1.35)
Calcium: 9.3 mg/dL (ref 8.4–10.5)
GLUCOSE: 93 mg/dL (ref 70–99)
Potassium: 5.4 mEq/L — ABNORMAL HIGH (ref 3.7–5.3)
Sodium: 136 mEq/L — ABNORMAL LOW (ref 137–147)
Total Protein: 7.8 g/dL (ref 6.0–8.3)

## 2014-10-21 LAB — CBC WITH DIFFERENTIAL/PLATELET
BASOS PCT: 0 % (ref 0–1)
Basophils Absolute: 0 10*3/uL (ref 0.0–0.1)
EOS ABS: 0.2 10*3/uL (ref 0.0–0.7)
Eosinophils Relative: 3 % (ref 0–5)
HCT: 46.8 % (ref 39.0–52.0)
Hemoglobin: 15.1 g/dL (ref 13.0–17.0)
Lymphocytes Relative: 27 % (ref 12–46)
Lymphs Abs: 1.8 10*3/uL (ref 0.7–4.0)
MCH: 28.4 pg (ref 26.0–34.0)
MCHC: 32.3 g/dL (ref 30.0–36.0)
MCV: 88.1 fL (ref 78.0–100.0)
MONOS PCT: 10 % (ref 3–12)
Monocytes Absolute: 0.7 10*3/uL (ref 0.1–1.0)
Neutro Abs: 4 10*3/uL (ref 1.7–7.7)
Neutrophils Relative %: 60 % (ref 43–77)
Platelets: ADEQUATE 10*3/uL (ref 150–400)
RBC: 5.31 MIL/uL (ref 4.22–5.81)
RDW: 12.4 % (ref 11.5–15.5)
WBC: 6.7 10*3/uL (ref 4.0–10.5)

## 2014-10-21 MED ORDER — TOPIRAMATE 25 MG PO TABS
25.0000 mg | ORAL_TABLET | Freq: Two times a day (BID) | ORAL | Status: DC
  Administered 2014-10-21 (×2): 25 mg via ORAL
  Filled 2014-10-21 (×5): qty 1

## 2014-10-21 NOTE — Progress Notes (Signed)
Patient information reviewed and entered into [facility] system by Latrisa Hellums, RN, CRRN, PPS Coordinator.  Information including medical coding and functional independence measure will be reviewed and updated through discharge.    

## 2014-10-21 NOTE — Progress Notes (Signed)
[place] PHYSICAL MEDICINE & REHABILITATION     PROGRESS NOTE    Subjective/Complaints: Headache, fairly constant R temporal area, no radiation, no other c/os ROS limited due to mental status/affect  Objective: Vital Signs: Blood pressure 117/71, pulse 69, temperature 98.7 F (37.1 C), temperature source Oral, resp. rate 18, SpO2 97 %. No results found.  Recent Labs  10/21/14 0303  WBC 6.7  HGB 15.1  HCT 46.8  PLT PLATELET CLUMPS NOTED ON SMEAR, COUNT APPEARS ADEQUATE    Recent Labs  10/21/14 0303  NA 136*  K 5.4*  CL 101  GLUCOSE 93  BUN 21  CREATININE 0.85  CALCIUM 9.3   CBG (last 3)  No results for input(s): GLUCAP in the last 72 hours.  Wt Readings from Last 3 Encounters:  10/15/14 79.289 kg (174 lb 12.8 oz)  09/03/14 86.183 kg (190 lb)    Physical Exam:  Constitutional: He is oriented to person, place, and time. He appears well-developed and well-nourished.  HENT:  Head: Normocephalic and atraumatic.  Eyes: Conjunctivae are normal. Pupils are equal, round, and reactive to light.  Left eye lid with some drainage  Neck: Normal range of motion. Neck supple.  Cardiovascular: Normal rate and regular rhythm.  Respiratory: Effort normal and breath sounds normal. No respiratory distress. He has no wheezes.  GI: Soft. Bowel sounds are normal.  Musculoskeletal: He exhibits no edema or tenderness.  Neurological: He is alert and oriented to person, place, and time. Delayed processing.  Left facial weakness with mild dysarthria and left inattention.   Has poor insight and awareness of deficits and easily distracted and impulsive. Motor strength 0/5 in left deltoid, biceps, triceps, ?tr grip, hip flexor, trace knee extensor, 0 ankle dorsiflexion plantar flexion 5/5 on the right side in the same muscle groups.  Absent sensation in the left upper and left lower extremity. Poor sitting balance. Visual fields absent to confrontation testing on left side  Skin: Skin  is warm and dry.  Psychiatric: His affect is blunt. His speech is slurred. He is withdrawn. He expresses impulsivity.   Assessment/Plan: 1. Functional deficits secondary to right MCA infarct, ?thrombotic, which require 3+ hours per day of interdisciplinary therapy in a comprehensive inpatient rehab setting. Physiatrist is providing close team supervision and 24 hour management of active medical problems listed below. Physiatrist and rehab team continue to assess barriers to discharge/monitor patient progress toward functional and medical goals. FIM: FIM - Bathing Bathing Steps Patient Completed: Chest, Abdomen, Front perineal area Bathing: 2: Max-Patient completes 3-4 6853f 10 parts or 25-49%  FIM - Upper Body Dressing/Undressing Upper body dressing/undressing steps patient completed: Pull shirt over trunk Upper body dressing/undressing: 1: Total-Patient completed less than 25% of tasks FIM - Lower Body Dressing/Undressing Lower body dressing/undressing: 1: Total-Patient completed less than 25% of tasks     FIM - ArchivistToilet Transfers Toilet Transfers: 0-Activity did not occur  FIM - BankerBed/Chair Transfer Bed/Chair Transfer Assistive Devices: Bed rails Bed/Chair Transfer: 2: Bed > Chair or W/C: Max A (lift and lower assist)  FIM - Locomotion: Wheelchair Distance: 75 Locomotion: Wheelchair: 2: Travels 50 - 149 ft with maximal assistance (Pt: 25 - 49%) FIM - Locomotion: Ambulation Locomotion: Ambulation Assistive Devices: Other (comment), Orthosis (R handrail; L DF ace wrap) Ambulation/Gait Assistance: 1: +2 Total assist, 2: Max assist (maxA gait training; +2 for w/c follow) Locomotion: Ambulation: 1: Two helpers  Comprehension Comprehension Mode: Auditory Comprehension: 3-Understands basic 50 - 74% of the time/requires cueing 25 - 50%  of the time  Expression Expression Mode: Verbal Expression: 4-Expresses basic 75 - 89% of the time/requires cueing 10 - 24% of the time. Needs helper to  occlude trach/needs to repeat words.  Social Interaction Social Interaction: 2-Interacts appropriately 25 - 49% of time - Needs frequent redirection.  Problem Solving Problem Solving: 2-Solves basic 25 - 49% of the time - needs direction more than half the time to initiate, plan or complete simple activities  Memory Memory: 1-Recognizes or recalls less than 25% of the time/requires cueing greater than 75% of the time  Medical Problem List and Plan: 1. Functional deficits secondary to R-MCA infarct question embolic v/s thrombotic with resultant left hemiplegia with sensory deficits, left facial weakness, left neglect and dysphagia. 2. DVT Prophylaxis/Anticoagulation: Pharmaceutical: Lovenox 3. Pain Management: tylenol for h/a for now. Trial topamax for post stroke headache 4. Mood: Seems withdrawn--will monitor for now. LCSW to follow for support and evaluation.  5. Neuropsych: This patient is capable of making decisions on his own behalf. 6. Skin/Wound Care: Routine pressure relief measures.  7. Fluids/Electrolytes/Nutrition: Monitor I/O. Follow up admission labs tomorrow 8. HTN: 9. H/o Alcohol abuse/Tobacco use: Will add nicotine patch as asking for his snuff. CIWA protocol. Continue folic acid and thiamine.  10. Dyslipidemia: 11.  Mild HyperK, ? Hemolysis , no obvious medication source, will recheck in am  LOS (Days) 3 A FACE TO FACE EVALUATION WAS PERFORMED  KIRSTEINS,ANDREW E 10/21/2014 7:26 AM

## 2014-10-21 NOTE — Progress Notes (Signed)
Physical Therapy Session Note  Patient Details  Name: Cory [name] MRN: [id number] Date of Birth: 1961-10-03  Today's Date: 10/21/2014 PT Individual Time: 1400-1500 PT Individual Time Calculation (min): 60 min   Short Term Goals: Week 1:  PT Short Term Goal 1 (Week 1): Patient will perform bed mobility from flat surface with modA. PT Short Term Goal 2 (Week 1): Patient will perform functional transfers with modA. PT Short Term Goal 3 (Week 1): Patient will perform gait training 2925' with LRAD and +2 assist. PT Short Term Goal 4 (Week 1): Patient will negotiate 3 steps with R handrail and maxA. PT Short Term Goal 5 (Week 1): Patient will demonstrate carry over with mobility techniques within session with mod cues.  Skilled Therapeutic Interventions/Progress Updates:   Pt received lying in bed, on L side, agreeable to therapy.  Educated on importance of correct positioning while in bed for safety of LUE.  Pt performed bed mobility with mod A, however requires max to total A cues for rolling in order to increase ease of movement.  Once at EOB, note that pt smelled of urine and was noticeably wet.  Pt states that he already used restroom and pointed to urinal.  Pt then states he may have spilled it on him, but wasn't sure.  Assisted pt into w/c and over to sink to stand and wash up.   Requires mod A for squat pivot transfers with max A cues for safety and technique.  Pt very impulsive during session and requires constant cues to wait for therapist before getting up, as well as ensuring brakes were locked at all times.  Performed standing x 2 reps in order to assist with cleaning.  Pt with heavy L lateral lean with increased L hamstring tone.  Pt very unsafe and grabbing for furniture, however with max cues, was able to stand and assist himself with cleaning peri area.  Assisted with donning shorts and old shirt (did not have clean one, but was dry, therefore donned it).  Pt self propelled to therapy  gym x 50' using R hemi technique at mod A level with MAX verbal cues for technique and attending to the L.  Assisted remainder of distance to therapy gym in order to assess gait on R handrail.  Requires max A to advance LLE (did note some L hip flex, however due to increased tone, requires assist for placement during all steps), assist for upright posture, assist at L knee for stabilization during L stance and max verbal cues for safety and waiting for therapist before stepping RLE.  Performed 25' x 1 with max A and +2 for chair follow for safety.  Switched pts w/c to 16x16 for better positioning and ease of w/c propulsion during session.  Pt states back feels better in this chair.  Remainder of session focused on standing balance and NMR through LLE.  Performed several bouts of standing reaching to the L in order to increase WB through LLE.  Was difficult to reproduce tone in LLE during sitting, but do note in standing with active movement.  Pt with very quick movements and requires max cues for safety.  Pt with limited endurance during session but provided cues for encouragement and to continue to push through to increase endurance.  Ended session with tall kneeling activity while performing mini squats and advancing/retro stepping RLE to increase WB through LLE.  Tolerated well and states "I feel like I worked hard."  Pt assisted back to room and  left in w/c.  QRB donned and all needs in reach.   Therapy Documentation Precautions:  Precautions Precautions: Fall Precaution Comments: L inattention, brace on L ankle from recent fx Restrictions Weight Bearing Restrictions: No   Vital Signs: Therapy Vitals Temp: 98.5 F (36.9 C) Temp Source: Oral Pulse Rate: 84 Resp: 17 BP: 122/77 mmHg Patient Position (if appropriate): Lying Oxygen Therapy SpO2: 99 % O2 Device: Not Delivered Pain: Pt with no reports of pain during session.    Locomotion : Ambulation Ambulation/Gait Assistance: 2: Max  assist;1: +2 Total assist (+2 for chair follow) Wheelchair Mobility Distance: 75   See FIM for current functional status  Therapy/Group: Individual Therapy  [name], Golden Gilreath Ann 10/21/2014, 4:32 PM

## 2014-10-21 NOTE — Progress Notes (Signed)
Social Work Patient ID: Cory [name], male   DOB: [birth year], 53 y.o.   MRN: [id number] Messages left for sister and girlfriend to complete assessment and begin the discussion of discharge plan.

## 2014-10-21 NOTE — IPOC Note (Addendum)
Overall Plan of Care [facility](IPOC) Patient Details Name: Cory [name] MRN: [id number] DOB: 1961/02/04  Admitting Diagnosis: R MCA INFARCT  Hospital Problems: Active Problems:   CVA (cerebral infarction)   Right middle cerebral artery stroke     Functional Problem List: Nursing Bladder, Bowel, Endurance, Medication Management, Motor, Nutrition, Pain, Safety  PT Balance, Endurance, Motor, Perception, Safety, Sensory  OT Balance, Sensory, Vision, Cognition, Endurance, Motor, Perception, Safety  SLP Cognition, Linguistic, Nutrition  TR Activity tolerance, functional mobility, balance, cognition, safety       Basic ADL's: OT Eating, Grooming, Bathing, Dressing, Toileting     Advanced  ADL's: OT  (n/a)     Transfers: PT Bed Mobility, Bed to Chair, Car, Occupational psychologisturniture  OT Toilet, Research scientist (life sciences)Tub/Shower     Locomotion: PT Ambulation, Psychologist, prison and probation servicesWheelchair Mobility, Stairs     Additional Impairments: OT Fuctional Use of Upper Extremity  SLP Swallowing, Communication, Social Cognition expression Social Interaction, Problem Solving, Attention, Awareness  TR      Anticipated Outcomes Item Anticipated Outcome  Self Feeding set up A  Swallowing  Min assist    Basic self-care  supervision - Min A   Toileting  supervision   Bathroom Transfers Supervision   Bowel/Bladder  mod I   Transfers  supervision  Locomotion  minA gait, supervision wheelchair mobility  Communication  Supervision   Cognition  Min assist   Pain  less than 2  Safety/Judgment  min assist    Therapy Plan: PT Intensity: Minimum of 1-2 x/day ,45 to 90 minutes PT Frequency: 5 out of 7 days PT Duration Estimated Length of Stay: 22-25 days OT Intensity: Minimum of 1-2 x/day, 45 to 90 minutes OT Frequency: 5 out of 7 days OT Duration/Estimated Length of Stay: 21-24 days SLP Intensity: Minumum of 1-2 x/day, 30 to 90 minutes SLP Frequency: 5 out of 7 days SLP Duration/Estimated Length of Stay: 21-24  TR Duration/ELOS:  2.5  weeks TR Frequency:  Min 1 time per week >20 minutes       Team Interventions: Nursing Interventions Patient/Family Education, Bladder Management, Bowel Management, Disease Management/Prevention, Pain Management, Medication Management, Dysphagia/Aspiration Precaution Training, Discharge Planning, Psychosocial Support  PT interventions Ambulation/gait training, Disease management/prevention, Pain management, Stair training, Visual/perceptual remediation/compensation, Wheelchair propulsion/positioning, Therapeutic Activities, Patient/family education, DME/adaptive equipment instruction, Warden/rangerBalance/vestibular training, Cognitive remediation/compensation, Psychosocial support, Therapeutic Exercise, UE/LE Strength taining/ROM, Functional mobility training, Community reintegration, Discharge planning, Neuromuscular re-education, Splinting/orthotics, UE/LE Coordination activities  OT Interventions Warden/rangerBalance/vestibular training, Discharge planning, Self Care/advanced ADL retraining, Therapeutic Activities, Cognitive remediation/compensation, UE/LE Coordination activities, Functional mobility training, Patient/family education, Therapeutic Exercise, Visual/perceptual remediation/compensation, FirefighterCommunity reintegration, Fish farm managerDME/adaptive equipment instruction, Neuromuscular re-education, Psychosocial support, UE/LE Strength taining/ROM, Wheelchair propulsion/positioning  SLP Interventions Cognitive remediation/compensation, Financial traderCueing hierarchy, Dysphagia/aspiration precaution training, Functional tasks, Environmental controls, Internal/external aids, Medication managment, Patient/family education, Speech/Language facilitation, Therapeutic Activities  TR Interventions Recreation/leisure participation, Balance/Vestibular training, functional mobility, therapeutic activities, UE/LE strength/coordination, cognitive retraining/compensation, w/c mobility, community reintegration, pt/family education, adaptive equipment instruction/use,  discharge planning, psychosocial support  SW/CM Interventions      Team Discharge Planning: Destination: PT-Home (home vs. SNF; unsure if patient with have 24/'7 caregiver) ,OT- Skilled Nursing Facility (SNF) (pending availability of assistance at home) , SLP- (TBD) Projected Follow-up: PT-Home health PT, Other (comment), 24 hour supervision/assistance, Skilled nursing facility (HHPT vs. SNF, unsure if patient will have 24/'7 caregiver), OT-  Home health OT, Skilled nursing facility, SLP-24 hour supervision/assistance, Home Health SLP, Outpatient SLP, Skilled Nursing facility Projected Equipment Needs: PT-To be determined, OT- To be determined, SLP-None recommended  by SLP Equipment Details: PT-Patient owns crutches, but no other DME; recommendations TBD upon discharge., OT-  Patient/family involved in discharge planning: PT- Patient, Other (Comment) (unsure what patient retained due to lethargy and cognitive status),  OT-Patient, SLP-Patient  MD ELOS: 18-23d Medical Rehab Prognosis:  Good Assessment: 53 y.o. RH- male with history of HTN, Hyperlipidemia, heavy alcohol use, noncompliance with meds; who was admitted on 10/15/14 with left facial droop, slurred speech, left sided weakness and somnolence. CT head with ischemic changes in R-MCA territory affecting frontal cortex, anterior coronal radiata and dorsal striatum. 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 Carotid dopplers limited due to poor cooperation--R-ICA with 1-30% ICA stenosis. TEE done revealing EF 55-65% with no PFO and increased RA pressure. Hypercoagulopathy panel pending. MBS done revealing moderate oral and mild pharyngeal dysphagia and patient placed on dysphagia 2, nectar liquids. Neurology recommending ASA for secondary stroke prevention. Therapy evaluations done 11/25 and patient limited by dense L- hemiplegia, poor truncal  control with impaired poor safety awareness   Now requiring 24/7 Rehab RN,MD, as well as CIR level PT, OT and SLP.  Treatment team will focus on ADLs and mobility with goals set at sup/minA  See Team Conference Notes for weekly updates to the plan of care

## 2014-10-21 NOTE — Progress Notes (Signed)
Social Work Assessment and Plan Social Work Assessment and Plan  Patient Details  Name: Cory [name] MRN: [id number] Date of Birth: 1961-01-25  Today's Date: 10/21/2014  Problem List:  Patient Active Problem List   Diagnosis Date Noted  . CVA (cerebral infarction) 10/18/2014  . Right middle cerebral artery stroke   . Dyslipidemia   . Acute CVA (cerebrovascular accident) 10/15/2014  . Stroke 10/15/2014  . Alcoholism 10/15/2014   Past Medical History:  Past Medical History  Diagnosis Date  . Medical history non-contributory    Past Surgical History:  Past Surgical History  Procedure Laterality Date  . No past surgeries    . Tee without cardioversion N/A 10/16/2014    Procedure: TRANSESOPHAGEAL ECHOCARDIOGRAM (TEE);  Surgeon: Donato SchultzMark Skains, MD;  [facility];  Service: Cardiovascular;  Laterality: N/A;   Social History:  reports that he has quit smoking. He does not have any smokeless tobacco history on file. He reports that he drinks alcohol. He reports that he does not use illicit drugs.  Family / Support Systems Marital Status: Single Patient Roles: Partner, Parent, Other (Comment) (employee) Spouse/Significant Other: Lynette Campbell-girlfriend  [phone] Children: Tanna Furryyisha Vaughn-daughter  [phone] Other Supports: Audree CamelGenetha Johnson-sister  [phone] Anticipated Caregiver: unsure at this time Ability/Limitations of Caregiver: All work and are trying to come up with 24 hr care.  Pt reports sister is self employed and can make her own hours Caregiver Availability: Other (Comment) (All family members work-there is no 24 hr care at this time) Family Dynamics: Pt recently moved here 2 years ago with his girlfriend, his son's are in [place], daughter [place] in [place] and sister [place] in [place].  Pt's daughter questions girlfreind's intentions and unsure if his sister will take care of him.  All need to iron out plan for pt, all he wants to do is  leave here.  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   Abuse/Neglect Physical Abuse: Denies Verbal Abuse: Denies Sexual Abuse: Denies Exploitation of patient/patient's resources: Denies Self-Neglect: Denies  Emotional Status Pt's affect, behavior adn adjustment status: Pt has no awareness of reason he is here, he talks about going home and getting out of here.  He has always done what he wanted and why would that stop now.  His family reports he has not always made the best decisions for himself.  Daughter reports: " He is stubborn." Recent Psychosocial Issues: recent fractured ankle and other health issues Pyschiatric History: No issues-deferred depression screen due to pt tired from therapies and wanting to lay down.  Would benefit from Neuro-psych to see while here with his substance abuse issues Substance Abuse History: ETOH-reports: " I drink quite bit much."  He does not understand the parallel between his drinking and his stroke.  He feels he can manage at home.  He quit tobacco but continues to dip snuff.  Will continue to discuss this issues throughout his stay  Patient / Family Perceptions, Expectations & Goals Pt/Family understanding of illness & functional limitations: Pt has very little awareness and understanding of his stroke, his girlfriend and daughter have a basic understanding and want to help him  and will do the best they can.  Pt's mother and sister have had strokes so it runs in his family.  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  Community Resources Levi StraussCommunity Agencies: None Premorbid Home Care/DME Agencies: None Transportation available at discharge: Friend and girlfriend Resource referrals recommended: Support group (specify), Neuropsychology  Discharge Planning Living Arrangements: Spouse/significant other Support Systems: Spouse/significant other, Children, Other relatives, Friends/neighbors Type of Residence: Private residence Insurance Resources: Customer service managerelf-pay Financial Resources: Employment, Other (Comment) Financial Screen Referred: Yes Living Expenses: Psychologist, sport and exerciseent Money Management: Patient, Significant Other Does the patient have any problems obtaining your medications?: Yes (Describe) (No PCP and no insurance) Home Management: Girlfriend Patient/Family Preliminary Plans: Unsure at this time, no one availabel to provide 24 hr care, all work.  Encouraged them to discuss a realisitc plan for pt at discharge.  Will have him apoply for disability and medicaid and begin the process, which takes months.  Informed daughter of this, she was not aware.  Pt may not be agreeable to the plan they come up with either. Social Work Anticipated Follow Up Needs: HH/OP, SNF, Support Group  Clinical Impression Pt is motivated to get out of the hospital and be home, he is not aware of his deficits.  He continues to contact girlfriend and asks her to come and take him home. His daughter and sister are working on a plan. But feel he can go to a NH  without insurance, which is not true. Girlfriend, sister and daughter need to get together and discuss a realistic plan and include pt so he is agreeable. Will have him apply for disability and Medicaid and begin the process. Pt will require care at discharge all have been made aware of this.  Work on a plan and include pt.  Lucy Chrisupree, Thaxton Pelley G 10/21/2014, 3:27 PM

## 2014-10-21 NOTE — Progress Notes (Signed)
Complained of headache, PRN tylenol given at 2102. 2 attempts to get OOB without assistance during night. Bed alarm in place to alert staff. Loosened brace on left ankle, per patient's request. Leaks out left side of mouth with PO intake. Cory MartinezMurray, Phelix Fudala A

## 2014-10-21 NOTE — Progress Notes (Signed)
Occupational Therapy Session Note  Patient Details  Name: Cory [name] MRN: [id number] Date of Birth: 12/09/1960  Today's Date: 10/21/2014 OT Individual Time: 0900-1000 OT Individual Time Calculation (min): 60 min    Short Term Goals: Week 1:  OT Short Term Goal 1 (Week 1): Pt will perform shower transfer with Mod A in order to increase I in functional transfer. OT Short Term Goal 2 (Week 1): Pt will perform UB dressing with Mod A in order to increase I in self care. OT Short Term Goal 3 (Week 1): Pt will perform self ROM to L UE with supervision and verbal cues as needed for proper technique. OT Short Term Goal 4 (Week 1): Pt will locate 3 items to the L during B & D session in order to increase L attention during functional task.  Skilled Therapeutic Interventions/Progress Updates:  Patient resting in bed upon arrival.  Engaged in self care retraining to include sponge bath and dress.  Focused session on activity tolerance, postural control, attention to task secondary to easily distracted, slow and controlled movements, sit><stands, standing balance and tolerance, weight bearing into LLE and LUE during standing, left visual spatial and body attention.  Patient often unsafe with mobility as he will attempt transitional movements when therapist is not prepared.  Patient also occasionally locks then unlocks right w/c brake when not expected - making sit><stands unsafe.  Patient preoccupied with his cell phone during session.  This OT found his phone in the bed while getting out of bed then later found phone battery in the bed.  This OT installed battery and plugged in the phone per patient request however, the phone rang several times during session.  With encouragement, patient willing to put phone down to complete session.  Patient requested back to bed after session secondary to headache and low back pain.  Therapy Documentation Precautions:  Precautions Precautions: Fall Precaution  Comments: L inattention, brace on L ankle from recent fx Restrictions Weight Bearing Restrictions: No Pain: 6/10 headache initially-rest repositioned, 3/10 later in therapy and RN provided medication ADL: See FIM for current functional status  Therapy/Group: Individual Therapy  Denijah Karrer 10/21/2014, 10:58 AM

## 2014-10-21 NOTE — Progress Notes (Signed)
Speech Language Pathology Daily Session Note  Patient Details  Name: Cory [name] MRN: [id number] Date of Birth: 08-01-1961  Today's Date: 10/21/2014 SLP Individual Time: 0800-0900 SLP Individual Time Calculation (min): 60 min  Short Term Goals: Week 1: SLP Short Term Goal 1 (Week 1): Patient will recall and utilize recommended safe swallow strategies with Dys. 1 textures and nectar-thick liquids with Mod vebral cues to minimize overt s/s of aspiration.  SLP Short Term Goal 2 (Week 1): Patient will demonstrate effecient mastication of Dys. 2 textures with Mod verbal cues for 3 consecutive sessions to demonstrate readiness for texture advancement. SLP Short Term Goal 3 (Week 1): Patient will attend to left of environment with Mod question cues. SLP Short Term Goal 4 (Week 1): Patient will demonstrate sustained attention to a basic task for 10 minutes with Mod verbal cues for redirection. SLP Short Term Goal 5 (Week 1): Patient will request help as needed to complete basic problem solving tasks with Mod question cues.  SLP Short Term Goal 6 (Week 1): Patient will conume trials of thin liquids with no overt s/s of aspiration over 3 consecutive sessions to demonstrate readiness for a repeat objective swallow assessment.  Skilled Therapeutic Interventions:  Pt was seen for skilled ST targeting goals for dysphagia and cognition.  Upon arrival, pt was reclined in bed, awake, alert, and agreeable to participate in ST.  Pt stated that he was starving.  SLP provided min cues for sequencing to transfer to sitting upright at edge of bed to maximize alertness and sustained attention during therapeutic activities.  Pt required mod-max assist verbal cues for rate and portion control during presentations of his prescribed diet.  Pt also exhibited mild anterior loss of pureed consistencies due to left labial weakness.  No overt s/s of aspiration were noted with dys 1 solids or nectar thick liquids.  Upon  completion of meal, SLP facilitated the session with a basic, familiar task targeting visual scanning to the left of midline.  Pt initially required hand over hand assist to use finger tracking in addition to the use of a bottom and top marginal anchor to facilitate focused visual attention.  SLP was able to fade cuing to max assist verbal and visual cues for scanning to the left to read items from a menu for ~50% accuracy.   Overall pt is making progress towards meeting goals.  Continue per current plan of care.    FIM:  Comprehension Comprehension Mode: Auditory Comprehension: 5-Follows basic conversation/direction: With extra time/assistive device Expression Expression Mode: Verbal Expression: 5-Expresses basic 90% of the time/requires cueing < 10% of the time. Social Interaction Social Interaction: 3-Interacts appropriately 50 - 74% of the time - May be physically or verbally inappropriate. Problem Solving Problem Solving: 3-Solves basic 50 - 74% of the time/requires cueing 25 - 49% of the time Memory Memory: 2-Recognizes or recalls 25 - 49% of the time/requires cueing 51 - 75% of the time FIM - Eating Eating Activity: 5: Set-up assist for open containers;5: Needs verbal cues/supervision  Pain Pain Assessment Pain Assessment: Faces Faces Pain Scale: Hurts a little bit Pain Type: Acute pain Pain Location: Back Pain Orientation: Lower Pain Descriptors / Indicators: Other (Comment) (tired) Pain Intervention(s): Repositioned  Therapy/Group: Individual Therapy  Kawthar Ennen, Melanee SpryNicole L 10/21/2014, 12:23 PM

## 2014-10-21 NOTE — Care Management (Signed)
[facility] Individual Statement of Services  Patient Name:  Cory [name]  Date:  10/21/2014  Welcome to the [facility].  Our goal is to provide you with an individualized program based on your diagnosis and situation, designed to meet your specific needs.  With this comprehensive rehabilitation program, you will be expected to participate in at least 3 hours of rehabilitation therapies Monday-Friday, with modified therapy programming on the weekends.  Your rehabilitation program will include the following services:  Physical Therapy (PT), Occupational Therapy (OT), Speech Therapy (ST), 24 hour per day rehabilitation nursing, Therapeutic Recreaction (TR), Neuropsychology, Case Management (Social Worker), Rehabilitation Medicine, Nutrition Services and Pharmacy Services  Weekly team conferences will be held on Wednesday to discuss your progress.  Your Social Worker will talk with you frequently to get your input and to update you on team discussions.  Team conferences with you and your family in attendance may also be held.  Expected length of stay: 21-24 days Overall anticipated outcome: min level of assist  Depending on your progress and recovery, your program may change. Your Social Worker will coordinate services and will keep you informed of any changes. Your Social Worker's name and contact numbers are listed  below.  The following services may also be recommended but are not provided by the [facility]:   Driving Evaluations  Home Health Rehabiltiation Services  Outpatient Rehabilitation Services  Vocational Rehabilitation   Arrangements will be made to provide these services after discharge if needed.  Arrangements include referral to agencies that provide these services.  Your insurance has been verified to be:  None-Needs to apply for Medicaid & Disability Your primary doctor is:  None  Pertinent information  will be shared with your doctor and your insurance company.  Social Worker:  Dossie DerBecky Christene Pounds, SW [phone] or ([phone]) [phone]  Information discussed with and copy given to patient by: Lucy Chrisupree, Hiawatha Dressel G, 10/21/2014, 10:21 AM

## 2014-10-22 ENCOUNTER — Inpatient Hospital Stay (HOSPITAL_COMMUNITY): Payer: Self-pay | Admitting: Speech Pathology

## 2014-10-22 ENCOUNTER — Inpatient Hospital Stay (HOSPITAL_COMMUNITY): Payer: Medicaid Other | Admitting: Occupational Therapy

## 2014-10-22 ENCOUNTER — Inpatient Hospital Stay (HOSPITAL_COMMUNITY): Payer: Self-pay

## 2014-10-22 ENCOUNTER — Inpatient Hospital Stay (HOSPITAL_COMMUNITY): Payer: Medicaid Other | Admitting: *Deleted

## 2014-10-22 ENCOUNTER — Inpatient Hospital Stay (HOSPITAL_COMMUNITY): Payer: Self-pay | Admitting: Rehabilitation

## 2014-10-22 DIAGNOSIS — G8110 Spastic hemiplegia affecting unspecified side: Secondary | ICD-10-CM

## 2014-10-22 DIAGNOSIS — I63311 Cerebral infarction due to thrombosis of right middle cerebral artery: Secondary | ICD-10-CM

## 2014-10-22 LAB — CARDIOLIPIN ANTIBODIES, IGG, IGM, IGA
Anticardiolipin IgA: 17 APL U/mL (ref <22)
Anticardiolipin IgG: 24 GPL U/mL — ABNORMAL HIGH (ref <23)
Anticardiolipin IgM: 11 MPL U/mL — ABNORMAL HIGH (ref <11)

## 2014-10-22 MED ORDER — SODIUM CHLORIDE 0.9 % IV SOLN
INTRAVENOUS | Status: AC
  Administered 2014-10-22: 18:00:00 via INTRAVENOUS

## 2014-10-22 MED ORDER — TOPIRAMATE 25 MG PO TABS
50.0000 mg | ORAL_TABLET | Freq: Two times a day (BID) | ORAL | Status: DC
  Administered 2014-10-22 – 2014-10-29 (×16): 50 mg via ORAL
  Filled 2014-10-22 (×19): qty 2

## 2014-10-22 MED ORDER — ASPIRIN EC 81 MG PO TBEC
81.0000 mg | DELAYED_RELEASE_TABLET | Freq: Every day | ORAL | Status: DC
  Administered 2014-10-23 – 2014-11-27 (×36): 81 mg via ORAL
  Filled 2014-10-22 (×38): qty 1

## 2014-10-22 NOTE — Progress Notes (Signed)
Recreational Therapy Assessment and Plan  Patient Details  Name: Cory [name] MRN: [id number] Date of Birth: 1961/03/25 Today's Date: 10/22/2014  Rehab Potential: Good ELOS: 2.5 weeks   Assessment Clinical Impression:Problem List:  Patient Active Problem List   Diagnosis Date Noted  . CVA (cerebral infarction) 10/18/2014  . Right middle cerebral artery stroke   . Dyslipidemia   . Acute CVA (cerebrovascular accident) 10/15/2014  . Stroke 10/15/2014  . Alcoholism 10/15/2014    Past Medical History:  Past Medical History  Diagnosis Date  . Medical history non-contributory    Past Surgical History:  Past Surgical History  Procedure Laterality Date  . No past surgeries      Assessment & Plan Clinical Impression: [name] is a 53 y.o. RH- male with history of HTN, Hyperlipidemia, heavy alcohol use, noncompliance with meds; who was admitted on 10/15/14 with left facial droop, slurred speech, left sided weakness and somnolence. CT head with ischemic changes in R-MCA territory affecting frontal cortex, anterior coronal radiata and dorsal striatum. 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 Carotid dopplers limited due to poor cooperation--R-ICA with 1-30% ICA stenosis. TEE done revealing EF 55-65% with no PFO and increased RA pressure. Hypercoagulopathy panel pending. MBS done revealing moderate oral and mild pharyngeal dysphagia and patient placed on dysphagia 2, nectar liquids. Neurology recommending ASA for secondary stroke prevention. Therapy evaluations done 11/25 and patient limited by dense L- hemiplegia, poor truncal control with impaired poor safety awareness. Plans for loop recorder today? CIR recommended by rehab team and MD for follow up therapy. Patient transferred to CIR on 10/18/2014 .   Pt presents with decreased  activity tolerance, decreased functional mobility, decreased balance, decreased coordination, decreased midline orientation, left inattention, decreased attention, decreased awareness, decreased problem solving, decreased safety, decreased memory and delayed processing Limiting pt's independence with leisure/community pursuits.   Psychosocial / Spiritual Patient agreeable to Pet Therapy: Yes Does patient have pets?: Yes (girlfriend has a Solicitor) Recreational Therapy Orientation Orientation -Reviewed with patient: Available activity resources Strengths/Weaknesses TR Patient demonstrates impairments in the following area(s): Behavior  Plan Rec Therapy Plan Is patient appropriate for Therapeutic Recreation?: Yes Rehab Potential: Good Treatment times per week: Min 1 time per week >20 minutes Estimated Length of Stay: 2.5 weeks TR Treatment/Interventions: Adaptive equipment instruction;1:1 session;Balance/vestibular training;Functional mobility training;Patient/family education;Community reintegration;Cognitive remediation/compensation;Provide activity resources in room;Recreation/leisure participation;Therapeutic activities;Therapeutic exercise;Visual/perceptual remediation/compensation;UE/LE Coordination activities;Wheelchair propulsion/positioning Recommendations for other services: Neuropsych  Recommendations for other services: Neuropsych  Discharge Criteria: Patient will be discharged from TR if patient refuses treatment 3 consecutive times without medical reason.  If treatment goals not met, if there is a change in medical status, if patient makes no progress towards goals or if patient is discharged from hospital.  The above assessment, treatment plan, treatment alternatives and goals were discussed and mutually agreed upon: by patient  [place] 10/22/2014, 4:15 PM

## 2014-10-22 NOTE — Plan of Care (Signed)
Problem: RH SAFETY Goal: RH STG ADHERE TO SAFETY PRECAUTIONS W/ASSISTANCE/DEVICE STG Adhere to Safety Precautions With min Assistance/Device.  Outcome: Not Progressing     

## 2014-10-22 NOTE — Plan of Care (Signed)
Problem: RH COGNITION-NURSING Goal: RH STG USES MEMORY AIDS/STRATEGIES W/ASSIST TO PROBLEM SOLVE STG Uses Memory Aids/Strategies With min Assistance to Problem Solve.  Outcome: Progressing     

## 2014-10-22 NOTE — Progress Notes (Signed)
Speech Language Pathology Daily Session Note  Patient Details  Name: Cory [name] MRN: [id number] Date of Birth: [birth year]  Today's Date: 10/22/2014 SLP Individual Time: [id number] SLP Individual Time Calculation (min): 30 min  Short Term Goals: Week 1: SLP Short Term Goal 1 (Week 1): Patient will recall and utilize recommended safe swallow strategies with Dys. 1 textures and nectar-thick liquids with Mod vebral cues to minimize overt s/s of aspiration.  SLP Short Term Goal 2 (Week 1): Patient will demonstrate effecient mastication of Dys. 2 textures with Mod verbal cues for 3 consecutive sessions to demonstrate readiness for texture advancement. SLP Short Term Goal 3 (Week 1): Patient will attend to left of environment with Mod question cues. SLP Short Term Goal 4 (Week 1): Patient will demonstrate sustained attention to a basic task for 10 minutes with Mod verbal cues for redirection. SLP Short Term Goal 5 (Week 1): Patient will request help as needed to complete basic problem solving tasks with Mod question cues.  SLP Short Term Goal 6 (Week 1): Patient will conume trials of thin liquids with no overt s/s of aspiration over 3 consecutive sessions to demonstrate readiness for a repeat objective swallow assessment.  Skilled Therapeutic Interventions:  Pt was seen for skilled ST targeting cognitive goals.  Upon arrival pt was reclined in bed, awake, alert, and verbose.  SLP provided mod assist verbal cues for sequencing for pt to transfer to sitting upright at edge of bed to maximize alertness and attention during structured therapeutic tasks.   SLP facilitated the session with a basic money management task targeting sustained attention, visual scanning to the left, and functional problem solving.  Pt sorted coins into groups by value following min assist instructional cues.  Once sorted into piles, pt counted money for 100% accuracy with min assist verbal cues.  Significant breakdown was noted  for generating amounts of money when named due to decreased error awareness, decreased working memory, and impulsivity with pt requiring max assist to complete task.  Pt making progress towards meeting goals.  Continue per current plan of care.   FIM:  Comprehension Comprehension Mode: Auditory Comprehension: 5-Understands basic 90% of the time/requires cueing < 10% of the time Expression Expression Mode: Verbal Expression: 4-Expresses basic 75 - 89% of the time/requires cueing 10 - 24% of the time. Needs helper to occlude trach/needs to repeat words. Social Interaction Social Interaction: 4-Interacts appropriately 75 - 89% of the time - Needs redirection for appropriate language or to initiate interaction. Problem Solving Problem Solving: 3-Solves basic 50 - 74% of the time/requires cueing 25 - 49% of the time Memory Memory: 2-Recognizes or recalls 25 - 49% of the time/requires cueing 51 - 75% of the time FIM - Eating Eating Activity: 5: Supervision/cues;4: Helper checks for pocketed food  Pain Pain Assessment Pain Assessment: No/denies pain  Therapy/Group: Individual Therapy   Cory [name], M.A. CCC-SLP  Cory [name], Melanee SpryNicole L 10/22/2014, 4:01 PM

## 2014-10-22 NOTE — Progress Notes (Signed)
Physical Therapy Session Note  Patient Details  Name: Cory SleightStephen [name] MRN: [id number] Date of Birth: 1961-11-11  Today's Date: 10/22/2014 PT Individual Time: 0901-0931 PT Individual Time Calculation (min): 30 min   Short Term Goals: Week 1:  PT Short Term Goal 1 (Week 1): Patient will perform bed mobility from flat surface with modA. PT Short Term Goal 2 (Week 1): Patient will perform functional transfers with modA. PT Short Term Goal 3 (Week 1): Patient will perform gait training 8125' with LRAD and +2 assist. PT Short Term Goal 4 (Week 1): Patient will negotiate 3 steps with R handrail and maxA. PT Short Term Goal 5 (Week 1): Patient will demonstrate carry over with mobility techniques within session with mod cues.  Skilled Therapeutic Interventions/Progress Updates:    Pt received seated in w/c handed off from OT, agreeable to participate in therapy. Pt propelled w/c 150' to rehab gym w/ RUE/RLE, noted difficulty coordinating using UE/LE at same time, pt would tend to propel forward with RUE and then correct path deviation with R foot even after max cueing from therapist to use both at same time to maintain straight path. Because of this, pt required ModA to avoid obstacles on L during propulsion. Pt allowed to safely hit wall on L several times to increase attention to L. In rehab gym pt transferred w/ squat pivot technique w/c>mat table w/ MinA to R, mat table>w/c w/ ModA to L. Pt completed standing activity of reaching for specific horseshoe on L side w/ RUE to increase WBing through LLE. Therapist provided ModA to move sit>stand, MaxA to remain standing with therapist blocking L knee  And providing facilitation at bilateral glutes to encourage upright posture. Pt able to tolerate standing for 45-60 seconds before needing to sit due to pain in L ankle. Pt transported back to room via w/c w/ instructions to turn head to L and name things he saw. Noted pt able to name things on L side in middle  and long distance visual field, difficulty naming items in direct peripheral vision. Pt left seated in w/c w/ quick release belt on and all needs within reach.   Therapy Documentation Precautions:  Precautions Precautions: Fall Precaution Comments: L inattention, brace on L ankle from recent fx Restrictions Weight Bearing Restrictions: No Pain:  No/denies pain  See FIM for current functional status  Therapy/Group: Individual Therapy  Hosie SpangleGodfrey, Veona Bittman  Hosie SpangleJess Creighton Longley, PT, DPT 10/22/2014, 7:38 AM

## 2014-10-22 NOTE — Progress Notes (Signed)
[place] [facility] [place] PHYSICAL MEDICINE & REHABILITATION     PROGRESS NOTE    Subjective/Complaints: Headache,not much change yet, discussed treatment ROS limited due to mental status/affect  Objective: Vital Signs: Blood pressure 103/64, pulse 83, temperature 98.8 F (37.1 C), temperature source Oral, resp. rate 18, SpO2 99 %. No results found.  Recent Labs  10/21/14 0303  WBC 6.7  HGB 15.1  HCT 46.8  PLT PLATELET CLUMPS NOTED ON SMEAR, COUNT APPEARS ADEQUATE    Recent Labs  10/21/14 0303  NA 136*  K 5.4*  CL 101  GLUCOSE 93  BUN 21  CREATININE 0.85  CALCIUM 9.3   CBG (last 3)  No results for input(s): GLUCAP in the last 72 hours.  Wt Readings from Last 3 Encounters:  10/15/14 79.289 kg (174 lb 12.8 oz)  09/03/14 86.183 kg (190 lb)    Physical Exam:  Constitutional: He is oriented to person, place, and time. He appears well-developed and well-nourished.  HENT:  Head: Normocephalic and atraumatic.  Eyes: Conjunctivae are normal. Pupils are equal, round, and reactive to light.  Left eye lid with some drainage  Neck: Normal range of motion. Neck supple.  Cardiovascular: Normal rate and regular rhythm.  Respiratory: Effort normal and breath sounds normal. No respiratory distress. He has no wheezes.  GI: Soft. Bowel sounds are normal.  Musculoskeletal: He exhibits no edema or tenderness.  Neurological: He is alert and oriented to person, place, and time. Delayed processing.  Left facial weakness with mild dysarthria and left inattention.   Has poor insight and awareness of deficits and easily distracted and impulsive. Motor strength 0/5 in left deltoid, biceps, triceps, ?tr grip, hip flexor, trace knee extensor, 0 ankle dorsiflexion plantar flexion 5/5 on the right side in the same muscle groups.  Absent sensation in the left upper and left lower extremity. Poor sitting balance. Visual fields absent to confrontation testing on left side  Skin: Skin is warm and dry.   Psychiatric: His affect is blunt. His speech is slurred. He is withdrawn. He expresses impulsivity.   Assessment/Plan: 1. Functional deficits secondary to right MCA infarct, ?thrombotic, which require 3+ hours per day of interdisciplinary therapy in a comprehensive inpatient rehab setting. Physiatrist is providing close team supervision and 24 hour management of active medical problems listed below. Physiatrist and rehab team continue to assess barriers to discharge/monitor patient progress toward functional and medical goals. FIM: FIM - Bathing Bathing Steps Patient Completed: Chest, Abdomen, Right upper leg Bathing: 2: Max-Patient completes 3-4 6833f 10 parts or 25-49%  FIM - Upper Body Dressing/Undressing Upper body dressing/undressing steps patient completed: Put head through opening of pull over shirt/dress, Thread/unthread right sleeve of pullover shirt/dresss Upper body dressing/undressing: 3: Mod-Patient completed 50-74% of tasks FIM - Lower Body Dressing/Undressing Lower body dressing/undressing steps patient completed: Thread/unthread right pants leg Lower body dressing/undressing: 1: Total-Patient completed less than 25% of tasks  FIM - Toileting Toileting: 1: Total-Patient completed zero steps, helper did all 3 (per Cipriano MileKambi Kelly, NT report)  FIM - Toilet Transfers Toilet Transfers: 2-To toilet/BSC: Max A (lift and lower assist) (per Cipriano MileKambi Kelly, NT report)  FIM - Bed/Chair Transfer Bed/Chair Transfer Assistive Devices: Arm rests Bed/Chair Transfer: 3: Supine > Sit: Mod A (lifting assist/Pt. 50-74%/lift 2 legs, 3: Bed > Chair or W/C: Mod A (lift or lower assist), 3: Chair or W/C > Bed: Mod A (lift or lower assist)  FIM - Locomotion: Wheelchair Distance: 75 Locomotion: Wheelchair: 2: Travels 50 - 149 ft with moderate  assistance (Pt: 50 - 74%) FIM - Locomotion: Ambulation Locomotion: Ambulation Assistive Devices:  (R handrail) Ambulation/Gait Assistance: 2: Max assist, 1: +2  Total assist (+2 for chair follow) Locomotion: Ambulation: 1: Two helpers  Comprehension Comprehension Mode: Auditory Comprehension: 5-Follows basic conversation/direction: With no assist  Expression Expression Mode: Verbal Expression: 5-Expresses basic 90% of the time/requires cueing < 10% of the time.  Social Interaction Social Interaction: 3-Interacts appropriately 50 - 74% of the time - May be physically or verbally inappropriate.  Problem Solving Problem Solving: 3-Solves basic 50 - 74% of the time/requires cueing 25 - 49% of the time  Memory Memory: 2-Recognizes or recalls 25 - 49% of the time/requires cueing 51 - 75% of the time  Medical Problem List and Plan: 1. Functional deficits secondary to R-MCA infarct question embolic v/s thrombotic with resultant left hemiplegia with sensory deficits, left facial weakness, left neglect and dysphagia. 2. DVT Prophylaxis/Anticoagulation: Pharmaceutical: Lovenox 3. Pain Management: tylenol for h/a for now. Trial topamax for post stroke headache, increase dose 4. Mood: Seems withdrawn--will monitor for now. LCSW to follow for support and evaluation.  5. Neuropsych: This patient is capable of making decisions on his own behalf. 6. Skin/Wound Care: Routine pressure relief measures.  7. Fluids/Electrolytes/Nutrition: Monitor I/O. Follow up admission labs tomorrow 8. HTN: 9. H/o Alcohol abuse/Tobacco use: Will add nicotine patch as asking for his snuff. CIWA protocol. Continue folic acid and thiamine.  10. Dyslipidemia: 11.  Mild HyperK, ? Hemolysis , no obvious medication source, will recheck in am  LOS (Days) 4 A FACE TO FACE EVALUATION WAS PERFORMED  Erick ColaceKIRSTEINS,Quirino Kakos E 10/22/2014 7:36 AM

## 2014-10-22 NOTE — Plan of Care (Signed)
Problem: RH PAIN MANAGEMENT Goal: RH STG PAIN MANAGED AT OR BELOW PT'S PAIN GOAL Less than 2  Outcome: Progressing     

## 2014-10-22 NOTE — Plan of Care (Signed)
Problem: RH COGNITION-NURSING Goal: RH STG ANTICIPATES NEEDS/CALLS FOR ASSIST W/ASSIST/CUES STG Anticipates Needs/Calls for Assist With min Assistance/Cues.  Outcome: Not Progressing

## 2014-10-22 NOTE — Progress Notes (Signed)
Physical Therapy Session Note  Patient Details  Name: Cory SleightStephen [name] MRN: [id number] Date of Birth: 1961-09-01  Today's Date: 10/22/2014 PT Individual Time: 1000-1109 PT Individual Time Calculation (min): 69 min   Short Term Goals: Week 1:  PT Short Term Goal 1 (Week 1): Patient will perform bed mobility from flat surface with modA. PT Short Term Goal 2 (Week 1): Patient will perform functional transfers with modA. PT Short Term Goal 3 (Week 1): Patient will perform gait training 6825' with LRAD and +2 assist. PT Short Term Goal 4 (Week 1): Patient will negotiate 3 steps with R handrail and maxA. PT Short Term Goal 5 (Week 1): Patient will demonstrate carry over with mobility techniques within session with mod cues.  Skilled Therapeutic Interventions/Progress Updates:   Pt received sitting in w/c in room, speaking with girlfriend on phone.  Requires max cues to speak with her later and participate in therapy.  Before hanging up, spoke briefly with girlfriend on what we are working on in therapy and how much assist he currently requires.  Also discussed pts cognition, as that was her main question.  Discussed that we all work on cognition, but that he is seeing speech therapy for more targeted cognitive goals.  Pts girlfriend verbalized understanding.  Pt self propelled to therapy gym this morning using R hemi technique at min A level.  Allowed pt to run into obstacles (within safety) on L side in order to increase attention to the R.  Pt would frequently comment, "Where did that come from?" or "Who put that there?"  Educated to turn head to scan environment for obstacles.  Skilled session focused on gait training in hallway, stair negotiation and sitting/standing NMR in standing frame on and EOM.  Performed gait x 25' x 2 reps with L allard AFO donned for DF assist during gait.  Note pt better able to clear LLE today but tends to adduct and require assist for placement, and compensates with trunk  extension.  Placed mirror at end of rail for better feedback on posture, but required max continuous cues throughout for upright posture when stepping.  Transitioned to stair negotiation, up/down 3, 4" steps and 2, 6" steps with R rail.  Requires constant cues for safety and to wait on therapist, as he continues to be very impulsive with all mobility.  Max verbal cues for stepping sequence throughout.  Progressed to standing in standing frame x approx 8-9 mins focusing on upright posture and reaching to the L for increased WB through LLE while also assisting with WB through LUE.  Also placed 2" step under RLE to increase WB and weight shift to the L.  Pt able to correct posture very well, however unable to sustain.  Ended session seated on EOM, working on sitting balance, upright posture, sit<>stands with equal WB through BLEs as he tends to lean heavily on the R and reach for any object to balance him.  Provided visual targets for pt to reach for to assist with this.  Transferred back to w/c at mod A level.  Assisted back to room and left in w/c with quick release belt donned and all needs in reach.   Note pt continues to demonstrate very decreased insight to deficits as he still mentioned that he can use his crutches and that he will be able to built his ramp, despite cues for L side being very weak from stroke and the fact that he has "less attention."  Continue to provide max  education that he would not be able to build his ramp or use crutches due to weakness and safety concerns.    Therapy Documentation Precautions:  Precautions Precautions: Fall Precaution Comments: L inattention, brace on L ankle from recent fx Restrictions Weight Bearing Restrictions: No   Pain: Pt reports slight pain in L ankle during gait.    Locomotion : Ambulation Ambulation/Gait Assistance: 2: Max assist;1: +2 Total assist Wheelchair Mobility Distance: 100   See FIM for current functional status  Therapy/Group:  Individual Therapy  [name], Freeda Spivey Ann 10/22/2014, 12:05 PM

## 2014-10-22 NOTE — Progress Notes (Signed)
Patient found lying in bed on rounding. Patient had removed quick release and transferred self. Patient educated on need to call for assistance. Safety plan updated to include patient to be at nurses station when in wheelchair.

## 2014-10-22 NOTE — Progress Notes (Signed)
Occupational Therapy Session Note  Patient Details  Name: Cory SleightStephen Stehlik MRN: [id number] Date of Birth: May 09, 1961  Today's Date: 10/22/2014 OT Individual Time: 0800-0900 OT Individual Time Calculation (min): 60 min   Short Term Goals: Week 1:  OT Short Term Goal 1 (Week 1): Pt will perform shower transfer with Mod A in order to increase I in functional transfer. OT Short Term Goal 2 (Week 1): Pt will perform UB dressing with Mod A in order to increase I in self care. OT Short Term Goal 3 (Week 1): Pt will perform self ROM to L UE with supervision and verbal cues as needed for proper technique. OT Short Term Goal 4 (Week 1): Pt will locate 3 items to the L during B & D session in order to increase L attention during functional task.  Skilled Therapeutic Interventions/Progress Updates:  Patient resting in w/c upon arrival with QRB and half lap tray in place.  Engaged in self care retraining to include sponge bath (declined shower), dress and groom.  Focused session on visual attention to left visual field and left side of body, sustained attention in quiet environment, hemi bathing and dressing techniques, dynamic sitting and standing balance, sit><stands, midline orientation, forced use and weight bearing through LLE and LUE.  Patient continues to require min cues to slow down, attend to and manage his LUE during all functional mobility and with BADL tasks, to be certain his LLE is in optimal position prior to sit><stand and to shift weight to right when standing secondary to pusher tendencies.    Therapy Documentation Precautions:  Precautions Precautions: Fall Precaution Comments: L inattention, brace on L ankle from recent fx Restrictions Weight Bearing Restrictions: No Pain: Denies pain ADL: See FIM for current functional status  Therapy/Group: Individual Therapy  Zanyiah Posten 10/22/2014, 8:55 AM

## 2014-10-22 NOTE — Plan of Care (Signed)
Problem: RH BLADDER ELIMINATION Goal: RH STG MANAGE BLADDER WITH ASSISTANCE STG Manage Bladder With Mod I Assistance  Outcome: Progressing     

## 2014-10-22 NOTE — Plan of Care (Signed)
Problem: RH BLADDER ELIMINATION Goal: RH STG MANAGE BLADDER WITH ASSISTANCE STG Manage Bladder With Mod I Assistance  Outcome: Progressing  Problem: RH SAFETY Goal: RH STG ADHERE TO SAFETY PRECAUTIONS W/ASSISTANCE/DEVICE STG Adhere to Safety Precautions With min Assistance/Device.  Outcome: Not Progressing Patient transferred self to bed- requires re education  Problem: RH COGNITION-NURSING Goal: RH STG USES MEMORY AIDS/STRATEGIES W/ASSIST TO PROBLEM SOLVE STG Uses Memory Aids/Strategies With min Assistance to Problem Solve.  Outcome: Progressing Goal: RH STG ANTICIPATES NEEDS/CALLS FOR ASSIST W/ASSIST/CUES STG Anticipates Needs/Calls for Assist With min Assistance/Cues.  Outcome: Not Progressing  Problem: RH PAIN MANAGEMENT Goal: RH STG PAIN MANAGED AT OR BELOW PT'S PAIN GOAL Less than 2  Outcome: Progressing

## 2014-10-23 ENCOUNTER — Inpatient Hospital Stay (HOSPITAL_COMMUNITY): Payer: Medicaid Other | Admitting: Occupational Therapy

## 2014-10-23 ENCOUNTER — Inpatient Hospital Stay (HOSPITAL_COMMUNITY): Payer: Medicaid Other | Admitting: Speech Pathology

## 2014-10-23 ENCOUNTER — Ambulatory Visit (HOSPITAL_COMMUNITY): Payer: Self-pay | Admitting: Rehabilitation

## 2014-10-23 ENCOUNTER — Encounter (HOSPITAL_COMMUNITY): Payer: Self-pay | Admitting: *Deleted

## 2014-10-23 DIAGNOSIS — G8110 Spastic hemiplegia affecting unspecified side: Secondary | ICD-10-CM | POA: Diagnosis present

## 2014-10-23 DIAGNOSIS — R414 Neurologic neglect syndrome: Secondary | ICD-10-CM | POA: Diagnosis present

## 2014-10-23 DIAGNOSIS — R209 Unspecified disturbances of skin sensation: Secondary | ICD-10-CM

## 2014-10-23 DIAGNOSIS — I69398 Other sequelae of cerebral infarction: Secondary | ICD-10-CM

## 2014-10-23 LAB — BASIC METABOLIC PANEL
ANION GAP: 16 — AB (ref 5–15)
BUN: 19 mg/dL (ref 6–23)
CALCIUM: 9.3 mg/dL (ref 8.4–10.5)
CO2: 18 mEq/L — ABNORMAL LOW (ref 19–32)
Chloride: 105 mEq/L (ref 96–112)
Creatinine, Ser: 0.97 mg/dL (ref 0.50–1.35)
GFR calc Af Amer: >90 mL/min (ref >90)
Glucose, Bld: 93 mg/dL (ref 70–99)
Potassium: 4.5 mEq/L (ref 3.7–5.3)
SODIUM: 139 meq/L (ref 137–147)

## 2014-10-23 MED ORDER — PANTOPRAZOLE SODIUM 40 MG PO TBEC
40.0000 mg | DELAYED_RELEASE_TABLET | Freq: Every day | ORAL | Status: DC
  Administered 2014-10-26 – 2014-11-27 (×33): 40 mg via ORAL
  Filled 2014-10-23 (×35): qty 1

## 2014-10-23 MED ORDER — ENOXAPARIN SODIUM 40 MG/0.4ML ~~LOC~~ SOLN
40.0000 mg | SUBCUTANEOUS | Status: DC
  Administered 2014-10-24 – 2014-11-27 (×32): 40 mg via SUBCUTANEOUS
  Filled 2014-10-23 (×36): qty 0.4

## 2014-10-23 MED ORDER — PANTOPRAZOLE SODIUM 40 MG IV SOLR
40.0000 mg | Freq: Two times a day (BID) | INTRAVENOUS | Status: AC
  Administered 2014-10-23 – 2014-10-25 (×4): 40 mg via INTRAVENOUS
  Filled 2014-10-23 (×4): qty 40

## 2014-10-23 NOTE — Progress Notes (Signed)
Social Work Patient ID: Cory [name], male   DOB: 01-May-1961, 53 y.o.   MRN: [id number] Met with pt and spoke with his daughter-Tyisha to inform of team conference goals-supervision/min level and discharge 12/16.  She will discuss with Aunt-pt's sister and get back with this worker. They have not included pt's girlfriend due to he did not want her included.  He states; " If I have to I'll go to a nursing home."  So pt is agreeable if this becomes the plan.  Will work with all of a discharge plan. Still trying to get financial counselors to have pt apply for disability and Medicaid.

## 2014-10-23 NOTE — Progress Notes (Signed)
Occupational Therapy Session Note  Patient Details  Name: Cory [name] MRN: [id number] Date of Birth: July 17, 1961  Today's Date: 10/23/2014 OT Individual Time: [id number] and 1430-1500 OT Individual Time Calculation (min): 60 min and 30 min   Short Term Goals: Week 1:  OT Short Term Goal 1 (Week 1): Pt will perform shower transfer with Mod A in order to increase I in functional transfer. OT Short Term Goal 2 (Week 1): Pt will perform UB dressing with Mod A in order to increase I in self care. OT Short Term Goal 3 (Week 1): Pt will perform self ROM to L UE with supervision and verbal cues as needed for proper technique. OT Short Term Goal 4 (Week 1): Pt will locate 3 items to the L during B & D session in order to increase L attention during functional task.  Skilled Therapeutic Interventions/Progress Updates:  Session 1:Upon entering the room, pt supine in bed with head slightly elevated. Pt with supine > sit with Mod A this session to sit EOB. Pt required Mod A squat pivot transfer into wheelchair from bed. OT session with focus on dynamic standing balance, hemiplegic dressing techniques, STS, use of R UE as stabilizer during functional tasks, L inattention, and safety awareness. Pt requiring L knee block in standing secondary to buckling. Pt standing for washing of peri area and buttocks as well as clothing management with assistance to block L knee and L UE weight bearing on sink while standing during functional tasks. When standing pt repeating, "Stay focused" and utilized mirror to correct posture and midline orientation while standing. Pt seated in wheelchair with  QRB donned and tray table placed in front for breakfast.  Session 2: Upon entering the room, pt seated in wheelchair with RN present in the room providing supervision for drinking. OT session with focus on visual scanning, attention to the L -strategies (pt edu),and problem solving. Pt given word search in which words to be  found were listed on the L side of the paper. Therapist calling out words for pt to find in the list on the L side. Pt having greatest difficulty finding items on the bottom left side of paper.Pt requiring max verbal cues and education regarding the use of boarder of paper to scan in order to see entire image. Pt able to identify 5/6 words from list with increased time and max verbal cues. Pt observed to be attempting to compensate by moving paper towards R side of table to moving body towards L to have paper be in right visual field. Pt able to located 2/6 words with use of blocking 1 row at a time to search for letters to make up word. Pt requesting to return to bed at end of session with Min A squat pivot to bed and Min A sit >supine. Bed alarm on and call bell within reach upon exiting the room.   Therapy Documentation Precautions:  Precautions Precautions: Fall Precaution Comments: L inattention, brace on L ankle from recent fx Restrictions Weight Bearing Restrictions: No General:   Vital Signs:   Pain: Pain Assessment Pain Score: 0-No pain  See FIM for current functional status  Therapy/Group: Individual Therapy  [name], [name] 10/23/2014, 11:24 AM

## 2014-10-23 NOTE — Patient Care Conference (Signed)
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 Hemiparesis, left 10/23/2014  . Left-sided neglect 10/23/2014  . Alterations of sensations following CVA (cerebrovascular accident) 10/23/2014  . CVA (cerebral infarction) 10/18/2014  . Right middle cerebral artery stroke   . Dyslipidemia   . Acute CVA (cerebrovascular accident) 10/15/2014  . Stroke 10/15/2014  . Alcoholism 10/15/2014    Expected Discharge Date: Expected Discharge Date: 11/06/14  Team Members Present: Physician leading conference: Dr. Claudette LawsAndrew Kirsteins Social Worker Present: Dossie DerBecky Sandar Krinke, LCSW Nurse Present: Carmie EndAngie Joyce, RN PT Present: Bayard Huggerebecca Varner, PT OT Present: Callie FieldingKatie Pittman, OT SLP Present: Fae PippinMelissa Bowie, SLP PPS Coordinator present : Tora DuckMarie Noel, RN, CRRN     Current Status/Progress Goal Weekly Team Focus  Medical   Severe left neglect, flaccid hemiparesis, severe sensory deficits  Upgrade functional status to allow for home discharge  Improve awareness of left side   Bowel/Bladder   continent         Swallow/Nutrition/ Hydration     na        ADL's   Overall Mod-Max assist  Overall Supervision-Min assist  Left spatial and body attention, sustained attention, awareness, dynamic sitting & standing balance, midline orientation, left sided NMR, patient and caregiver education    Mobility   Mod A for bed mobility, mod A squat pivot transfers, mod/max A for sit<>stands, max A for gait.  Pt limited by  decreased awareness, decreased safety, and impulsivity  S to min A overall  R NMR, L attention, transfers, gait, standing balance, pt/family education   Communication     na        Safety/Cognition/ Behavioral Observations  lft inattention, maintain safety with min assist   increased awareness of lft side and maintain safety with cues  reinforce awareness of lft side and safety precautions to prevent harm to lft side   Pain   post stroke HA treated with topamax prn  Headache resolved  monitor headache freqency and effectiveness of topamax   Skin   dsg lft chest loop recorder implant site  dsg off and no incision care at discharge  monitor site/dsg for drainage      *See Care Plan and progress notes for long and short-term goals.  Barriers to Discharge: See above, heavy physical assist level, poor safety awareness    Possible Resolutions to Barriers:  Continue rehabilitation program    Discharge Planning/Teaching Needs:  pt reports unable to access medications 2/2 financial issues; could not afford and did not have MD since moving from [place] 2 yrs ago Family trying to come up with a discharge plan-probable NHP. Girlfriend here, daughter in [place] and sister in [place]     Team Discussion:  Goals -supervision -min level, safety and judgement issues from stroke. Headache better. No active movement in affected side. Family coming up with plan or NHP only option  Revisions to Treatment Plan:  Doesn't have 24 hr care   Continued Need for Acute  Rehabilitation Level of Care: The patient requires daily medical management by a physician with specialized training in physical medicine and rehabilitation for the following conditions: Daily direction of a multidisciplinary physical rehabilitation program to ensure safe treatment while eliciting the highest outcome that is of practical value to the patient.: Yes Daily medical management of patient stability for increased activity during participation in  an intensive rehabilitation regime.: Yes Daily analysis of laboratory values and/or radiology reports with any subsequent need for medication adjustment of medical intervention for : Neurological problems;Other  Lucy ChrisDupree, Estaban [place] G 10/24/2014, 8:39 AM

## 2014-10-23 NOTE — Progress Notes (Signed)
Speech Language Pathology Daily Session Note  Patient Details  Name: Cory SleightStephen [name] MRN: [id number] Date of Birth: 04-06-1961  Today's Date: 10/23/2014 SLP Individual Time: 1035-1105 SLP Individual Time Calculation (min): 30 min  Short Term Goals: Week 1: SLP Short Term Goal 1 (Week 1): Patient will recall and utilize recommended safe swallow strategies with Dys. 1 textures and nectar-thick liquids with Mod vebral cues to minimize overt s/s of aspiration.  SLP Short Term Goal 2 (Week 1): Patient will demonstrate effecient mastication of Dys. 2 textures with Mod verbal cues for 3 consecutive sessions to demonstrate readiness for texture advancement. SLP Short Term Goal 3 (Week 1): Patient will attend to left of environment with Mod question cues. SLP Short Term Goal 4 (Week 1): Patient will demonstrate sustained attention to a basic task for 10 minutes with Mod verbal cues for redirection. SLP Short Term Goal 5 (Week 1): Patient will request help as needed to complete basic problem solving tasks with Mod question cues.  SLP Short Term Goal 6 (Week 1): Patient will conume trials of thin liquids with no overt s/s of aspiration over 3 consecutive sessions to demonstrate readiness for a repeat objective swallow assessment.  Skilled Therapeutic Interventions: Skilled treatment session focused on addressing dysphagia and cognition goals. SLP facilitated session with set-up of Dys.2 textures, as well as Max faded to Mod verbal cues for recall and utilization of safe swallow compensatory strategies: slow pace, lingual sweep, swallow prior to next bite and Max multimodal cues for management of left anterior loss of boluses.  SLP cues were effective at preventing overt s/s of aspiration throughout session.  SLP also facilitated session with a structured scanning task and Max multimodal cues to locate given letters.  Continue with current plan of care.     FIM:  Comprehension Comprehension Mode:  Auditory Comprehension: 5-Understands basic 90% of the time/requires cueing < 10% of the time Expression Expression Mode: Verbal Expression: 4-Expresses basic 75 - 89% of the time/requires cueing 10 - 24% of the time. Needs helper to occlude trach/needs to repeat words. Social Interaction Social Interaction: 4-Interacts appropriately 75 - 89% of the time - Needs redirection for appropriate language or to initiate interaction. Problem Solving Problem Solving: 2-Solves basic 25 - 49% of the time - needs direction more than half the time to initiate, plan or complete simple activities Memory Memory: 2-Recognizes or recalls 25 - 49% of the time/requires cueing 51 - 75% of the time FIM - Eating Eating Activity: 5: Supervision/cues;4: Helper checks for pocketed food;5: Set-up assist for open containers  Pain Pain Assessment Pain Assessment: No/denies pain Pain Score: 0-No pain  Therapy/Group: Individual Therapy  Charlane FerrettiMelissa Taurean Ju, M.A., CCC-SLP [phone]  Cory [name] 10/23/2014, 12:53 PM

## 2014-10-23 NOTE — Progress Notes (Signed)
Patient with liquid emesis followed by blood tinged expectorate. Likely due to Chesapeake EnergyMallory Weiss tears (h/o heavy alcohol use). Will add IV Protonix for 48 hours and hold Lovenox today. Check CBC in am. Denies abdominal or esophageal pain.

## 2014-10-23 NOTE — Progress Notes (Signed)
[facility]     PROGRESS NOTE    Subjective/Complaints: Headache, improved Pt with poor safety awareness, "let me get up and walk", found face down in bed after trying to transfer self from chair to bed ROS limited due to mental status/affect  Objective: Vital Signs: Blood pressure 110/70, pulse 61, temperature 98.3 F (36.8 C), temperature source Oral, resp. rate 18, SpO2 100 %. No results found.  Recent Labs  10/21/14 0303  WBC 6.7  HGB 15.1  HCT 46.8  PLT PLATELET CLUMPS NOTED ON SMEAR, COUNT APPEARS ADEQUATE    Recent Labs  10/21/14 0303  NA 136*  K 5.4*  CL 101  GLUCOSE 93  BUN 21  CREATININE 0.85  CALCIUM 9.3   CBG (last 3)  No results for input(s): GLUCAP in the last 72 hours.  Wt Readings from Last 3 Encounters:  10/15/14 79.289 kg (174 lb 12.8 oz)  09/03/14 86.183 kg (190 lb)    Physical Exam:  Constitutional: He is oriented to person, place, and time. He appears well-developed and well-nourished.  HENT:  Head: Normocephalic and atraumatic.  Eyes: Conjunctivae are normal. Pupils are equal, round, and reactive to light.  Left eye lid with some drainage  Neck: Normal range of motion. Neck supple.  Cardiovascular: Normal rate and regular rhythm.  Respiratory: Effort normal and breath sounds normal. No respiratory distress. He has no wheezes.  GI: Soft. Bowel sounds are normal.  Musculoskeletal: He exhibits no edema or tenderness.  Neurological: He is alert and oriented to person, place, and time. Delayed processing.  Left facial weakness with mild dysarthria and left inattention.   Has poor insight and awareness of deficits and easily distracted and impulsive. Motor strength 0/5 in left deltoid, biceps, triceps, ?tr grip, hip flexor, trace knee extensor, 0 ankle dorsiflexion plantar flexion 5/5 on the right side in the same muscle groups.  Absent sensation in the left upper and left lower extremity. Poor sitting  balance. Visual fields absent to confrontation testing on left side  Skin: Skin is warm and dry.  Psychiatric: His affect is blunt. His speech is slurred. He is withdrawn. He expresses impulsivity.   Assessment/Plan: 1. Functional deficits secondary to right MCA infarct, ?thrombotic, which require 3+ hours per day of interdisciplinary therapy in a comprehensive inpatient rehab setting. Physiatrist is providing close team supervision and 24 hour management of active medical problems listed below. Physiatrist and rehab team continue to assess barriers to discharge/monitor patient progress toward functional and medical goals. FIM: FIM - Bathing Bathing Steps Patient Completed: Chest, Abdomen, Right upper leg, Front perineal area, Buttocks Bathing: 3: Mod-Patient completes 5-7 701f 10 parts or 50-74%  FIM - Upper Body Dressing/Undressing Upper body dressing/undressing steps patient completed: Put head through opening of pull over shirt/dress, Thread/unthread right sleeve of pullover shirt/dresss, Thread/unthread left sleeve of pullover shirt/dress Upper body dressing/undressing: 4: Min-Patient completed 75 plus % of tasks FIM - Lower Body Dressing/Undressing Lower body dressing/undressing steps patient completed: Thread/unthread right pants leg, Thread/unthread left pants leg, Don/Doff left sock, Don/Doff right shoe Lower body dressing/undressing: 2: Max-Patient completed 25-49% of tasks  FIM - Toileting Toileting: 1: Total-Patient completed zero steps, helper did all 3 (per Cipriano MileKambi Kelly, NT report)  FIM - Toilet Transfers Toilet Transfers: 2-To toilet/BSC: Max A (lift and lower assist) (per Cipriano MileKambi Kelly, NT report)  FIM - Bed/Chair Transfer Bed/Chair Transfer Assistive Devices: Arm rests Bed/Chair Transfer: 1: Two helpers  FIM - Locomotion: Wheelchair Distance: 100 Locomotion:  Wheelchair: 2: Travels 50 - 149 ft with minimal assistance (Pt.>75%) FIM - Locomotion: Ambulation Locomotion:  Ambulation Assistive Devices: Other (comment) (R handrail with L allard AFO) Ambulation/Gait Assistance: 2: Max assist, 1: +2 Total assist Locomotion: Ambulation: 1: Two helpers  Comprehension Comprehension Mode: Auditory Comprehension: 5-Understands basic 90% of the time/requires cueing < 10% of the time  Expression Expression Mode: Verbal Expression: 4-Expresses basic 75 - 89% of the time/requires cueing 10 - 24% of the time. Needs helper to occlude trach/needs to repeat words.  Social Interaction Social Interaction: 4-Interacts appropriately 75 - 89% of the time - Needs redirection for appropriate language or to initiate interaction.  Problem Solving Problem Solving: 3-Solves basic 50 - 74% of the time/requires cueing 25 - 49% of the time  Memory Memory: 2-Recognizes or recalls 25 - 49% of the time/requires cueing 51 - 75% of the time  Medical Problem List and Plan: 1. Functional deficits secondary to R-MCA infarct question embolic v/s thrombotic with resultant left hemiplegia with sensory deficits, left facial weakness, left neglect and dysphagia. 2. DVT Prophylaxis/Anticoagulation: Pharmaceutical: Lovenox 3. Pain Management: tylenol for h/a for now. Trial topamax for post stroke headache, increase dose 4. Mood: Seems withdrawn--will monitor for now. LCSW to follow for support and evaluation.  5. Neuropsych: This patient is capable of making decisions on his own behalf. 6. Skin/Wound Care: Routine pressure relief measures.  7. Fluids/Electrolytes/Nutrition: Monitor I/O. Follow up admission labs tomorrow 8. HTN: 9. H/o Alcohol abuse/Tobacco use: Will add nicotine patch as asking for his snuff. CIWA protocol. Continue folic acid and thiamine.  10. Dyslipidemia: 11.  Mild HyperK, ? Hemolysis , no obvious medication source, will recheck in am  LOS (Days) 5 A FACE TO FACE EVALUATION WAS PERFORMED  Erick ColaceKIRSTEINS,Courtland Reas E 10/23/2014 7:51 AM

## 2014-10-23 NOTE — Progress Notes (Signed)
Physical Therapy Session Note  Patient Details  Name: Cory [name] MRN: [id number] Date of Birth: 1961/02/26  Today's Date: 10/23/2014 PT Individual Time: 1300-1400 PT Individual Time Calculation (min): 60 min   Short Term Goals: Week 2:     Skilled Therapeutic Interventions/Progress Updates:   Pt received lying in bed, agreeable to therapy session.  Performed bed mobility with HOB flat and without rails to further challenge trunk control and strengthening.  Provided mod cues for rolling to side and to pay attention to LUE when rolling.  Pt able to assist with bringing LLE out of bed today and did very well elevating trunk into sitting.  Transferred to w/c at mod A level (close to min A).  Did much better with transfers today in that he took his time and better followed commands prior to transfer.  Pt self propelled to/from therapy gym x 100'x 2 reps using R hemi technique.  Continues to require min A and max verbal cues to avoid obstacles on the L and to scan L environment.  RT joined session for co-treat to work on SL for WB through LUE while also addressing trunk shortening and lengthening bilaterally with reaching activity.  Tolerated well and noted good activation on L trunk.  Then progressed to sit<>stand while reaching for target held up by RT to the L to increase WB through LLE during standing x 10 reps.  Requires mod A with max verbal cues for not pushing up with RUE but reaching for target throughout the entire activity. Progressed to performing sitting kinetron with mod resistance x 2 reps of 3 mins.  Cues for upright and midline posture throughout.  Ended session with gait in hallway using R rail x 2 reps of 25'.  Utilized Ship brokermirror for increased visual feedback for upright posture and had L allard AFO donned for DF assist.  Continue to provide facilitation for upright posture, increased weight shift forward and over LLE, stability at L knee for knee extension and max cues for keeping eyes  on mirror instead of looking down.  Pt continues to demonstrate very flat affect during session.  Pt propelled back to room and then assisted to nursing station with quick release belt donned for increased supervision/safety.   Therapy Documentation Precautions:  Precautions Precautions: Fall Precaution Comments: L inattention, brace on L ankle from recent fx Restrictions Weight Bearing Restrictions: No   Pain: Pain Assessment Pain Assessment: No/denies pain   Locomotion : Ambulation Ambulation/Gait Assistance: 2: Max assist;1: +2 Total assist Wheelchair Mobility Distance: 100   See FIM for current functional status  Therapy/Group: Individual Therapy  [name], Brittyn Salaz Ann 10/23/2014, 2:06 PM

## 2014-10-24 ENCOUNTER — Inpatient Hospital Stay (HOSPITAL_COMMUNITY): Payer: Medicaid Other | Admitting: Speech Pathology

## 2014-10-24 ENCOUNTER — Inpatient Hospital Stay (HOSPITAL_COMMUNITY): Payer: Medicaid Other | Admitting: *Deleted

## 2014-10-24 ENCOUNTER — Inpatient Hospital Stay (HOSPITAL_COMMUNITY): Payer: Medicaid Other | Admitting: Occupational Therapy

## 2014-10-24 ENCOUNTER — Inpatient Hospital Stay (HOSPITAL_COMMUNITY): Payer: Medicaid Other

## 2014-10-24 DIAGNOSIS — S82402A Unspecified fracture of shaft of left fibula, initial encounter for closed fracture: Secondary | ICD-10-CM

## 2014-10-24 LAB — CBC WITH DIFFERENTIAL/PLATELET
BASOS ABS: 0 10*3/uL (ref 0.0–0.1)
BASOS PCT: 0 % (ref 0–1)
EOS PCT: 3 % (ref 0–5)
Eosinophils Absolute: 0.2 10*3/uL (ref 0.0–0.7)
HCT: 45.2 % (ref 39.0–52.0)
Hemoglobin: 14.7 g/dL (ref 13.0–17.0)
Lymphocytes Relative: 36 % (ref 12–46)
Lymphs Abs: 2.3 10*3/uL (ref 0.7–4.0)
MCH: 27.9 pg (ref 26.0–34.0)
MCHC: 32.5 g/dL (ref 30.0–36.0)
MCV: 85.9 fL (ref 78.0–100.0)
Monocytes Absolute: 0.7 10*3/uL (ref 0.1–1.0)
Monocytes Relative: 11 % (ref 3–12)
NEUTROS ABS: 3.1 10*3/uL (ref 1.7–7.7)
Neutrophils Relative %: 50 % (ref 43–77)
PLATELETS: 238 10*3/uL (ref 150–400)
RBC: 5.26 MIL/uL (ref 4.22–5.81)
RDW: 12.3 % (ref 11.5–15.5)
WBC: 6.3 10*3/uL (ref 4.0–10.5)

## 2014-10-24 NOTE — Progress Notes (Signed)
Speech Language Pathology Daily Session Note  Patient Details  Name: Naida SleightStephen Mccarrell MRN: [id number] Date of Birth: 03-23-1961  Today's Date: 10/24/2014 SLP Individual Time: [id number] SLP Individual Time Calculation (min): 60 min  Short Term Goals: Week 1: SLP Short Term Goal 1 (Week 1): Patient will recall and utilize recommended safe swallow strategies with Dys. 1 textures and nectar-thick liquids with Mod vebral cues to minimize overt s/s of aspiration.  SLP Short Term Goal 2 (Week 1): Patient will demonstrate effecient mastication of Dys. 2 textures with Mod verbal cues for 3 consecutive sessions to demonstrate readiness for texture advancement. SLP Short Term Goal 3 (Week 1): Patient will attend to left of environment with Mod question cues. SLP Short Term Goal 4 (Week 1): Patient will demonstrate sustained attention to a basic task for 10 minutes with Mod verbal cues for redirection. SLP Short Term Goal 5 (Week 1): Patient will request help as needed to complete basic problem solving tasks with Mod question cues.  SLP Short Term Goal 6 (Week 1): Patient will conume trials of thin liquids with no overt s/s of aspiration over 3 consecutive sessions to demonstrate readiness for a repeat objective swallow assessment.  Skilled Therapeutic Interventions: Skilled treatment session focused on addressing dysphagia and cognition goals. SLP facilitated session with set-up of trial tray of Dys.2 textures and nectar-thick liquids via cup, as well as Max multimodal cues for recall and utilization of safe swallow compensatory strategies: small portions, slow pace, swallow prior to next bite and lingual sweep.  While SLP cues were effective at preventing overt s/s of aspiration throughout session patient only consumed minimal amounts of PO and reported that chewing was too much work as a result, recommend to resume Dys.1 textures diet with nectar-thick liquids.  SLP also facilitated session with Max verbal  and Min physical assist to sequence and problem solve bed mobility (sitting edge of bed to supine).     FIM:  Comprehension Comprehension Mode: Auditory Comprehension: 4-Understands basic 75 - 89% of the time/requires cueing 10 - 24% of the time Expression Expression Mode: Verbal Expression: 3-Expresses basic 50 - 74% of the time/requires cueing 25 - 50% of the time. Needs to repeat parts of sentences. Social Interaction Social Interaction: 2-Interacts appropriately 25 - 49% of time - Needs frequent redirection. Problem Solving Problem Solving: 2-Solves basic 25 - 49% of the time - needs direction more than half the time to initiate, plan or complete simple activities Memory Memory: 2-Recognizes or recalls 25 - 49% of the time/requires cueing 51 - 75% of the time FIM - Eating Eating Activity: 5: Supervision/cues;5: Set-up assist for open containers;5: Needs verbal cues/supervision;4: Helper checks for pocketed food;5: Set-up assist for cut food  Pain Pain Assessment Pain Assessment: No/denies pain  Therapy/Group: Individual Therapy  Charlane FerrettiMelissa Fatoumata Albaugh, M.A., CCC-SLP [phone]  Asbury Hair 10/24/2014, 1:17 PM

## 2014-10-24 NOTE — Progress Notes (Signed)
Occupational Therapy Session Note  Patient Details  Name: Cory [name] MRN: [id number] Date of Birth: 1961-02-11  Today's Date: 10/24/2014 OT Individual Time: [id number] OT Individual Time Calculation (min): 60 min    Short Term Goals: Week 1:  OT Short Term Goal 1 (Week 1): Pt will perform shower transfer with Mod A in order to increase I in functional transfer. OT Short Term Goal 2 (Week 1): Pt will perform UB dressing with Mod A in order to increase I in self care. OT Short Term Goal 3 (Week 1): Pt will perform self ROM to L UE with supervision and verbal cues as needed for proper technique. OT Short Term Goal 4 (Week 1): Pt will locate 3 items to the L during B & D session in order to increase L attention during functional task.  Skilled Therapeutic Interventions/Progress Updates:  Upon entering the room, pt supine in bed with RN present giving medication. Pt with no c/o pain this session. Pt continues to decline shower. Pt shirt and pants soaked in urine but pt reporting after using urinal "it rolled back onto me." OT session with focus on safety awareness, self care - hemiplegic dressing techniques, pt education, dynamic standing balance, feeding, and L in attention during functional task. OT provided and set up elastic laces in patients shoes this session in order to increase independence with donning and doffing of shoes. Bathing and dressing performed at sink side this session. Pt required Mod A balance with L knee blocked and L UE weight bearing when standing for functional task. Pt perseverating on family issues and needing max verbal cues for redirection to task.Pt requiring additional verbal cues for strategies and increased time to locate toothbrush and tooth paste to the L of sink. Pt seated in wheelchair with QRB donned with breakfast set up for him with assist to open containers. Pt able to verbalize all items on tray from L to R with increased time and max verbal cues. Pt  requiring verbal cues for safety to eat smaller bites, swallow all food before additional bites, and to slow down. NT assisting pt with finishing food as therapist exited the room.   Therapy Documentation Precautions:  Precautions Precautions: Fall Precaution Comments: L inattention, brace on L ankle from recent fx Restrictions Weight Bearing Restrictions: No  See FIM for current functional status  Therapy/Group: Individual Therapy  [name], [name] L 10/24/2014, 9:15 AM

## 2014-10-24 NOTE — Progress Notes (Signed)
Recreational Therapy Session Note  Patient Details  Name: Cory SleightStephen [name] MRN: [id number] Date of Birth: [birth year] Today's Date: 10/24/2014  Pain: no c/o  Skilled Therapeutic Interventions/Progress Updates: Session focused on activity tolerance, WBing through LUE in side lying, sit->stands, dynamic standing balance.  Pt in side lying reaching outside BOS with min-mod assist & mod instructional cues.  Pt then performed sit->stands reaching outside BOS to left to promote WBing through LLE with sit-.stands & standing with mod assist & max instructional cues. Therapy/Group: Co-Treatment   Cailyn Houdek 10/24/2014, 2:51 PM

## 2014-10-24 NOTE — Progress Notes (Signed)
Patient refused lab draw. Talked with the patient, patient is agreeable for  Lab draw to occur at 0800. [facility]ksARPENTER,Porter Nakama, RN

## 2014-10-24 NOTE — Progress Notes (Signed)
Physical Therapy Session Note  Patient Details  Name: Cory SleightStephen [name] MRN: [id number] Date of Birth: [birth year]  Today's Date: 10/24/2014 PT Individual Time: 1300-1400 PT Individual Time Calculation (min): 60 min   Short Term Goals: Week 1:  PT Short Term Goal 1 (Week 1): Patient will perform bed mobility from flat surface with modA. PT Short Term Goal 2 (Week 1): Patient will perform functional transfers with modA. PT Short Term Goal 3 (Week 1): Patient will perform gait training 2125' with LRAD and +2 assist. PT Short Term Goal 4 (Week 1): Patient will negotiate 3 steps with R handrail and maxA. PT Short Term Goal 5 (Week 1): Patient will demonstrate carry over with mobility techniques within session with mod cues.  Skilled Therapeutic Interventions/Progress Updates:   Pt received semi reclined in bed, agreeable to therapy. Pt transferred to edge of bed with HOB elevated and min A for LLE, max cues for sequencing and technique. Pt reports he can get into w/c without assist but requires min A for squat pivot transfer and total cues for safe hand placement as he reached for armrest with opposite arm and attempted to stand and face wheelchair. Pt propelled w/c using R hemi technique x 150 ft with supervision and total cues for attending to L for obstacle negotiation. Pt repeatedly running into wall on left and stating, "Who put that there?" and "That wasn't there before." Attempted gait x 3 in hallway with use of R rail and L AFO donned with +2 assist for wheelchair follow but patient only able to stand x 2 before sitting down due to L foot pain and able to take 4 steps with max A on last attempt. Recreational therapist joined session for co-treat. Transitioned to tall kneeling with bench in front for UE support with +2 assist to achieve position. Pt resistant to tactile cues for upright posture, therefore provided patient with tactile cues to facilitate upright chest into pattern of resistance on  patient's upper back. Pt unable to maintain position without tactile input. Pt performed sit <> stand from mat for forced use LLE NMR with R hand pushing up on L quadriceps and yoga block placed between feet to prevent LLE adductor/flexor tone. In standing pt performed reaching to L to increase LLE WB and therapist facilitating knee extension and weight shift to L. Pt requesting to go outside throughout session. At end of session pt transported outside hospital lobby and performed community w/c mobility with min-mod A and max cues for safety and attending to L. Pt returned to room and left semi reclined in bed with bed alarm on and all needs within reach.     Therapy Documentation Precautions:  Precautions Precautions: Fall Precaution Comments: L inattention, brace on L ankle from recent fx Restrictions Weight Bearing Restrictions: No Pain: Pain Assessment Pain Assessment: Faces Faces Pain Scale: Hurts even more Pain Type: Acute pain Pain Location: Foot Pain Orientation: Left Pain Descriptors / Indicators: Grimacing;Discomfort Pain Onset: With Activity Pain Intervention(s): Repositioned;Rest   See FIM for current functional status  Therapy/Group: Individual Therapy  Kerney ElbeVarner, Sederick Jacobsen A 10/24/2014, 4:22 PM

## 2014-10-24 NOTE — Progress Notes (Signed)
[place] PHYSICAL MEDICINE & REHABILITATION     PROGRESS NOTE    Subjective/Complaints: No H/A, sitting up in bed, alert, looking more to left I broke my left ankle " a month ago" Left ankle films 10/13 showed fibular fx mild displaced oblique ROS limited due to mental status/affect  Objective: Vital Signs: Blood pressure 120/69, pulse 79, temperature 98.3 F (36.8 C), temperature source Oral, resp. rate 20, weight 77.4 kg (170 lb 10.2 oz), SpO2 100 %. No results found. No results for input(s): WBC, HGB, HCT, PLT in the last 72 hours.  Recent Labs  10/23/14 0705  NA 139  K 4.5  CL 105  GLUCOSE 93  BUN 19  CREATININE 0.97  CALCIUM 9.3   CBG (last 3)  No results for input(s): GLUCAP in the last 72 hours.  Wt Readings from Last 3 Encounters:  10/23/14 77.4 kg (170 lb 10.2 oz)  10/15/14 79.289 kg (174 lb 12.8 oz)  09/03/14 86.183 kg (190 lb)    Physical Exam:  Constitutional: He is oriented to person, place, and time. He appears well-developed and well-nourished.  HENT:  Head: Normocephalic and atraumatic.  Eyes: Conjunctivae are normal. Pupils are equal, round, and reactive to light.   Neck: Normal range of motion. Neck supple.  Cardiovascular: Normal rate and regular rhythm.  Respiratory: Effort normal and breath sounds normal. No respiratory distress. He has no wheezes.  GI: Soft. Bowel sounds are normal.  Musculoskeletal: He exhibits no edema or tenderness. Left ankle splint, no pain with ankle DF/PF Neurological: He is alert and oriented to person, place, and time. Delayed processing.  Left facial weakness with mild dysarthria and left inattention.   Has poor insight and awareness of deficits and easily distracted . Motor strength 0/5 in left deltoid, biceps, triceps, ?tr grip, hip flexor, trace knee extensor, 0 ankle dorsiflexion plantar flexion 5/5 on the right side in the same muscle groups.  Absent sensation in the left upper and left lower extremity.  Poor sitting balance. Visual fields absent to confrontation testing on left side  Skin: Skin is warm and dry.  Psychiatric: His affect is blunt. His speech is slurred. He is withdrawn. He expresses impulsivity.   Assessment/Plan: 1. Functional deficits secondary to right MCA infarct, ?thrombotic, which require 3+ hours per day of interdisciplinary therapy in a comprehensive inpatient rehab setting. Physiatrist is providing close team supervision and 24 hour management of active medical problems listed below. Physiatrist and rehab team continue to assess barriers to discharge/monitor patient progress toward functional and medical goals. FIM: FIM - Bathing Bathing Steps Patient Completed: Chest, Abdomen, Front perineal area, Buttocks, Right upper leg Bathing: 3: Mod-Patient completes 5-7 [id number] 10 parts or 50-74%  FIM - Upper Body Dressing/Undressing Upper body dressing/undressing steps patient completed: Put head through opening of pull over shirt/dress, Thread/unthread right sleeve of pullover shirt/dresss, Thread/unthread left sleeve of pullover shirt/dress Upper body dressing/undressing: 4: Min-Patient completed 75 plus % of tasks FIM - Lower Body Dressing/Undressing Lower body dressing/undressing steps patient completed: Thread/unthread right pants leg, Thread/unthread left pants leg, Don/Doff left sock, Don/Doff right sock, Don/Doff right shoe Lower body dressing/undressing: 2: Max-Patient completed 25-49% of tasks  FIM - Toileting Toileting: 1: Total-Patient completed zero steps, helper did all 3 (per Cipriano MileKambi Kelly, NT report)  FIM - Toilet Transfers Toilet Transfers: 2-To toilet/BSC: Max A (lift and lower assist) (per Cipriano MileKambi Kelly, NT report)  FIM - Bed/Chair Transfer Bed/Chair Transfer Assistive Devices: Arm rests Bed/Chair Transfer: 3: Supine > Sit:  Mod A (lifting assist/Pt. 50-74%/lift 2 legs, 3: Bed > Chair or W/C: Mod A (lift or lower assist)  FIM - Locomotion: Wheelchair Distance:  100 Locomotion: Wheelchair: 2: Travels 50 - 149 ft with minimal assistance (Pt.>75%) FIM - Locomotion: Ambulation Locomotion: Ambulation Assistive Devices: Other (comment) (R handrail and L allard AFO) Ambulation/Gait Assistance: 2: Max assist, 1: +2 Total assist Locomotion: Ambulation: 1: Two helpers  Comprehension Comprehension Mode: Auditory Comprehension: 5-Understands basic 90% of the time/requires cueing < 10% of the time  Expression Expression Mode: Verbal Expression: 4-Expresses basic 75 - 89% of the time/requires cueing 10 - 24% of the time. Needs helper to occlude trach/needs to repeat words.  Social Interaction Social Interaction: 4-Interacts appropriately 75 - 89% of the time - Needs redirection for appropriate language or to initiate interaction.  Problem Solving Problem Solving: 2-Solves basic 25 - 49% of the time - needs direction more than half the time to initiate, plan or complete simple activities  Memory Memory: 1-Recognizes or recalls less than 25% of the time/requires cueing greater than 75% of the time  Medical Problem List and Plan: 1. Functional deficits secondary to R-MCA infarct question embolic v/s thrombotic with resultant left hemiplegia with sensory deficits, left facial weakness, left neglect and dysphagia. 2. DVT Prophylaxis/Anticoagulation: Pharmaceutical: Lovenox 3. Pain Management: tylenol for h/a for now. Trial topamax for post stroke headache, increase dose 4. Mood: Seems withdrawn--will monitor for now. LCSW to follow for support and evaluation.  5. Neuropsych: This patient is capable of making decisions on his own behalf. 6. Skin/Wound Care: Routine pressure relief measures.  7. Fluids/Electrolytes/Nutrition: Monitor I/O. Follow up admission labs tomorrow 8. HTN: 9. H/o Alcohol abuse/Tobacco use: Will add nicotine patch as asking for his snuff. CIWA protocol. Continue folic acid and thiamine.  10. Dyslipidemia: 11.  Mild HyperK, ?  Hemolysis , no obvious medication source, will recheck in am  12.  Hx subacute ankle fx (6-7 wks ago) recheck xrays, no swelling or pain LOS (Days) 6 A FACE TO FACE EVALUATION WAS PERFORMED  KIRSTEINS,ANDREW E 10/24/2014 7:29 AM

## 2014-10-24 NOTE — Plan of Care (Signed)
Problem: RH BLADDER ELIMINATION Goal: RH STG MANAGE BLADDER WITH ASSISTANCE STG Manage Bladder With Mod I Assistance  Outcome: Progressing  Problem: RH SAFETY Goal: RH STG ADHERE TO SAFETY PRECAUTIONS W/ASSISTANCE/DEVICE STG Adhere to Safety Precautions With min Assistance/Device.  Outcome: Progressing

## 2014-10-25 ENCOUNTER — Inpatient Hospital Stay (HOSPITAL_COMMUNITY): Payer: Medicaid Other | Admitting: Physical Therapy

## 2014-10-25 ENCOUNTER — Inpatient Hospital Stay (HOSPITAL_COMMUNITY): Payer: Medicaid Other | Admitting: Occupational Therapy

## 2014-10-25 ENCOUNTER — Inpatient Hospital Stay (HOSPITAL_COMMUNITY): Payer: Medicaid Other | Admitting: Speech Pathology

## 2014-10-25 LAB — CREATININE, SERUM
CREATININE: 1 mg/dL (ref 0.50–1.35)
GFR calc Af Amer: >90 mL/min (ref >90)
GFR calc non Af Amer: 83 mL/min — ABNORMAL LOW (ref >90)

## 2014-10-25 NOTE — Progress Notes (Signed)
Speech Language Pathology Weekly Progress and Session Note  Patient Details  Name: Cory [name] MRN: [id number] Date of Birth: 1961/01/08  Beginning of progress report period: October 19, 2014 End of progress report period: October 25, 2014  Today's Date: 10/25/2014 SLP Individual Time: 1000-1100 SLP Individual Time Calculation (min): 60 min  Short Term Goals: Week 1: SLP Short Term Goal 1 (Week 1): Patient will recall and utilize recommended safe swallow strategies with Dys. 1 textures and nectar-thick liquids with Mod vebral cues to minimize overt s/s of aspiration.  SLP Short Term Goal 1 - Progress (Week 1): Progressing toward goal SLP Short Term Goal 2 (Week 1): Patient will demonstrate effecient mastication of Dys. 2 textures with Mod verbal cues for 3 consecutive sessions to demonstrate readiness for texture advancement. SLP Short Term Goal 2 - Progress (Week 1): Progressing toward goal SLP Short Term Goal 3 (Week 1): Patient will attend to left of environment with Mod question cues. SLP Short Term Goal 3 - Progress (Week 1): Progressing toward goal SLP Short Term Goal 4 (Week 1): Patient will demonstrate sustained attention to a basic task for 10 minutes with Mod verbal cues for redirection. SLP Short Term Goal 4 - Progress (Week 1): Progressing toward goal SLP Short Term Goal 5 (Week 1): Patient will request help as needed to complete basic problem solving tasks with Mod question cues.  SLP Short Term Goal 5 - Progress (Week 1): Revised due to lack of progress SLP Short Term Goal 6 (Week 1): Patient will conume trials of thin liquids with no overt s/s of aspiration over 3 consecutive sessions to demonstrate readiness for a repeat objective swallow assessment. SLP Short Term Goal 6 - Progress (Week 1): Progressing toward goal    New Short Term Goals: Week 2: SLP Short Term Goal 1 (Week 2): Patient will recall and utilize recommended safe swallow strategies with Dys. 1 textures  and nectar-thick liquids with Mod vebral cues to minimize overt s/s of aspiration.  SLP Short Term Goal 2 (Week 2): Patient will demonstrate effecient mastication of Dys. 2 textures with Mod verbal cues for 3 consecutive sessions to demonstrate readiness for texture advancement. SLP Short Term Goal 3 (Week 2): Patient will attend to left of environment with Mod question cues. SLP Short Term Goal 4 (Week 2): Patient will demonstrate sustained attention to a basic task for 10 minutes with Mod verbal cues for redirection. SLP Short Term Goal 5 (Week 2): Patient will label 2 physical and 2 cognitive deficits that have resulted from his CVA with Mod question cues.  SLP Short Term Goal 6 (Week 2): Patient will conume trials of thin liquids with no overt s/s of aspiration over 3 consecutive sessions to demonstrate readiness for a repeat objective swallow assessment.  Weekly Progress Updates: Patient met 0 out of 6 short term goals this reporting period due to limited improvements in overall cognitive abilities. 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  Given that patient continues to require Mod-Max multimodal cues to solve basic problems safely he continues to require skilled SLP services to address these deficits to maximize functional independence and reduce overall burden of care.    Intensity: Minumum of 1-2 x/day, 30 to 90 minutes Frequency: 5 out of 7 days Duration/Length of Stay: 21-24 Treatment/Interventions: Cognitive remediation/compensation;Cueing hierarchy;Dysphagia/aspiration precaution training;Functional tasks;Environmental controls;Internal/external aids;Medication managment;Patient/family education;Speech/Language facilitation;Therapeutic  Activities   Daily Session  Skilled Therapeutic Interventions: Skilled treatment session focused on  addressing dysphagia and cognitive goals.  SLP facilitated session with trials of ice chips and water via cup as well as Max verbal cues to attend to bolus and complete task without impulsivity, which resulted in no overt s/s of aspiration; however, given known history of sensory deficits recommend to continue with current diet orders.  SLP also facilitated session with Max assist question cues to facilitate identification of 2 cognitive and 2 physical deficits.  This poor intellectual awareness results in patient's need for Max multimodal cues to sustain attention to task for ~10 minutes, attend to left side of environment and solve basic tasks safely.  SLP will continue efforts.       FIM:  Comprehension Comprehension Mode: Auditory Comprehension: 4-Understands basic 75 - 89% of the time/requires cueing 10 - 24% of the time Expression Expression Mode: Verbal Expression: 4-Expresses basic 75 - 89% of the time/requires cueing 10 - 24% of the time. Needs helper to occlude trach/needs to repeat words. Social Interaction Social Interaction: 2-Interacts appropriately 25 - 49% of time - Needs frequent redirection. Problem Solving Problem Solving: 3-Solves basic 50 - 74% of the time/requires cueing 25 - 49% of the time Memory Memory: 2-Recognizes or recalls 25 - 49% of the time/requires cueing 51 - 75% of the time FIM - Eating Eating Activity: 5: Needs verbal cues/supervision;4: Help with managing cup/glass General    Pain Pain Assessment Pain Assessment: No/denies pain  Therapy/Group: Individual Therapy  Carmelia Roller., [place] [phone]  [place] 10/25/2014, 12:47 PM

## 2014-10-25 NOTE — Progress Notes (Signed)
Occupational Therapy Session Note  Patient Details  Name: Cory [name] MRN: [id number] Date of Birth: 09-03-1961  Today's Date: 10/25/2014 OT Individual Time: [id number] OT Individual Time Calculation (min): 60 min    Short Term Goals: Week 1:  OT Short Term Goal 1 (Week 1): Pt will perform shower transfer with Mod A in order to increase I in functional transfer. OT Short Term Goal 2 (Week 1): Pt will perform UB dressing with Mod A in order to increase I in self care. OT Short Term Goal 3 (Week 1): Pt will perform self ROM to L UE with supervision and verbal cues as needed for proper technique. OT Short Term Goal 4 (Week 1): Pt will locate 3 items to the L during B & D session in order to increase L attention during functional task.  Skilled Therapeutic Interventions/Progress Updates:  Upon entering the room, pt seated in wheelchair eating breakfast with RN present for supervision. Pt with no c/o pain this session. OT providing supervision and verbal cues for slowing down and cues to swallow. Pt with minimal spillage noted this session. Pt continues to decline shower even with encouragement from therapist. Pt performing B & D session seated at sink side in wheelchair. Max verbal cues to lock breaks when standing for LB clothing management and hygiene. Pt required Mod A for balance with L knee blocked into extension. Utilized mirror to correct posture and lean to the R while standing while weight bearing through L UE. Therapist provided hand over hand assist for L UE to to wash R UE and apply deodorant. Pt able to donn R sock and shoe with elastic laces this session. Grooming performed with set up A to open tooth paste. Pt seated in wheelchair with QRB donned and call bell within reach awaiting next therapist.   Therapy Documentation Precautions:  Precautions Precautions: Fall Precaution Comments: L inattention, brace on L ankle from recent fx Restrictions Weight Bearing Restrictions:  No Pain: Pain Assessment Pain Assessment: No/denies pain  See FIM for current functional status  Therapy/Group: Individual Therapy  [name], Symone Cornman L 10/25/2014, 11:35 AM

## 2014-10-25 NOTE — Progress Notes (Signed)
Physical Therapy Session Note  Patient Details  Name: Cory SleightStephen Scheibel MRN: [id number] Date of Birth: 1961/11/10  Today's Date: 10/25/2014 PT Individual Time: 1310-1410 PT Individual Time Calculation (min): 60 min   Short Term Goals: Week 1:  PT Short Term Goal 1 (Week 1): Patient will perform bed mobility from flat surface with modA. PT Short Term Goal 2 (Week 1): Patient will perform functional transfers with modA. PT Short Term Goal 3 (Week 1): Patient will perform gait training 5025' with LRAD and +2 assist. PT Short Term Goal 4 (Week 1): Patient will negotiate 3 steps with R handrail and maxA. PT Short Term Goal 5 (Week 1): Patient will demonstrate carry over with mobility techniques within session with mod cues.  Skilled Therapeutic Interventions/Progress Updates:   Pt received sitting in w/c, family departing before session. Pt propelled w/c using R hemi technique 2 x 150 ft with S and max verbal cues for sequencing to maintain straight path and avoid obstacles on L. Pt with decreased instances of running into wall on L compared to previous session (2-3x). Pt with no c/o L ankle pain in standing this date. Gait training using R rail in hallway 2 x 25 ft with L AFO donned and therapist facilitating upright posture, lateral weight shifting, placement of LLE to prevent scissoring due to adductor/hamstring tone (pt advancing LLE), approximation at L knee to facilitate WB and knee ext with mod-max A and +2 for w/c follow. Without facilitation from therapist, pt compensates with trunk extension in order to advance LLE. Pt advanced to gait training with use of hemiwalker with same cues as above x 15 ft with max A and +2 for w/c follow. Squat pivot transfer training w/c <> level mat table with focus on head hips relationship, technique, and hand placement, min facilitation for weight shifting. Pt performed bed mobility on mat table to R with supervision to min A for managing LLE and total cues for  sequencing and technique. Pt returned to room and left sitting in w/c with quick release belt on and RN present.   Therapy Documentation Precautions:  Precautions Precautions: Fall Precaution Comments: L inattention, brace on L ankle from recent fx Restrictions Weight Bearing Restrictions: No Pain: Pain Assessment Pain Assessment: No/denies pain Locomotion : Ambulation Ambulation/Gait Assistance: 2: Max assist;3: Mod assist Wheelchair Mobility Distance: 150   See FIM for current functional status  Therapy/Group: Individual Therapy  Kerney ElbeVarner, Fumiye Lubben A 10/25/2014, 2:10 PM

## 2014-10-25 NOTE — Plan of Care (Signed)
Problem: RH BLADDER ELIMINATION Goal: RH STG MANAGE BLADDER WITH ASSISTANCE STG Manage Bladder With Mod I Assistance  Outcome: Progressing  Problem: RH SAFETY Goal: RH STG ADHERE TO SAFETY PRECAUTIONS W/ASSISTANCE/DEVICE STG Adhere to Safety Precautions With min Assistance/Device.  Outcome: Progressing Goal: RH STG DECREASED RISK OF FALL WITH ASSISTANCE STG Decreased Risk of Fall With min Assistance.  Outcome: Progressing

## 2014-10-25 NOTE — Progress Notes (Signed)
Occupational Therapy Weekly Progress Note and Intervention Session  Patient Details  Name: Cory [name] MRN: [id number] Date of Birth: August 08, 1961  Beginning of progress report period: October 19, 2014 End of progress report period: October 26, 2014  Today's Date: 10/26/2014 OT Individual Time: 5784-6962 OT Individual Time Calculation (min): 60 min    Patient has met 1 of 4 short term goals. This week pt has been making slow progress towards goals. Pt continues to decline showering during sessions. Self ROM HEP will begin to be implemented this week in order to increase self awareness of L UE as well as stretching. Pt able to locate items to the L during tasks with max verbal cues and continued education for strategies to locate items. Pt was given elastic laces for shoes in order to increase ease in donning and doffing B shoes. Pt with decreased safety awareness during session regarding location of L UE, locking brakes on wheelchair, and safe feeding strategies. Pt will continue to benefit from skilled OT intervention.  Patient continues to demonstrate the following deficits: safety awareness, functional mobility, functional transfers, self care, L inattention, L UE and L LE weakness, decrease sensation, and dynamic standing balance and therefore will continue to benefit from skilled OT intervention to enhance overall performance with BADL.  Patient progressing toward long term goals..  Continue plan of care.  OT Short Term Goals Week 1:  OT Short Term Goal 1 (Week 1): Pt will perform shower transfer with Mod A in order to increase I in functional transfer. OT Short Term Goal 1 - Progress (Week 1): Not met OT Short Term Goal 2 (Week 1): Pt will perform UB dressing with Mod A in order to increase I in self care. OT Short Term Goal 2 - Progress (Week 1): Met OT Short Term Goal 3 (Week 1): Pt will perform self ROM to L UE with supervision and verbal cues as needed for proper technique. OT  Short Term Goal 3 - Progress (Week 1): Not met OT Short Term Goal 4 (Week 1): Pt will locate 3 items to the L during B & D session in order to increase L attention during functional task. OT Short Term Goal 4 - Progress (Week 1): Not met Week 2:  OT Short Term Goal 1 (Week 2): Pt will move/position arm in safe position during functional transfers and STS with min verbal guidance cues for increased safety awareness. OT Short Term Goal 2 (Week 2): Pt will donn B shoes and socks in order to increase I with LB dressing. OT Short Term Goal 3 (Week 2): Pt will locate 3 items to the L during B and D session with min verbal cues in order to increase attention to L during self care. OT Short Term Goal 4 (Week 2): Pt will perform toilet transfer with Min A in order to increase I in functional transfers  Skilled Therapeutic Interventions/Progress Updates:  Upon entering the room, pt supine in bed with head slightly elevated. Pt with no c/o pain this session and declined shower once again. Supine > sit with Min A as well as Min A squat pivot to wheelchair. OT session with focus on L inattention, self care, hemi dressing techniques, safety awareness, dynamic standing balance, and functional transfers. Pt completing STS x 4 from wheelchair at sink with Min A for balance and to block L knee into extension. Pt continues to require max verbal cues throughout session to lock/unlock breaks when appropriate for safety. Pt searched for ADL  items such as comb, tooth brush, and toothpaste to the L of sink but able to find 1/3 items only. Pt observed to attempt to move chair in an effort to compensate for him not turning head to L. Pt seated in wheelchair with QRB and arm tray donned with call bell and all needed items within reach upon exiting the room.   Therapy Documentation Precautions:  Precautions Precautions: Fall Precaution Comments: L inattention, brace on L ankle from recent fx Restrictions Weight Bearing  Restrictions: No Pain: Pain Assessment Pain Assessment: No/denies pain  See FIM for current functional status  Therapy/Group: Individual Therapy  Phineas Semen 10/25/2014, 4:52 PM

## 2014-10-25 NOTE — Progress Notes (Signed)
[facility]YSICAL [facility]ICINE & REHABILITATION     PROGRESS NOTE    Subjective/Complaints: No complaints. 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  ROS limited due to mental status/affect  Objective: Vital Signs: Blood pressure 123/78, pulse 77, temperature 98.4 F (36.9 C), temperature source Oral, resp. rate 18, weight 77.4 kg (170 lb 10.2 oz), SpO2 100 %. Dg Ankle Complete Left  10/24/2014   CLINICAL DATA:  Fracture, follow-up  EXAM: LEFT ANKLE COMPLETE - 3+ VIEW  COMPARISON:  09/03/2014  FINDINGS: Mild regional soft tissue swelling.  Ankle mortise intact.  Bones appear demineralized.  Bony resorption and periosteal new bone identified at oblique lateral malleolar fracture seen on previous exam.  No additional fracture, dislocation, or bone destruction.  IMPRESSION: Healing oblique lateral malleolar fracture LEFT ankle.   Electronically Signed   By: Ulyses SouthwardMark  Boles M.D.   On: 10/24/2014 11:23    Recent Labs  10/24/14 0750  WBC 6.3  HGB 14.7  HCT 45.2  PLT 238    Recent Labs  10/23/14 0705 10/25/14 0604  NA 139  --   K 4.5  --   CL 105  --   GLUCOSE 93  --   BUN 19  --   CREATININE 0.97 1.00  CALCIUM 9.3  --    CBG (last 3)  No results for input(s): GLUCAP in the last 72 hours.  Wt Readings from Last 3 Encounters:  10/23/14 77.4 kg (170 lb 10.2 oz)  10/15/14 79.289 kg (174 lb 12.8 oz)  09/03/14 86.183 kg (190 lb)    Physical Exam:  Constitutional: He is oriented to person, place, and time. He appears well-developed and well-nourished.  HENT:  Head: Normocephalic and atraumatic.  Eyes: Conjunctivae are normal. Pupils are equal, round, and reactive to light.   Neck: Normal range of motion. Neck supple.  Cardiovascular: Normal rate and regular rhythm.  Respiratory: Effort normal and breath sounds normal. No respiratory distress. He has no wheezes.  GI: Soft. Bowel sounds are normal.  Musculoskeletal: He  exhibits no edema or tenderness. Left ankle splint, no pain with ankle DF/PF Neurological: He is alert and oriented to person, place, and time. Delayed processing.  Left facial weakness with mild dysarthria and left inattention.   Has poor insight and awareness of deficits and easily distracted . Motor strength 0/5 in left deltoid, biceps, triceps, 0/5 to?tr grip, hip flexor, trace knee extensor, 0 ankle dorsiflexion plantar flexion 5/5 on the right side in the same muscle groups.  Absent sensation in the left upper and left lower extremity. Poor sitting balance. Visual fields absent to confrontation testing on left side  Skin: Skin is warm and dry.  Psychiatric: engages more   Assessment/Plan: 1. Functional deficits secondary to right MCA infarct, ?thrombotic, which require 3+ hours per day of interdisciplinary therapy in a comprehensive inpatient rehab setting. Physiatrist is providing close team supervision and 24 hour management of active medical problems listed below. Physiatrist and rehab team continue to assess barriers to discharge/monitor patient progress toward functional and medical goals. FIM: FIM - Bathing Bathing Steps Patient Completed: Chest, Abdomen, Front perineal area, Buttocks, Right upper leg, Left upper leg Bathing: 3: Mod-Patient completes 5-7 193f 10 parts or 50-74%  FIM - Upper Body Dressing/Undressing Upper body dressing/undressing steps patient completed: Put head through opening of pull over shirt/dress, Thread/unthread right sleeve of pullover shirt/dresss, Thread/unthread left sleeve of pullover shirt/dress Upper body dressing/undressing: 4: Min-Patient  completed 75 plus % of tasks FIM - Lower Body Dressing/Undressing Lower body dressing/undressing steps patient completed: Thread/unthread right pants leg, Thread/unthread right underwear leg, Don/Doff left sock, Don/Doff right sock, Don/Doff right shoe Lower body dressing/undressing: 2: Max-Patient completed 25-49% of  tasks  FIM - Toileting Toileting: 1: Total-Patient completed zero steps, helper did all 3 (per Cipriano MileKambi Kelly, NT report)  FIM - Toilet Transfers Toilet Transfers: 2-To toilet/BSC: Max A (lift and lower assist) (per Cipriano MileKambi Kelly, NT report)  FIM - Bed/Chair Transfer Bed/Chair Transfer Assistive Devices: HOB elevated Bed/Chair Transfer: 4: Supine > Sit: Min A (steadying Pt. > 75%/lift 1 leg), 4: Chair or W/C > Bed: Min A (steadying Pt. > 75%), 4: Bed > Chair or W/C: Min A (steadying Pt. > 75%)  FIM - Locomotion: Wheelchair Distance: 150 Locomotion: Wheelchair: 5: Travels 150 ft or more: maneuvers on rugs and over door sills with supervision, cueing or coaxing FIM - Locomotion: Ambulation Locomotion: Ambulation Assistive Devices: Other (comment) (R rail, L AFO) Ambulation/Gait Assistance: 2: Max assist, 1: +2 Total assist Locomotion: Ambulation: 1: Two helpers  Comprehension Comprehension Mode: Auditory Comprehension: 4-Understands basic 75 - 89% of the time/requires cueing 10 - 24% of the time  Expression Expression Mode: Verbal Expression: 3-Expresses basic 50 - 74% of the time/requires cueing 25 - 50% of the time. Needs to repeat parts of sentences.  Social Interaction Social Interaction: 2-Interacts appropriately 25 - 49% of time - Needs frequent redirection.  Problem Solving Problem Solving: 2-Solves basic 25 - 49% of the time - needs direction more than half the time to initiate, plan or complete simple activities  Memory Memory: 2-Recognizes or recalls 25 - 49% of the time/requires cueing 51 - 75% of the time  Medical Problem List and Plan: 1. Functional deficits secondary to R-MCA infarct question embolic v/s thrombotic with resultant left hemiplegia with sensory deficits, left facial weakness, left neglect and dysphagia. 2. DVT Prophylaxis/Anticoagulation: Pharmaceutical: Lovenox 3. Pain Management: tylenol for h/a for now. Trial topamax for post stroke headache, increase  dose 4. Mood: Seems withdrawn--will monitor for now. LCSW to follow for support and evaluation.  5. Neuropsych: This patient is capable of making decisions on his own behalf. 6. Skin/Wound Care: Routine pressure relief measures.  7. Fluids/Electrolytes/Nutrition: Monitor I/O. Follow up admission labs tomorrow 8. HTN: 9. H/o Alcohol abuse/Tobacco use: Will add nicotine patch as asking for his snuff. CIWA protocol. Continue folic acid and thiamine.  10. Dyslipidemia: 11.  Mild HyperK, ? Hemolysis , no obvious medication source, will recheck in am  12.  Hx subacute ankle fx (6-7 wks ago) recheck xrays, no swelling or pain   LOS (Days) 7 A FACE TO FACE EVALUATION WAS PERFORMED  January Bergthold T 10/25/2014 8:13 AM

## 2014-10-26 ENCOUNTER — Inpatient Hospital Stay (HOSPITAL_COMMUNITY): Payer: Medicaid Other | Admitting: Occupational Therapy

## 2014-10-26 DIAGNOSIS — G8190 Hemiplegia, unspecified affecting unspecified side: Secondary | ICD-10-CM

## 2014-10-26 DIAGNOSIS — I63511 Cerebral infarction due to unspecified occlusion or stenosis of right middle cerebral artery: Secondary | ICD-10-CM

## 2014-10-26 DIAGNOSIS — R414 Neurologic neglect syndrome: Secondary | ICD-10-CM

## 2014-10-26 NOTE — Progress Notes (Signed)
Naida SleightStephen Mattson is a 53 y.o. male Feb 28, 1961 [id number]  Subjective: No new complaints. No new problems. Slept well. Feeling OK.  Objective: Vital signs in last 24 hours: Temp:  [98.3 F (36.8 C)-98.5 F (36.9 C)] 98.3 F (36.8 C) (12/05 0622) Pulse Rate:  [64-76] 64 (12/05 0622) Resp:  [18] 18 (12/05 0622) BP: (117-119)/(69-87) 117/69 mmHg (12/05 0622) SpO2:  [95 %-100 %] 100 % (12/05 0622) Weight change:  Last BM Date: 10/21/14  Intake/Output from previous day: 12/04 0701 - 12/05 0700 In: 460 [P.O.:460] Out: 250 [Urine:250] Last cbgs: CBG (last 3)  No results for input(s): GLUCAP in the last 72 hours.   Physical Exam General: No apparent distress; looks sad  HEENT: not dry Lungs: Normal effort. Lungs clear to auscultation, no crackles or wheezes. Cardiovascular: Regular rate and rhythm, no edema Abdomen: S/NT/ND; BS(+) Musculoskeletal:  unchanged Neurological: No new neurological deficits Wounds: dried up dressing on L chest Skin: clear  Aging changes Mental state: Alert, oriented, cooperative R arm IV line    Lab Results: BMET    Component Value Date/Time   NA 139 10/23/2014 0705   K 4.5 10/23/2014 0705   CL 105 10/23/2014 0705   CO2 18* 10/23/2014 0705   GLUCOSE 93 10/23/2014 0705   BUN 19 10/23/2014 0705   CREATININE 1.00 10/25/2014 0604   CALCIUM 9.3 10/23/2014 0705   GFRNONAA 83* 10/25/2014 0604   GFRAA >90 10/25/2014 0604   CBC    Component Value Date/Time   WBC 6.3 10/24/2014 0750   RBC 5.26 10/24/2014 0750   HGB 14.7 10/24/2014 0750   HCT 45.2 10/24/2014 0750   PLT 238 10/24/2014 0750   MCV 85.9 10/24/2014 0750   MCH 27.9 10/24/2014 0750   MCHC 32.5 10/24/2014 0750   RDW 12.3 10/24/2014 0750   LYMPHSABS 2.3 10/24/2014 0750   MONOABS 0.7 10/24/2014 0750   EOSABS 0.2 10/24/2014 0750   BASOSABS 0.0 10/24/2014 0750    Studies/Results: Dg Ankle Complete Left  10/24/2014   CLINICAL DATA:  Fracture, follow-up  EXAM: LEFT ANKLE COMPLETE  - 3+ VIEW  COMPARISON:  09/03/2014  FINDINGS: Mild regional soft tissue swelling.  Ankle mortise intact.  Bones appear demineralized.  Bony resorption and periosteal new bone identified at oblique lateral malleolar fracture seen on previous exam.  No additional fracture, dislocation, or bone destruction.  IMPRESSION: Healing oblique lateral malleolar fracture LEFT ankle.   Electronically Signed   By: Ulyses SouthwardMark  Boles M.D.   On: 10/24/2014 11:23    Medications: I have reviewed the patient's current medications.  Assessment/Plan:  1. Functional deficits secondary to R-MCA infarct question embolic v/s thrombotic with resultant left hemiplegia with sensory deficits, left facial weakness, left neglect and dysphagia. 2. DVT Prophylaxis/Anticoagulation: Pharmaceutical: Lovenox 3. Pain Management: tylenol for h/a for now. Trial topamax for post stroke headache, increase dose 4. Mood: Seems withdrawn--will monitor for now. LCSW to follow for support and evaluation.  5. Neuropsych: This patient is capable of making decisions on his own behalf. 6. Skin/Wound Care: Routine pressure relief measures.  7. Fluids/Electrolytes/Nutrition: Monitor I/O. Follow up admission labs tomorrow 8. HTN: 9. H/o Alcohol abuse/Tobacco use: Will add nicotine patch as asking for his snuff. CIWA protocol. Continue folic acid and thiamine.  10. Dyslipidemia: 11. Mild HyperK, ? Hemolysis , no obvious medication source, will recheck in am  12. Hx subacute ankle fx (6-7 wks ago) recheck xrays, no swelling or pain. X ray shows healing  D/c IV line  Length of stay, days: 8  Sonda PrimesAlex Plotnikov , MD 10/26/2014, 8:58 AM

## 2014-10-27 ENCOUNTER — Inpatient Hospital Stay (HOSPITAL_COMMUNITY): Payer: Medicaid Other

## 2014-10-27 DIAGNOSIS — I63019 Cerebral infarction due to thrombosis of unspecified vertebral artery: Secondary | ICD-10-CM

## 2014-10-27 DIAGNOSIS — I69898 Other sequelae of other cerebrovascular disease: Secondary | ICD-10-CM

## 2014-10-27 DIAGNOSIS — R208 Other disturbances of skin sensation: Secondary | ICD-10-CM

## 2014-10-27 MED ORDER — MINERAL OIL RE ENEM
1.0000 | ENEMA | Freq: Every day | RECTAL | Status: DC | PRN
  Filled 2014-10-27: qty 1

## 2014-10-27 MED ORDER — LINACLOTIDE 145 MCG PO CAPS
145.0000 ug | ORAL_CAPSULE | Freq: Every day | ORAL | Status: DC
  Administered 2014-10-27 – 2014-10-29 (×3): 145 ug via ORAL
  Filled 2014-10-27 (×6): qty 1

## 2014-10-27 NOTE — Plan of Care (Signed)
Problem: RH SAFETY Goal: RH STG DEMO UNDERSTANDING HOME SAFETY PRECAUTIONS Patient will be able to verbalize home safety precautions , no throw rugs , clutter free environment etc.  Outcome: Progressing     

## 2014-10-27 NOTE — Progress Notes (Signed)
Physical Therapy Session Note  Patient Details  Name: Cory [name] MRN: [id number] Date of Birth: 1961/10/17  Today's Date: 10/27/2014 PT Individual Time: 1400-1445 PT Individual Time Calculation (min): 45 min   Short Term Goals: Week 1:  PT Short Term Goal 1 (Week 1): Patient will perform bed mobility from flat surface with modA. PT Short Term Goal 2 (Week 1): Patient will perform functional transfers with modA. PT Short Term Goal 3 (Week 1): Patient will perform gait training 4025' with LRAD and +2 assist. PT Short Term Goal 4 (Week 1): Patient will negotiate 3 steps with R handrail and maxA. PT Short Term Goal 5 (Week 1): Patient will demonstrate carry over with mobility techniques within session with mod cues.  Skilled Therapeutic Interventions/Progress Updates:    Pt received supine in bed, agreeable to participate in therapy. Session focused on gait training, functional mobility. Pt overall modA for SPT w/ max cueing for hand placement, sequencing, and safety. Completed 5' on [place] L1 LE only for reciprocal motor feedback, increased attention to L. Pt initially required MinA to initiate pushing with LLE, progressed to SBA and mod VC's. Gait training 20' w/ hemi-walker and ModA from therapist, +2 for close w/c follow (pt attempted to sit twice suddenly but stood back up with therapist cueing). Therapist provided assist for placing L foot and facilitating L hip flexion during swing phase, along with mod verbal cueing for sequencing. Pt propelled w/c 150' back to room w/ MinA for route finding, avoiding obstacles on L. Pt left seated in w/c w/ quick release belt on and all needs within reach.   Therapy Documentation Precautions:  Precautions Precautions: Fall Precaution Comments: L inattention, brace on L ankle from recent fx Restrictions Weight Bearing Restrictions: No Pain:  No/denies pain  See FIM for current functional status  Therapy/Group: Individual Therapy  Hosie SpangleGodfrey,  Chanel Mcadams 10/27/2014, 2:30 PM

## 2014-10-27 NOTE — Progress Notes (Signed)
Cory SleightStephen [name] is a 53 y.o. male [birth year] [id number]  Subjective:  No new problems. Slept well. Feeling OK. He is constipated  Objective: Vital signs in last 24 hours: Temp:  [98.2 F (36.8 C)-98.7 F (37.1 C)] 98.2 F (36.8 C) (12/06 0545) Pulse Rate:  [75-82] 75 (12/06 0545) Resp:  [18] 18 (12/06 0545) BP: (109-131)/(64-80) 109/64 mmHg (12/06 0545) SpO2:  [100 %] 100 % (12/06 0545) Weight change:  Last BM Date: 10/21/14  Intake/Output from previous day: 12/05 0701 - 12/06 0700 In: 360 [P.O.:360] Out: 1025 [Urine:1025] Last cbgs: CBG (last 3)  No results for input(s): GLUCAP in the last 72 hours.   Physical Exam General: No apparent distress; looks sad  HEENT: not dry Lungs: Normal effort. Lungs clear to auscultation, no crackles or wheezes. Cardiovascular: Regular rate and rhythm, no edema Abdomen: S/NT/ND; BS(+) Musculoskeletal:  unchanged Neurological: No new neurological deficits Wounds: dried up dressing on L chest Skin: clear  Aging changes Mental state: Alert, oriented, cooperative     Lab Results: BMET    Component Value Date/Time   NA 139 10/23/2014 0705   K 4.5 10/23/2014 0705   CL 105 10/23/2014 0705   CO2 18* 10/23/2014 0705   GLUCOSE 93 10/23/2014 0705   BUN 19 10/23/2014 0705   CREATININE 1.00 10/25/2014 0604   CALCIUM 9.3 10/23/2014 0705   GFRNONAA 83* 10/25/2014 0604   GFRAA >90 10/25/2014 0604   CBC    Component Value Date/Time   WBC 6.3 10/24/2014 0750   RBC 5.26 10/24/2014 0750   HGB 14.7 10/24/2014 0750   HCT 45.2 10/24/2014 0750   PLT 238 10/24/2014 0750   MCV 85.9 10/24/2014 0750   MCH 27.9 10/24/2014 0750   MCHC 32.5 10/24/2014 0750   RDW 12.3 10/24/2014 0750   LYMPHSABS 2.3 10/24/2014 0750   MONOABS 0.7 10/24/2014 0750   EOSABS 0.2 10/24/2014 0750   BASOSABS 0.0 10/24/2014 0750    Studies/Results: No results found.  Medications: I have reviewed the patient's current medications.  Assessment/Plan:  1.  Functional deficits secondary to R-MCA infarct question embolic v/s thrombotic with resultant left hemiplegia with sensory deficits, left facial weakness, left neglect and dysphagia. 2. DVT Prophylaxis/Anticoagulation: Pharmaceutical: Lovenox 3. Pain Management: tylenol for h/a for now. Trial topamax for post stroke headache, increase dose 4. Mood: Seems withdrawn--will monitor for now. LCSW to follow for support and evaluation.  5. Neuropsych: This patient is capable of making decisions on his own behalf. 6. Skin/Wound Care: Routine pressure relief measures.  7. Fluids/Electrolytes/Nutrition: Monitor I/O. Follow up admission labs tomorrow 8. HTN: 9. H/o Alcohol abuse/Tobacco use: Will add nicotine patch as asking for his snuff. CIWA protocol. Continue folic acid and thiamine.  10. Dyslipidemia: 11. Mild HyperK, ? Hemolysis , no obvious medication source, will recheck in am  12. Hx subacute ankle fx (6-7 wks ago) recheck xrays, no swelling or pain. X ray shows healing 13. Constipation: see Rx    Length of stay, days: 9  Sonda PrimesAlex Briston Lax , MD 10/27/2014, 8:42 AM

## 2014-10-27 NOTE — Plan of Care (Signed)
Problem: RH BLADDER ELIMINATION Goal: RH STG MANAGE BLADDER WITH ASSISTANCE STG Manage Bladder With Mod I Assistance  Outcome: Progressing  Problem: RH SAFETY Goal: RH STG ADHERE TO SAFETY PRECAUTIONS W/ASSISTANCE/DEVICE STG Adhere to Safety Precautions With min Assistance/Device.  Outcome: Progressing Goal: RH STG DECREASED RISK OF FALL WITH ASSISTANCE STG Decreased Risk of Fall With min Assistance.  Outcome: Progressing Goal: RH STG DEMO UNDERSTANDING HOME SAFETY PRECAUTIONS Patient will be able to verbalize home safety precautions , no throw rugs , clutter free environment etc.  Outcome: Progressing  Problem: RH COGNITION-NURSING Goal: RH STG USES MEMORY AIDS/STRATEGIES W/ASSIST TO PROBLEM SOLVE STG Uses Memory Aids/Strategies With min Assistance to Problem Solve.  Outcome: Progressing Goal: RH STG ANTICIPATES NEEDS/CALLS FOR ASSIST W/ASSIST/CUES STG Anticipates Needs/Calls for Assist With min Assistance/Cues.  Outcome: Progressing  Problem: RH PAIN MANAGEMENT Goal: RH STG PAIN MANAGED AT OR BELOW PT'S PAIN GOAL Less than 2  Outcome: Progressing

## 2014-10-28 ENCOUNTER — Inpatient Hospital Stay (HOSPITAL_COMMUNITY): Payer: Self-pay | Admitting: Rehabilitation

## 2014-10-28 ENCOUNTER — Inpatient Hospital Stay (HOSPITAL_COMMUNITY): Payer: Medicaid Other | Admitting: Speech Pathology

## 2014-10-28 ENCOUNTER — Ambulatory Visit (HOSPITAL_COMMUNITY): Payer: Self-pay | Admitting: Physical Therapy

## 2014-10-28 ENCOUNTER — Encounter (HOSPITAL_COMMUNITY): Payer: Self-pay | Admitting: Occupational Therapy

## 2014-10-28 ENCOUNTER — Encounter (HOSPITAL_COMMUNITY): Payer: Self-pay

## 2014-10-28 NOTE — Progress Notes (Signed)
[facility]ILIT[facility]     PROGRESS NOTE    Subjective/Complaints: Some ankle pain last noc, o/w no c/os ROS limited due to mental status/affect  Objective: Vital Signs: Blood pressure 116/77, pulse 82, temperature 98.7 F (37.1 C), temperature source Oral, resp. rate 18, weight 77.4 kg (170 lb 10.2 oz), SpO2 100 %. No results found. No results for input(s): WBC, HGB, HCT, PLT in the last 72 hours. No results for input(s): NA, K, CL, GLUCOSE, BUN, CREATININE, CALCIUM in the last 72 hours.  Invalid input(s): CO CBG (last 3)  No results for input(s): GLUCAP in the last 72 hours.  Wt Readings from Last 3 Encounters:  10/23/14 77.4 kg (170 lb 10.2 oz)  10/15/14 79.289 kg (174 lb 12.8 oz)  09/03/14 86.183 kg (190 lb)    Physical Exam:  Constitutional: He is oriented to person, place, and time. He appears well-developed and well-nourished.  HENT:  Head: Normocephalic and atraumatic.  Eyes: Conjunctivae are normal. Pupils are equal, round, and reactive to light.   Neck: Normal range of motion. Neck supple.  Cardiovascular: Normal rate and regular rhythm.  Respiratory: Effort normal and breath sounds normal. No respiratory distress. He has no wheezes.  GI: Soft. Bowel sounds are normal.  Musculoskeletal: He exhibits no edema or tenderness.no pain with palp over lat malleolus Neurological: He is alert and oriented to person, place, and time. Delayed processing.  Left facial weakness with mild dysarthria and left inattention.   Has poor insight and awareness of deficits and easily distracted . Motor strength 0/5 in left deltoid, biceps, triceps, 0/5 to?tr grip, hip flexor, trace knee extensor, 0 ankle dorsiflexion plantar flexion 5/5 on the right side in the same muscle groups.  Absent sensation in the left upper and left lower extremity. Poor sitting balance. Visual fields absent to confrontation testing on left side  Skin: Skin is warm and dry.   Psychiatric: engages more   Assessment/Plan: 1. Functional deficits secondary to right MCA infarct, ?thrombotic, which require 3+ hours per day of interdisciplinary therapy in a comprehensive inpatient rehab setting. Physiatrist is providing close team supervision and 24 hour management of active medical problems listed below. Physiatrist and rehab team continue to assess barriers to discharge/monitor patient progress toward functional and medical goals. FIM: FIM - Bathing Bathing Steps Patient Completed: Chest, Abdomen, Left Arm, Front perineal area, Right upper leg, Left upper leg Bathing: 3: Mod-Patient completes 5-7 [id number] 10 parts or 50-74%  FIM - Upper Body Dressing/Undressing Upper body dressing/undressing steps patient completed: Thread/unthread right sleeve of pullover shirt/dresss, Put head through opening of pull over shirt/dress Upper body dressing/undressing: 3: Mod-Patient completed 50-74% of tasks FIM - Lower Body Dressing/Undressing Lower body dressing/undressing steps patient completed: Thread/unthread right underwear leg, Thread/unthread right pants leg, Don/Doff right sock, Don/Doff right shoe Lower body dressing/undressing: 2: Max-Patient completed 25-49% of tasks  FIM - Toileting Toileting: 1: Total-Patient completed zero steps, helper did all 3 (per Cipriano MileKambi Kelly, NT report)  FIM - Toilet Transfers Toilet Transfers: 4-To toilet/BSC: Min A (steadying Pt. > 75%), 4-From toilet/BSC: Min A (steadying Pt. > 75%)  FIM - Bed/Chair Transfer Bed/Chair Transfer Assistive Devices: HOB elevated Bed/Chair Transfer: 3: Sit > Supine: Mod A (lifting assist/Pt. 50-74%/lift 2 legs), 3: Bed > Chair or W/C: Mod A (lift or lower assist)  FIM - Locomotion: Wheelchair Distance: 150 Locomotion: Wheelchair: 4: Travels 150 ft or more: maneuvers on rugs and over door sillls with minimal assistance (Pt.>75%) FIM - Locomotion:  Ambulation Locomotion: Ambulation Assistive Devices: Chief Operating OfficerWalker - Hemi,  Orthosis Ambulation/Gait Assistance: 3: Mod assist, 2: Max assist Locomotion: Ambulation: 1: Two helpers  Comprehension Comprehension Mode: Auditory Comprehension: 4-Understands basic 75 - 89% of the time/requires cueing 10 - 24% of the time  Expression Expression Mode: Verbal Expression: 4-Expresses basic 75 - 89% of the time/requires cueing 10 - 24% of the time. Needs helper to occlude trach/needs to repeat words.  Social Interaction Social Interaction: 3-Interacts appropriately 50 - 74% of the time - May be physically or verbally inappropriate.  Problem Solving Problem Solving: 3-Solves basic 50 - 74% of the time/requires cueing 25 - 49% of the time  Memory Memory: 2-Recognizes or recalls 25 - 49% of the time/requires cueing 51 - 75% of the time  Medical Problem List and Plan: 1. Functional deficits secondary to R-MCA infarct question embolic v/s thrombotic with resultant left hemiplegia with sensory deficits, left facial weakness, left neglect and dysphagia. 2. DVT Prophylaxis/Anticoagulation: Pharmaceutical: Lovenox 3. Pain Management: tylenol for h/a for now. Trial topamax for post stroke headache, increase dose 4. Mood: Seems withdrawn--will monitor for now. LCSW to follow for support and evaluation.  5. Neuropsych: This patient is capable of making decisions on his own behalf. 6. Skin/Wound Care: Routine pressure relief measures.  7. Fluids/Electrolytes/Nutrition: Monitor I/O. Follow up admission labs tomorrow 8. HTN: 9. H/o Alcohol abuse/Tobacco use: Will add nicotine patch as asking for his snuff. CIWA protocol. Continue folic acid and thiamine.  10. Dyslipidemia: 11.  Mild HyperK, ? Hemolysis , no obvious medication source, will recheck in am  12.  Hx subacute ankle fx (6-7 wks ago) recheck xrays, healing oblique fibular fx , non displaced, will need either ankle splint or AFO when OOB   LOS (Days) 10 A FACE TO FACE EVALUATION WAS PERFORMED  KIRSTEINS,ANDREW  E 10/28/2014 7:06 AM

## 2014-10-28 NOTE — Progress Notes (Signed)
Speech Language Pathology Daily Session Note  Patient Details  Name: Cory SleightStephen [name] MRN: [id number] Date of Birth: Jul 29, 1961  Today's Date: 10/28/2014 SLP Individual Time: 0830-0900 SLP Individual Time Calculation (min): 30 min  Short Term Goals: Week 2: SLP Short Term Goal 1 (Week 2): Patient will recall and utilize recommended safe swallow strategies with Dys. 1 textures and nectar-thick liquids with Mod vebral cues to minimize overt s/s of aspiration.  SLP Short Term Goal 2 (Week 2): Patient will demonstrate effecient mastication of Dys. 2 textures with Mod verbal cues for 3 consecutive sessions to demonstrate readiness for texture advancement. SLP Short Term Goal 3 (Week 2): Patient will attend to left of environment with Mod question cues. SLP Short Term Goal 4 (Week 2): Patient will demonstrate sustained attention to a basic task for 10 minutes with Mod verbal cues for redirection. SLP Short Term Goal 5 (Week 2): Patient will label 2 physical and 2 cognitive deficits that have resulted from his CVA with Mod question cues.  SLP Short Term Goal 6 (Week 2): Patient will conume trials of thin liquids with no overt s/s of aspiration over 3 consecutive sessions to demonstrate readiness for a repeat objective swallow assessment.  Skilled Therapeutic Interventions: Skilled treatment session focused on dysphagia and cognitive goals. Upon arrival, patient was sitting upright in bed consuming his breakfast meal with full supervision. Patient had limited PO intake and declined further PO. Patient consumed nectar-thick liquids via cup without overt s/s of aspiration but required Min verbal cues for use of small sips and a slow pace. During the session, the patient consumed a sip of thin liquid via straw from a random cup that was sitting on the bedside table and demonstrated and immediate cough. Patient was reeducated on current swallowing function and diet recommendations. RN also made aware. Patient  participated in a functional conversation with focus on intellectual awareness and was able to identify minimal goals he is working on with OT and PT with Mod question cues but required Max A multimodal cues for intellectual awareness of cognitive deficits. Patient also required supervision verbal cues for use of an increased vocal intensity at the sentence level to increase intelligibility to ~90%. Patient left in bed with all needs within reach and bed alarm on. Continue with current plan of care.    FIM:  Comprehension Comprehension Mode: Auditory Comprehension: 4-Understands basic 75 - 89% of the time/requires cueing 10 - 24% of the time Expression Expression Mode: Verbal Expression: 4-Expresses basic 75 - 89% of the time/requires cueing 10 - 24% of the time. Needs helper to occlude trach/needs to repeat words. Social Interaction Social Interaction: 3-Interacts appropriately 50 - 74% of the time - May be physically or verbally inappropriate. Problem Solving Problem Solving: 3-Solves basic 50 - 74% of the time/requires cueing 25 - 49% of the time Memory Memory: 2-Recognizes or recalls 25 - 49% of the time/requires cueing 51 - 75% of the time FIM - Eating Eating Activity: 5: Supervision/cues  Pain Pain Assessment Pain Assessment: No/denies pain Pain Score: Asleep  Therapy/Group: Individual Therapy  Cory [name] 10/28/2014, 3:20 PM

## 2014-10-28 NOTE — Consult Note (Signed)
  INITIAL DIAGNOSTIC EVALUATION - [name]   MEDICAL NECESSITY:  [name] for an initial diagnostic evaluation owing to the patient's diagnosis of cerebral infarction.   According to medical records, Naida SleightStephen Hacker was admitted to the rehab unit owing to "Functional deficits secondary  to  [facility]CA infarct question embolic v/s thrombotic with resultant left hemiplegia with sensory deficits, left facial weakness, left neglect and dysphagia." Records also indicate that he is a "53 y.o. RH- male with history of HTN, Hyperlipidemia, heavy alcohol use, noncompliance with meds; who was admitted on 10/15/14 with left facial droop, slurred speech, left sided weakness and somnolence. CT head with ischemic changes in R-MCA territory affecting frontal cortex, anterior coronal radiata and dorsal striatum.  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 Carotid dopplers limited due to poor cooperation--R-ICA with 1-30% ICA stenosis. TEE done revealing EF 55-65% with no PFO and increased RA pressure.Therapy evaluations done 11/25 and patient limited by dense L- hemiplegia, poor truncal control with impaired poor safety awareness."   During today's visit, Mr. Cory [name] reported suffering from left-side motor deficits. He denied suffering from any cognitive difficulties. From an emotional standpoint, he described himself to being generally good spirits. He has no reported history of treatment for mental illness. No adjustment issues endorsed. No barriers to therapy identified. Suicidal/homicidal ideation, plan or intent was denied. No manic or hypomanic episodes were reported. The patient denied ever experiencing any auditory/visual hallucinations. No major behavioral or personality changes were endorsed.   PROCEDURES  ADMINISTERED: [1 unit [id number] on 10/28/14] Diagnostic clinical interview  Review of available records  Behavioral Evaluation: Mr. Cory [name] was appropriately dressed for the situation. Normal posture was noted. He was not engaged in this appointment and rapport was difficult to establish. He tended to keep his eyes closed and did not verbally interact much with this examiner. When he did engage, his speech was somewhat slurred. His affect was flat. Attention was adequate. Motivation was poor.    IMPRESSION: Mr. Cory [name] was not very engaged in this appointment. As such, little information could be ascertained about his present mental status. I am concerned that he could be suffering from cognitive deficits post-stroke that he is unaware of though I am not certain that he would engage in cognitive testing. At this time, given his limited engagement, no formal follow-up with neuropsychology will be scheduled. Staff can determine if they wish for us to attempt cognitive screen.   DIAGNOSIS: Right middle cerebral artery stroke      Debbe MountsAdam T. Dyamond Tolosa, Psy.D.  Clinical Neuropsychologist

## 2014-10-28 NOTE — Progress Notes (Signed)
Occupational Therapy Session Note  Patient Details  Name: Cory [name] MRN: [id number] Date of Birth: 12-05-1960  Today's Date: 10/28/2014 OT Individual Time: 1300-1400 OT Individual Time Calculation (min): 60 min    Short Term Goals: Week 1:  OT Short Term Goal 1 (Week 1): Pt will perform shower transfer with Mod A in order to increase I in functional transfer. OT Short Term Goal 1 - Progress (Week 1): Not met OT Short Term Goal 2 (Week 1): Pt will perform UB dressing with Mod A in order to increase I in self care. OT Short Term Goal 2 - Progress (Week 1): Met OT Short Term Goal 3 (Week 1): Pt will perform self ROM to L UE with supervision and verbal cues as needed for proper technique. OT Short Term Goal 3 - Progress (Week 1): Not met OT Short Term Goal 4 (Week 1): Pt will locate 3 items to the L during B & D session in order to increase L attention during functional task. OT Short Term Goal 4 - Progress (Week 1): Not met  Skilled Therapeutic Interventions/Progress Updates:    Pt performed shower this session with dressing and grooming sit to stand at the sink.  He was able to remove his right sock and shoe but needed assistance with the left.  Sit to stand min assist for him to push his pants and underpants down as well.  Stand pivot transfer with mod assist to the tub bench for shower.  He needed total assist to integrate the LUE into the task as well as max instructional cueing to sequence putting soap on the washcloth as well as for thoroughness.  Mod assist for standing balance while he washed his peri area.  Pt with LOB to the left X 2 when attempting to dry his peri area in sitting.  He needed total hand over hand assist for applying lotion to the RUE and left leg during grooming.  Mod demonstrational cueing for donning tank top following hemi techniques as well as min assist for donning pants.  He needed only min facilitation on the LLE to cross it over the right knee for starting  pants and then donning socks.  Therapist provided total assist for donning ankle brace on the LLE as well as AFO and shoe.  He was able to complete the right shoe with supervision.   Therapy Documentation Precautions:  Precautions Precautions: Fall Precaution Comments: L inattention, brace on L ankle from recent fx Restrictions Weight Bearing Restrictions: No  Pain: Pain Assessment Pain Assessment: No/denies pain Pain Score: Asleep ADL: See FIM for current functional status  Therapy/Group: Individual Therapy  Jahnaya Branscome OTR/L 10/28/2014, 2:33 PM

## 2014-10-28 NOTE — Progress Notes (Signed)
Physical Therapy Session Note  Patient Details  Name: Cory [name] MRN: [id number] Date of Birth: 1961/04/06  Today's Date: 10/28/2014 PT Individual Time: 1430-1500 PT Individual Time Calculation (min): 30 min   Short Term Goals: Week 2:  PT Short Term Goal 1 (Week 2): Pt will perform bed mobility with HOB flat and without rails at min A level with min cues for attending to L side of body.  PT Short Term Goal 2 (Week 2): Pt will perform functional transfers with min A and min cues for safety and attention to L side of body.  PT Short Term Goal 3 (Week 2): Pt will self propel w/c 150' using R hemi technique at S level.  PT Short Term Goal 4 (Week 2): Pt will ambulate x 25' w/ LRAD at mod A level   Skilled Therapeutic Interventions/Progress Updates:   Pt received sitting in w/c in room, agreeable to therapy session.  Pt agreeable to working on some gait, despite slight L ankle pain.  Propelled to/from therapy gym x 100' x 2 reps using RLE, as he states increased difficulty when coordinating RUE with LE.  Once in therapy gym, pt then requires max A cues for encouragement to participate in standing or gait due to L ankle pain.  Discussed that we need to at least attempt standing in order to increase WB and strength in LLE.  Pt continues to demonstrate little awareness and states "can't we just work on the other one?"  Educated on need to work on LLE for increase strength and independence with mobility.  Pt continued to decline standing and requires max A cues to even respond to therapist at times due to decreased attention.  Pt then agreeable to working on sitting balance on mat.  Had pt transfer w/c<>therapy mat at min A level.  Then had pt perform mini stand to then sit on dyna disc to work on weight shifts for trunk shortening and strengthening and increased trunk control, esp when moving outside BOS to the L.  Pt able to perform with min to mod facilitation for trunk shortening on the L.   Transferred back to w/c and assisted back to room.  Left in w/c with quick release belt donned and all needs in reach.    Therapy Documentation Precautions:  Precautions Precautions: Fall Precaution Comments: L inattention, brace on L ankle from recent fx Restrictions Weight Bearing Restrictions: No   Vital Signs: Therapy Vitals Temp: 98.4 F (36.9 C) Temp Source: Oral Pulse Rate: 87 Resp: 18 BP: 115/76 mmHg Patient Position (if appropriate): Sitting Oxygen Therapy SpO2: 100 % O2 Device: Not Delivered Pain: Pain Assessment Pain Assessment: No/denies pain Pain Score: Asleep   Locomotion : Wheelchair Mobility Distance: 150   See FIM for current functional status  Therapy/Group: Individual Therapy  [name], [name] 10/28/2014, 4:24 PM

## 2014-10-28 NOTE — Progress Notes (Signed)
Physical Therapy Weekly Progress Note  Patient Details  Name: Cory [name] MRN: [id number] Date of Birth: Sep 07, 1961  Beginning of progress report period: October 19, 2014 End of progress report period: October 28, 2014  Today's Date: 10/28/2014 PT Individual Time: 1100-1200 PT Individual Time Calculation (min): 60 min   Patient has met 5 of 5 short term goals.  Pt making good gains towards in all aspects of mobility.  Continues to be greatly limited by decreased awareness, decreased attention to the L, and decreased safety.  Continues to be very impulsive with mobility, but have started gait training with use of hemi walker and responds well with manual facilitation.  Continue to feel that he will need 24/7 S at D/C and min A for gait, therefore unsure if family able to provide this at D/C.  Will follow CSW for D/C plans.   Patient continues to demonstrate the following deficits: decreased balance, decreased awareness, decreased L attention, decreased functional strength and use of LUE/LE and therefore will continue to benefit from skilled PT intervention to enhance overall performance with activity tolerance, balance, postural control, ability to compensate for deficits, functional use of  left upper extremity and left lower extremity, attention, awareness, coordination and knowledge of precautions.  Patient progressing toward long term goals..  Continue plan of care.  PT Short Term Goals Week 1:  PT Short Term Goal 1 (Week 1): Patient will perform bed mobility from flat surface with modA. PT Short Term Goal 1 - Progress (Week 1): Met PT Short Term Goal 2 (Week 1): Patient will perform functional transfers with modA. PT Short Term Goal 2 - Progress (Week 1): Met PT Short Term Goal 3 (Week 1): Patient will perform gait training 7' with LRAD and +2 assist. PT Short Term Goal 3 - Progress (Week 1): Met PT Short Term Goal 4 (Week 1): Patient will negotiate 3 steps with R handrail and  maxA. PT Short Term Goal 4 - Progress (Week 1): Met PT Short Term Goal 5 (Week 1): Patient will demonstrate carry over with mobility techniques within session with mod cues. PT Short Term Goal 5 - Progress (Week 1): Met Week 2:  PT Short Term Goal 1 (Week 2): Pt will perform bed mobility with HOB flat and without rails at min A level with min cues for attending to L side of body.  PT Short Term Goal 2 (Week 2): Pt will perform functional transfers with min A and min cues for safety and attention to L side of body.  PT Short Term Goal 3 (Week 2): Pt will self propel w/c 150' using R hemi technique at S level.  PT Short Term Goal 4 (Week 2): Pt will ambulate x 25' w/ LRAD at mod A level   Skilled Therapeutic Interventions/Progress Updates:   Pt received lying in bed, agreeable to therapy session.  Performed bed mobility at min A level with HOB elevated with continued cues for attention to LUE and LE during transfer.  Once at EOB, assisted in donning L ankle brace, L AFO and shoes prior to transfer.  Cues for pt to scoot L hip to EOB in order to assist with hip extension.  Transferred to w/c via squat pivot transfer at min A level with continued cues for safety and increased forward weight shift.  Skilled session with clinical specialist in order to work on gait initially with Lite[employer]ait body weight support system, however once assisted into harness pt with continued c/o L ankle pain, therefore  changed plan to address NMR through [place] with quadruped and side propping activities.  Assisted into quadruped with +2 assist.  Pt requires max cues and assist at R leg to prevent forward movement and pushing with R UE/LE.  Therapist on L assisting to maintain trunk control and to keep shoulder, elbow and wrist in alignment for extension and WB.  Note increased flexor tone in L bicep today during activity, therefore worked in quadruped to address this as well as WB.  Performed several weight shifts over BUE.  Pt  demonstrates increased pusher tendencies to the L, esp with RUE.  Pt with increased frustration with activity and would continuously lower body with max cues and encouragement to continue with activity.  Then progressed to propping on extended L arm while performing reaching to increase WB through LLE as well as address trunk shortening/lengthening.  Assisted back into sitting with focus on trunk and LE flex with rotation.  Assisted back to w/c and back to room.  Left in w/c with quick release belt donned and all needs in reach.    Therapy Documentation Precautions:  Precautions Precautions: Fall Precaution Comments: L inattention, brace on L ankle from recent fx Restrictions Weight Bearing Restrictions: No    Therapy Vitals Temp: 98.7 F (37.1 C) Temp Source: Oral Pulse Rate: 82 Resp: 18 BP: 116/77 mmHg Patient Position (if appropriate): Lying Oxygen Therapy SpO2: 100 % O2 Device: Not Delivered Pain: Pain Assessment Pain Assessment: 0-10 Pain Score: 5  Faces Pain Scale: No hurt Pain Type: Acute pain Pain Location: Ankle Pain Orientation: Left Pain Descriptors / Indicators: Aching Pain Intervention(s): Medication (See eMAR)    See FIM for current functional status  Therapy/Group: Individual Therapy (co-tx with clinical specialist)  Denice Bors 10/28/2014, 8:15 AM

## 2014-10-29 ENCOUNTER — Inpatient Hospital Stay (HOSPITAL_COMMUNITY): Payer: Medicaid Other

## 2014-10-29 ENCOUNTER — Inpatient Hospital Stay (HOSPITAL_COMMUNITY): Payer: Medicaid Other | Admitting: Speech Pathology

## 2014-10-29 ENCOUNTER — Ambulatory Visit (HOSPITAL_COMMUNITY): Payer: Self-pay | Admitting: Rehabilitation

## 2014-10-29 MED ORDER — SENNOSIDES-DOCUSATE SODIUM 8.6-50 MG PO TABS
2.0000 | ORAL_TABLET | Freq: Every day | ORAL | Status: DC
  Administered 2014-10-29 – 2014-11-25 (×24): 2 via ORAL
  Filled 2014-10-29 (×36): qty 2

## 2014-10-29 NOTE — Plan of Care (Signed)
Problem: RH BLADDER ELIMINATION Goal: RH STG MANAGE BLADDER WITH ASSISTANCE STG Manage Bladder With Mod I Assistance  Outcome: Progressing  Problem: RH SAFETY Goal: RH STG ADHERE TO SAFETY PRECAUTIONS W/ASSISTANCE/DEVICE STG Adhere to Safety Precautions With min Assistance/Device.  Outcome: Progressing Goal: RH STG DECREASED RISK OF FALL WITH ASSISTANCE STG Decreased Risk of Fall With min Assistance.  Outcome: Progressing Goal: RH STG DEMO UNDERSTANDING HOME SAFETY PRECAUTIONS Patient will be able to verbalize home safety precautions , no throw rugs , clutter free environment etc.  Outcome: Progressing  Problem: RH COGNITION-NURSING Goal: RH STG USES MEMORY AIDS/STRATEGIES W/ASSIST TO PROBLEM SOLVE STG Uses Memory Aids/Strategies With min Assistance to Problem Solve.  Outcome: Progressing Goal: RH STG ANTICIPATES NEEDS/CALLS FOR ASSIST W/ASSIST/CUES STG Anticipates Needs/Calls for Assist With min Assistance/Cues.  Outcome: Progressing  Problem: RH PAIN MANAGEMENT Goal: RH STG PAIN MANAGED AT OR BELOW PT'S PAIN GOAL Less than 2  Outcome: Progressing  Problem: RH KNOWLEDGE DEFICIT Goal: RH STG INCREASE KNOWLEDGE OF HYPERTENSION Patient will be able to verbalize hypertensive medications at discharge.  Outcome: Progressing Goal: RH STG INCREASE KNOWLEDGE OF DYSPHAGIA/FLUID INTAKE Patient and family will be able to verbalize understanding of swallowing precautions  Outcome: Progressing

## 2014-10-29 NOTE — Progress Notes (Signed)
Physical Therapy Session Note  Patient Details  Name: Cory SleightStephen Defenbaugh MRN: [id number] Date of Birth: [birth year]  Today's Date: 10/29/2014 PT Individual Time: 0830-0930 PT Individual Time Calculation (min): 60 min   Short Term Goals: Week 2:  PT Short Term Goal 1 (Week 2): Pt will perform bed mobility with HOB flat and without rails at min A level with min cues for attending to L side of body.  PT Short Term Goal 2 (Week 2): Pt will perform functional transfers with min A and min cues for safety and attention to L side of body.  PT Short Term Goal 3 (Week 2): Pt will self propel w/c 150' using R hemi technique at S level.  PT Short Term Goal 4 (Week 2): Pt will ambulate x 25' w/ LRAD at mod A level   Skilled Therapeutic Interventions/Progress Updates:   Pt received lying in bed, receiving meds from RN.  Assisted with donning L ankle brace while still in bed.  Pt continues to state to PT and RN "I could get up and move if I just had my crutches."  Continue to have lengthy education on awareness with questioning cues to assist.  Pt performed bed mobility at S level with HOB flat and without rails to better simulate home and to further challenge pts awareness.  He continues to struggle with LUE/LE when getting out of bed.  When questioned why he didn't use LUE to assist LLE out of bed, he states "I already had it in my lap." Once at EOB, assisted with donning L shoe with AFO, but had pt don R shoe and attempt to tie shoe.  Pt attempted for several minutes and was unable to state that he couldn't do it, but finally agreed to help from therapist when asked for a second time.  Pt then transferred to w/c via squat pivot transfer at min A level.  RT in during session to focus on increasing awareness of deficits (intellectual awareness) and safety awareness.  Provided pt with set of crutches in order for pt to problem solve through how to stand and use them.  Pt attempted to stand several times and continued  to fall to the L with min A to ensure he remained on mat.  He continued to state that he wanted to keep trying, making excuses that "my crutches at home are fit perfect for me."  RT then utilized ipad to record pt trying to use crutches and pt finally able to state after watching video in response to therapist question that "my L side is too weak."  Then provided futher education on why we want him to use the hemi walker for gait at this time to utilize his stronger side.  Attempted gait x 6' with hemi walker at max to total A level.  He continues to attempt to step RLE too soon or keep both LEs flexed and then "hanging" on therapist.  Had RT record pt during gait for further awareness on issues during gait.  Pt then able to state, "I move my R leg too soon."  Pt then self propelled back to room.  Will assess carryover of awareness deficits during next visit.  Left with quick release belt donned and all needs in reach.    Therapy Documentation Precautions:  Precautions Precautions: Fall Precaution Comments: L inattention, brace on L ankle from recent fx Restrictions Weight Bearing Restrictions: No   Pain: Pain Assessment Pain Assessment: 0-10 Pain Score: 3  Pain Type: Acute  pain Pain Location: Ankle Pain Orientation: Left Pain Descriptors / Indicators: Aching Pain Frequency: Constant Pain Intervention(s): Medication (See eMAR)   Locomotion : Ambulation Ambulation/Gait Assistance: 2: Max assist;1: +2 Total assist Wheelchair Mobility Distance: 150   See FIM for current functional status  Therapy/Group: Individual Therapy  [name], Ardenia Stiner Ann 10/29/2014, 10:30 AM

## 2014-10-29 NOTE — Progress Notes (Signed)
[facility] PHYSICAL MEDICINE & REHABILITATION     PROGRESS NOTE    Subjective/Complaints: Pt note Left ankle pain only with WB    Appreciate Neuro Psych eval  ROS limited due to mental status/affect  Objective: Vital Signs: Blood pressure 114/75, pulse 80, temperature 98.5 F (36.9 C), temperature source Oral, resp. rate 16, weight 77.4 kg (170 lb 10.2 oz), SpO2 98 %. No results found. No results for input(s): WBC, HGB, HCT, PLT in the last 72 hours. No results for input(s): NA, K, CL, GLUCOSE, BUN, CREATININE, CALCIUM in the last 72 hours.  Invalid input(s): CO CBG (last 3)  No results for input(s): GLUCAP in the last 72 hours.  Wt Readings from Last 3 Encounters:  10/23/14 77.4 kg (170 lb 10.2 oz)  10/15/14 79.289 kg (174 lb 12.8 oz)  09/03/14 86.183 kg (190 lb)    Physical Exam:  Constitutional: He is oriented to person, place, and time. He appears well-developed and well-nourished.  HENT:  Head: Normocephalic and atraumatic.  Eyes: Conjunctivae are normal. Pupils are equal, round, and reactive to light.   Neck: Normal range of motion. Neck supple.  Cardiovascular: Normal rate and regular rhythm.  Respiratory: Effort normal and breath sounds normal. No respiratory distress. He has no wheezes.  GI: Soft. Bowel sounds are normal.  Musculoskeletal: He exhibits no edema or tenderness.no pain with palp over lat malleolus Neurological: He is alert and oriented to person, place, and time. Delayed processing.  Left facial weakness with mild dysarthria and left inattention.   Has poor insight and awareness of deficits and easily distracted . Motor strength 0/5 in left deltoid, biceps, triceps, 0/5 to?tr grip, hip flexor, trace knee extensor, 0 ankle dorsiflexion plantar flexion 5/5 on the right side in the same muscle groups.  Absent sensation in the left upper and left lower extremity.  Visual fields absent to confrontation testing on left side  Skin: Skin is warm and dry.   Psychiatric: engages more   Assessment/Plan: 1. Functional deficits secondary to right MCA infarct, ?thrombotic, which require 3+ hours per day of interdisciplinary therapy in a comprehensive inpatient rehab setting. Physiatrist is providing close team supervision and 24 hour management of active medical problems listed below. Physiatrist and rehab team continue to assess barriers to discharge/monitor patient progress toward functional and medical goals. FIM: FIM - Bathing Bathing Steps Patient Completed: Chest, Left Arm, Abdomen, Front perineal area, Right upper leg, Left upper leg, Right lower leg (including foot) Bathing: 4: Min-Patient completes 8-9 [id number] 10 parts or 75+ percent  FIM - Upper Body Dressing/Undressing Upper body dressing/undressing steps patient completed: Thread/unthread right sleeve of pullover shirt/dresss, Put head through opening of pull over shirt/dress Upper body dressing/undressing: 3: Mod-Patient completed 50-74% of tasks FIM - Lower Body Dressing/Undressing Lower body dressing/undressing steps patient completed: Thread/unthread right pants leg, Don/Doff right sock, Don/Doff right shoe, Thread/unthread left pants leg, Don/Doff left sock Lower body dressing/undressing: 3: Mod-Patient completed 50-74% of tasks  FIM - Toileting Toileting: 1: Total-Patient completed zero steps, helper did all 3 (per Cipriano MileKambi Kelly, NT report)  FIM - Toilet Transfers Toilet Transfers: 4-To toilet/BSC: Min A (steadying Pt. > 75%), 4-From toilet/BSC: Min A (steadying Pt. > 75%)  FIM - Bed/Chair Transfer Bed/Chair Transfer Assistive Devices: Arm rests Bed/Chair Transfer: 4: Bed > Chair or W/C: Min A (steadying Pt. > 75%), 4: Chair or W/C > Bed: Min A (steadying Pt. > 75%)  FIM - Locomotion: Wheelchair Distance: 150 Locomotion: Wheelchair: 5: Travels 150 ft  or more: maneuvers on rugs and over door sills with supervision, cueing or coaxing FIM - Locomotion: Ambulation Locomotion:  Ambulation Assistive Devices: Chief Operating OfficerWalker - Hemi, Orthosis Ambulation/Gait Assistance: 3: Mod assist, 2: Max assist Locomotion: Ambulation: 0: Activity did not occur  Comprehension Comprehension Mode: Auditory Comprehension: 4-Understands basic 75 - 89% of the time/requires cueing 10 - 24% of the time  Expression Expression Mode: Verbal Expression: 4-Expresses basic 75 - 89% of the time/requires cueing 10 - 24% of the time. Needs helper to occlude trach/needs to repeat words.  Social Interaction Social Interaction: 3-Interacts appropriately 50 - 74% of the time - May be physically or verbally inappropriate.  Problem Solving Problem Solving: 4-Solves basic 75 - 89% of the time/requires cueing 10 - 24% of the time  Memory Memory: 4-Recognizes or recalls 75 - 89% of the time/requires cueing 10 - 24% of the time  Medical Problem List and Plan: 1. Functional deficits secondary to R-MCA infarct question embolic v/s thrombotic with resultant left hemiplegia with sensory deficits, left facial weakness, left neglect and dysphagia. 2. DVT Prophylaxis/Anticoagulation: Pharmaceutical: Lovenox 3. Pain Management: tylenol for h/a for now. Trial topamax for post stroke headache, increase dose 4. Mood: Seems withdrawn--will monitor for now. LCSW to follow for support and evaluation.  5. Neuropsych: This patient is capable of making decisions on his own behalf. 6. Skin/Wound Care: Routine pressure relief measures.  7. Fluids/Electrolytes/Nutrition: Monitor I/O. Follow up admission labs tomorrow 8. HTN: 9. H/o Alcohol abuse/Tobacco use: Will add nicotine patch as asking for his snuff. CIWA protocol. Continue folic acid and thiamine.  10. Dyslipidemia: 11.  Mild HyperK, ? Hemolysis , no obvious medication source, will recheck in am  12.  Hx subacute ankle fx (7-8 wks ago) recheck xrays, healing oblique fibular fx , non displaced, will need either ankle splint or AFO when OOB, f/u ortho (Dr Ophelia CharterYates) as  outpt   LOS (Days) 11 A FACE TO FACE EVALUATION WAS PERFORMED  Claudette LawsKIRSTEINS,Chanan Detwiler E 10/29/2014 7:24 AM

## 2014-10-29 NOTE — Progress Notes (Signed)
Speech Language Pathology Daily Session Note  Patient Details  Name: Cory SleightStephen Needs MRN: [id number] Date of Birth: 02-Sep-1961  Today's Date: 10/29/2014 SLP Individual Time: 1410-1510 SLP Individual Time Calculation (min): 60 min  Short Term Goals: Week 2: SLP Short Term Goal 1 (Week 2): Patient will recall and utilize recommended safe swallow strategies with Dys. 1 textures and nectar-thick liquids with Mod vebral cues to minimize overt s/s of aspiration.  SLP Short Term Goal 2 (Week 2): Patient will demonstrate effecient mastication of Dys. 2 textures with Mod verbal cues for 3 consecutive sessions to demonstrate readiness for texture advancement. SLP Short Term Goal 3 (Week 2): Patient will attend to left of environment with Mod question cues. SLP Short Term Goal 4 (Week 2): Patient will demonstrate sustained attention to a basic task for 10 minutes with Mod verbal cues for redirection. SLP Short Term Goal 5 (Week 2): Patient will label 2 physical and 2 cognitive deficits that have resulted from his CVA with Mod question cues.  SLP Short Term Goal 6 (Week 2): Patient will conume trials of thin liquids with no overt s/s of aspiration over 3 consecutive sessions to demonstrate readiness for a repeat objective swallow assessment.  Skilled Therapeutic Interventions: Skilled treatment session focused on addressing dysphagia and cognitive goals. Patient consumed Dys. 2 textures trials with nectar-thick liquids via cup with Max multimodal cues for pacing and portion control with textures and Min verbal cues for use of portion control with sips which was effective at preventing overt s/s of aspiration.  SLP also facilitated session with 2 planned failure tasks in conjunction with a functional conversation with focus on intellectual awareness and was able to identify deficits with Mod question cues for physical deficits and Max multimodal cues for intellectual awareness of cognitive deficits. Patient  also required Max multimodal cues for basic problem solving with route finding to room and use of daily schedule.  Continue with current plan of care.    FIM:  Comprehension Comprehension Mode: Auditory Comprehension: 3-Understands basic 50 - 74% of the time/requires cueing 25 - 50%  of the time Expression Expression Mode: Verbal Expression: 4-Expresses basic 75 - 89% of the time/requires cueing 10 - 24% of the time. Needs helper to occlude trach/needs to repeat words. Social Interaction Social Interaction: 3-Interacts appropriately 50 - 74% of the time - May be physically or verbally inappropriate. Problem Solving Problem Solving: 2-Solves basic 25 - 49% of the time - needs direction more than half the time to initiate, plan or complete simple activities Memory Memory: 2-Recognizes or recalls 25 - 49% of the time/requires cueing 51 - 75% of the time FIM - Eating Eating Activity: 4: Helper checks for pocketed food;5: Needs verbal cues/supervision  Pain Pain Assessment Pain Assessment: No/denies pain  Therapy/Group: Individual Therapy  Charlane FerrettiMelissa Mackenize Delgadillo, M.A., CCC-SLP [phone]  Cindee Mclester 10/29/2014, 3:43 PM

## 2014-10-29 NOTE — Progress Notes (Signed)
Recreational Therapy Session Note  Patient Details  Name: Cory [name] MRN: [id number] Date of Birth: 28-Feb-1961 Today's Date: 10/29/2014  Pain: c/o intermittent L ankle pain, premedicated Skilled Therapeutic Interventions/Progress Updates: Session focused on activity tolerance, standing balance, ambulation, intellectual awareness, emergent awareness during planned failures.  Pt continually stating he could stand and/or walk if he had crutches.  Lengthy discussion/education on awareness of deficits.  Provided pt with crutches as a planned failure once in the therapy gym.  Pt performed squat pivot transfer with min assist.  Pt attempted to stand with crutches but unable requiring min assist to maintain safety.  Utilized IPAD to record pts attempts to stand using crutches and later to ambulate with hemi-walker to assist with awareness.  Once pt viewed the video of himself, pt able to state "my left side is too weak" and "i move my right leg too soon" during ambulation.  Videos erased from IPAD after continued discussion on awareness & overall safety with mobility.  Pt performed w/c mobility from gym to room with supervision, mod cues for safety.  Therapy/Group: Co-Treatment  Teyona Nichelson 10/29/2014, 4:35 PM

## 2014-10-29 NOTE — Progress Notes (Signed)
Occupational Therapy Session Note  Patient Details  Name: Cory SleightStephen Hypes MRN: [id number] Date of Birth: [birth year]  Today's Date: 10/29/2014 OT Individual Time: 1000-1100 OT Individual Time Calculation (min): 60 min    Short Term Goals: Week 2:  OT Short Term Goal 1 (Week 2): Pt will move/position arm in safe position during functional transfers and STS with min verbal guidance cues for increased safety awareness. OT Short Term Goal 2 (Week 2): Pt will donn B shoes and socks in order to increase I with LB dressing. OT Short Term Goal 3 (Week 2): Pt will locate 3 items to the L during B and D session with min verbal cues in order to increase attention to L during self care. OT Short Term Goal 4 (Week 2): Pt will perform toilet transfer with Min A in order to increase I in functional transfers  Skilled Therapeutic Interventions/Progress Updates:    Pt engaged in BADL retraining including bathing at shower level and dressing with sit<>stand from w/c at sink.  Pt required mod verbal cues for attention to left throughout session.  Pt required min verbal cues for safety awareness with transitional movements.  Pt required max verbal cues for LLE positioning with sit<>stand and standing balance.  Pt required mod verbal cues to locate soap when placed in left field of vision.  Pt was able to don Rt sock and shoe but required assistance with Lt sock and shoe.  Pt required mod A for standing balance when pulling up pants.  Focus on activity tolerance, attention to left, sit<>stand, standing balance, transfers, and safety awareness.  Therapy Documentation Precautions:  Precautions Precautions: Fall Precaution Comments: L inattention, brace on L ankle from recent fx Restrictions Weight Bearing Restrictions: No    Pain: Pain Assessment Pain Assessment: 0-10 Pain Score: 3  Pain Type: Acute pain Pain Location: Ankle Pain Orientation: Left Pain Descriptors / Indicators: Aching Pain Frequency:  Constant Pain Intervention(s): Medication (See eMAR)  See FIM for current functional status  Therapy/Group: Individual Therapy  Rich BraveLanier, Zaquan Duffner Chappell 10/29/2014, 11:04 AM

## 2014-10-30 ENCOUNTER — Inpatient Hospital Stay (HOSPITAL_COMMUNITY): Payer: Medicaid Other | Admitting: Speech Pathology

## 2014-10-30 ENCOUNTER — Inpatient Hospital Stay (HOSPITAL_COMMUNITY): Payer: Medicaid Other | Admitting: Occupational Therapy

## 2014-10-30 ENCOUNTER — Ambulatory Visit (HOSPITAL_COMMUNITY): Payer: Self-pay | Admitting: Rehabilitation

## 2014-10-30 DIAGNOSIS — M25552 Pain in left hip: Secondary | ICD-10-CM

## 2014-10-30 DIAGNOSIS — G8114 Spastic hemiplegia affecting left nondominant side: Secondary | ICD-10-CM

## 2014-10-30 LAB — BASIC METABOLIC PANEL
Anion gap: 16 — ABNORMAL HIGH (ref 5–15)
BUN: 22 mg/dL (ref 6–23)
CALCIUM: 9.5 mg/dL (ref 8.4–10.5)
CO2: 18 mEq/L — ABNORMAL LOW (ref 19–32)
Chloride: 104 mEq/L (ref 96–112)
Creatinine, Ser: 0.94 mg/dL (ref 0.50–1.35)
GFR calc Af Amer: >90 mL/min (ref >90)
GLUCOSE: 117 mg/dL — AB (ref 70–99)
Potassium: 3.9 mEq/L (ref 3.7–5.3)
SODIUM: 138 meq/L (ref 137–147)

## 2014-10-30 MED ORDER — TIZANIDINE HCL 2 MG PO TABS
2.0000 mg | ORAL_TABLET | Freq: Three times a day (TID) | ORAL | Status: DC
  Administered 2014-10-30 – 2014-11-28 (×88): 2 mg via ORAL
  Filled 2014-10-30 (×93): qty 1

## 2014-10-30 MED ORDER — TOPIRAMATE 25 MG PO TABS
25.0000 mg | ORAL_TABLET | Freq: Two times a day (BID) | ORAL | Status: DC
  Administered 2014-10-30 – 2014-11-04 (×12): 25 mg via ORAL
  Filled 2014-10-30 (×15): qty 1

## 2014-10-30 NOTE — Plan of Care (Signed)
Problem: RH PAIN MANAGEMENT Goal: RH STG PAIN MANAGED AT OR BELOW PT'S PAIN GOAL Less than 2  Outcome: Progressing

## 2014-10-30 NOTE — Progress Notes (Signed)
[place] PHYSICAL MEDICINE & REHABILITATION     PROGRESS NOTE    Subjective/Complaints: No issues overnite, pt with some Left hip and thigh pain   ROS limited due to mental status/affect  Objective: Vital Signs: Blood pressure 102/68, pulse 73, temperature 98.4 F (36.9 C), temperature source Oral, resp. rate 16, weight 77.4 kg (170 lb 10.2 oz), SpO2 100 %. No results found. No results for input(s): WBC, HGB, HCT, PLT in the last 72 hours.  Recent Labs  10/30/14 0305  NA 138  K 3.9  CL 104  GLUCOSE 117*  BUN 22  CREATININE 0.94  CALCIUM 9.5   CBG (last 3)  No results for input(s): GLUCAP in the last 72 hours.  Wt Readings from Last 3 Encounters:  10/23/14 77.4 kg (170 lb 10.2 oz)  10/15/14 79.289 kg (174 lb 12.8 oz)  09/03/14 86.183 kg (190 lb)    Physical Exam:  Constitutional: He is oriented to person, place, and time. He appears well-developed and well-nourished.  HENT:  Head: Normocephalic and atraumatic.  Eyes: Conjunctivae are normal. Pupils are equal, round, and reactive to light.   Neck: Normal range of motion. Neck supple.  Cardiovascular: Normal rate and regular rhythm.  Respiratory: Effort normal and breath sounds normal. No respiratory distress. He has no wheezes.  GI: Soft. Bowel sounds are normal.  Musculoskeletal: He exhibits mild pain with Left hip Int/ext rotation Neurological: He is alert and oriented to person, place, and time. Delayed processing.  Left facial weakness with mild dysarthria and left inattention.   Has poor insight and awareness of deficits and easily distracted . Motor strength 0/5 in left deltoid, biceps, triceps, 0/5 to?tr grip, hip flexor, trace knee extensor, 0 ankle dorsiflexion plantar flexion MAS 3 L hamstrings 5/5 on the right side in the same muscle groups.  Absent sensation in the left upper and left lower extremity.  Visual fields absent to confrontation testing on left side  Skin: Skin is warm and dry.   Psychiatric: engages more   Assessment/Plan: 1. Functional deficits secondary to right MCA infarct, ?thrombotic, which require 3+ hours per day of interdisciplinary therapy in a comprehensive inpatient rehab setting. Physiatrist is providing close team supervision and 24 hour management of active medical problems listed below. Physiatrist and rehab team continue to assess barriers to discharge/monitor patient progress toward functional and medical goals. Team conference today please see physician documentation under team conference tab, met with team face-to-face to discuss problems,progress, and goals. Formulized individual treatment plan based on medical history, underlying problem and comorbidities. FIM: FIM - Bathing Bathing Steps Patient Completed: Chest, Left Arm, Abdomen, Front perineal area, Right upper leg, Left upper leg, Right lower leg (including foot) Bathing: 3: Mod-Patient completes 5-7 [id number] parts or 50-74%  FIM - Upper Body Dressing/Undressing Upper body dressing/undressing steps patient completed: Thread/unthread right sleeve of pullover shirt/dresss, Put head through opening of pull over shirt/dress Upper body dressing/undressing: 3: Mod-Patient completed 50-74% of tasks FIM - Lower Body Dressing/Undressing Lower body dressing/undressing steps patient completed: Thread/unthread right pants leg, Don/Doff right sock, Don/Doff right shoe, Thread/unthread left pants leg, Don/Doff left sock, Thread/unthread right underwear leg Lower body dressing/undressing: 3: Mod-Patient completed 50-74% of tasks  FIM - Toileting Toileting: 1: Total-Patient completed zero steps, helper did all 3 (per KGerrit Halls NT report)  FIM - Toilet Transfers Toilet Transfers: 4-To toilet/BSC: Min A (steadying Pt. > 75%), 4-From toilet/BSC: Min A (steadying Pt. > 75%)  FIM - BControl and instrumentation engineer  Devices: Arm rests Bed/Chair Transfer: 5: Supine > Sit: Supervision (verbal  cues/safety issues), 4: Bed > Chair or W/C: Min A (steadying Pt. > 75%)  FIM - Locomotion: Wheelchair Distance: 150 Locomotion: Wheelchair: 5: Travels 150 ft or more: maneuvers on rugs and over door sills with supervision, cueing or coaxing FIM - Locomotion: Ambulation Locomotion: Ambulation Assistive Devices: Museum/gallery curator, Orthosis Ambulation/Gait Assistance: 2: Max assist, 1: +2 Total assist Locomotion: Ambulation: 1: Two helpers  Comprehension Comprehension Mode: Auditory Comprehension: 3-Understands basic 50 - 74% of the time/requires cueing 25 - 50%  of the time  Expression Expression Mode: Verbal Expression: 4-Expresses basic 75 - 89% of the time/requires cueing 10 - 24% of the time. Needs helper to occlude trach/needs to repeat words.  Social Interaction Social Interaction: 3-Interacts appropriately 50 - 74% of the time - May be physically or verbally inappropriate.  Problem Solving Problem Solving: 2-Solves basic 25 - 49% of the time - needs direction more than half the time to initiate, plan or complete simple activities  Memory Memory: 2-Recognizes or recalls 25 - 49% of the time/requires cueing 51 - 75% of the time  Medical Problem List and Plan: 1. Functional deficits secondary to R-MCA infarct question embolic v/s thrombotic with resultant left hemiplegia with sensory deficits, left facial weakness, left neglect and dysphagia. 2. DVT Prophylaxis/Anticoagulation: Pharmaceutical: Lovenox 3. Pain Management: tylenol for h/a for now.  topamax for post stroke headache, improved will reduce dose 4. Mood: Seems withdrawn--will monitor for now. LCSW to follow for support and evaluation.  5. Neuropsych: This patient is capable of making decisions on his own behalf. 6. Skin/Wound Care: Routine pressure relief measures.  7. Fluids/Electrolytes/Nutrition: Monitor I/O. Follow up admission labs tomorrow 8. HTN: 9. H/o Alcohol abuse/Tobacco use: Will add nicotine patch as  asking for his snuff. CIWA protocol. Continue folic acid and thiamine.  10. Dyslipidemia: 11.  Mild HyperK, ? Hemolysis , no obvious medication source, will recheck in am  12.  Hx subacute ankle fx (7-8 wks ago) recheck xrays, healing oblique fibular fx , non displaced, will need either ankle splint or AFO when OOB, f/u ortho (Dr Lorin Mercy) as outpt 13.  Spasticity increasing primarily L hamstring, start zanaflex, monitor BP, consier botox given focal nature and severity  LOS (Days) 12 A FACE TO FACE EVALUATION WAS PERFORMED  Agueda Houpt E 10/30/2014 7:46 AM

## 2014-10-30 NOTE — Plan of Care (Signed)
Problem: RH Balance Goal: LTG Patient will maintain dynamic standing balance (PT) LTG: Patient will maintain dynamic standing balance with assistance during mobility activities (PT)  Standing balance goal downgraded due to decreased safety awareness and poor balance.   Problem: RH Bed to Chair Transfers Goal: LTG Patient will perform bed/chair transfers w/assist (PT) LTG: Patient will perform bed/chair transfers with assistance, with/without cues (PT).  Downgraded due to pts poor safety awareness  Problem: RH Car Transfers Goal: LTG Patient will perform car transfers with assist (PT) LTG: Patient will perform car transfers with assistance (PT).  Outcome: Not Applicable Date Met:  11/09/74 D/C'd goal due to D/C to SNF  Problem: RH Furniture Transfers Goal: LTG Patient will perform furniture transfers w/assist (OT/PT LTG: Patient will perform furniture transfers with assistance (OT/PT).  Outcome: Not Applicable Date Met:  88/32/54 D/C'd due to new D/C plan of SNF  Problem: RH Ambulation Goal: LTG Patient will ambulate in controlled environment (PT) LTG: Patient will ambulate in a controlled environment, # of feet with assistance (PT).  Downgraded due to pts increased tone in LLE, decreased balance, and decreased safety awareness.  Goal: LTG Patient will ambulate in home environment (PT) LTG: Patient will ambulate in home environment, # of feet with assistance (PT).  Outcome: Not Applicable Date Met:  98/26/41 DC'd home ambulation goals due to new D/C plan of SNF.   Problem: RH Wheelchair Mobility Goal: LTG Patient will propel w/c in home environment (PT) LTG: Patient will propel wheelchair in home environment, # of feet with assistance (PT).  Outcome: Not Applicable Date Met:  58/30/94 D/C'd home w/c mobility goal due to new D/C plan of SNF.  Goal: LTG Patient will propel w/c in community environment (PT) LTG: Patient will propel wheelchair in community environment, # of feet with  assist (PT)  Outcome: Not Applicable Date Met:  07/68/08 D/C'd community ambulation goals due to D/C plan to SNF.   Problem: RH Stairs Goal: LTG Patient will ambulate up and down stairs w/assist (PT) LTG: Patient will ambulate up and down # of stairs with assistance (PT)  Outcome: Not Applicable Date Met:  81/10/31 D/C'd stair goal due to new D/C plan of SNF.   Problem: RH Memory Goal: LTG Patient demonstrate ability for day to day recall (PT) LTG: Patient will demonstrate ability for day to day recall/carryover during mobility activities with assist (PT)  Downgraded due to poor awareness and memory.

## 2014-10-30 NOTE — Progress Notes (Signed)
Physical Therapy Session Note  Patient Details  Name: Cory [name] MRN: [id number] Date of Birth: [birth year]  Today's Date: 10/30/2014 PT Individual Time: 1400-1505 PT Individual Time Calculation (min): 65 min   Short Term Goals: Week 2:  PT Short Term Goal 1 (Week 2): Pt will perform bed mobility with HOB flat and without rails at min A level with min cues for attending to L side of body.  PT Short Term Goal 2 (Week 2): Pt will perform functional transfers with min A and min cues for safety and attention to L side of body.  PT Short Term Goal 3 (Week 2): Pt will self propel w/c 150' using R hemi technique at S level.  PT Short Term Goal 4 (Week 2): Pt will ambulate x 25' w/ LRAD at mod A level   Skilled Therapeutic Interventions/Progress Updates:   Pt received sitting in w/c in room, agreeable to therapy session.  RT joined first half of session in order to focus on standing in standing frame while completing pipe tree activity in order to address upright and midline posture, scanning L environment, increased WB through LLE in extension to decrease tone, sustained attention, problem solving and memory.  Provided light facilitation for posture and increased weight bearing on LLE during standing, but overall did well achieving midline with body.  Tends to keep trunk rotated L and head tilted to the R, turned to the L, therefore provided assist for this intermittently as well.  Pt requires mod to max verbal cues to locate correct pieces and despite having correctly shaped pipes, would not match up correct size, needing total A cues to search L for these pieces.  Second half of session focused on midline posture while maintaining low stance (squat position) with use of mirror for visual feedback on posture and focus on increasing weight shift and WB through LLE.  Pt requires +2 assist for maintaining L knee positioning, upright posture, trunk alignment and second therapist assisting with keeping R  hip and knee flexed to and to decrease R LE activation.  Had pt keep RUE across chest to decrease pushing.  Also requires max to total A cues for self correcting posture.  Family present near end of session (brother and sister) and note this was very distracting for pt, therefore encouraged them to remain present but to stay to his left and remain quiet for decreased distraction.  Moved to standing in front of mat, placing RLE (knee) up to mat table to further increase WB and quad/glute activation on LLE.  Provided tapping at L quad for activation and pt was able to activation L quad for short time, but unable to maintain.  Then worked with L knee on mat activating glute to bring hip over knee.  Again, did note trace active movement in glute, but only able to maintain 4-5 seconds at a time.  Pt assisted back to hallway with sister assisting remainder of distance to room.  L half lap tray donned and quick release belt donned.  Sister requesting grounds pass, therefore PA notified.  Educated sister that he has to keep belt on and that he is NOT allowed to get up when outside.  Sister verbalized understanding.    Therapy Documentation Precautions:  Precautions Precautions: Fall Precaution Comments: L inattention, brace on L ankle from recent fx Restrictions Weight Bearing Restrictions: No   Pain:Pt with mild c/o pain in L ankle, adjusted tx based on pain.   See FIM for current functional status  Therapy/Group: Individual Therapy and Co-Treatment  [name], David Towson Ann 10/30/2014, 3:34 PM

## 2014-10-30 NOTE — Plan of Care (Signed)
Problem: RH SAFETY Goal: RH STG ADHERE TO SAFETY PRECAUTIONS W/ASSISTANCE/DEVICE STG Adhere to Safety Precautions With min Assistance/Device.  Outcome: Progressing     

## 2014-10-30 NOTE — Plan of Care (Signed)
Problem: RH SAFETY Goal: RH STG DEMO UNDERSTANDING HOME SAFETY PRECAUTIONS Patient will be able to verbalize home safety precautions , no throw rugs , clutter free environment etc.  Outcome: Progressing

## 2014-10-30 NOTE — Progress Notes (Signed)
Speech Language Pathology Daily Session Note  Patient Details  Name: Cory [name] MRN: [id number] Date of Birth: 01/18/1961  Today's Date: 10/30/2014 SLP Individual Time: 0830-0930 SLP Individual Time Calculation (min): 60 min  Short Term Goals: Week 2: SLP Short Term Goal 1 (Week 2): Patient will recall and utilize recommended safe swallow strategies with Dys. 1 textures and nectar-thick liquids with Mod vebral cues to minimize overt s/s of aspiration.  SLP Short Term Goal 2 (Week 2): Patient will demonstrate effecient mastication of Dys. 2 textures with Mod verbal cues for 3 consecutive sessions to demonstrate readiness for texture advancement. SLP Short Term Goal 3 (Week 2): Patient will attend to left of environment with Mod question cues. SLP Short Term Goal 4 (Week 2): Patient will demonstrate sustained attention to a basic task for 10 minutes with Mod verbal cues for redirection. SLP Short Term Goal 5 (Week 2): Patient will label 2 physical and 2 cognitive deficits that have resulted from his CVA with Mod question cues.  SLP Short Term Goal 6 (Week 2): Patient will conume trials of thin liquids with no overt s/s of aspiration over 3 consecutive sessions to demonstrate readiness for a repeat objective swallow assessment.  Skilled Therapeutic Interventions: Skilled treatment session focused on dysphagia and cognitive goals. Upon arrival, patient was sitting upright in wheelchair and required Max multimodal cues to problem solve tray set-up.  Patient consumed trial tray of Dys. 2 textures and nectar-thick liquids via cup with Max multimodal cues with Total assist x1 to consume 1 bite/sip at a time with small portions, which resulted in cough x5.  Suctioning was utilized to assist with removing pocketed Dys.2 textures.  Patient consumed minimal amounts of Dys.2 textures reporting that they were too difficult to chew as a result, recommend to continue with current diet orders.  SLP also  facilitated session with Mod multimodal cues to utilize schedule to locate times of up coming therapy and calculate times between.  Continue with current plan of care.    FIM:  Comprehension Comprehension Mode: Auditory Comprehension: 3-Understands basic 50 - 74% of the time/requires cueing 25 - 50%  of the time Expression Expression Mode: Verbal Expression: 4-Expresses basic 75 - 89% of the time/requires cueing 10 - 24% of the time. Needs helper to occlude trach/needs to repeat words. Social Interaction Social Interaction: 3-Interacts appropriately 50 - 74% of the time - May be physically or verbally inappropriate. Problem Solving Problem Solving: 2-Solves basic 25 - 49% of the time - needs direction more than half the time to initiate, plan or complete simple activities Memory Memory: 2-Recognizes or recalls 25 - 49% of the time/requires cueing 51 - 75% of the time FIM - Eating Eating Activity: 4: Helper checks for pocketed food;5: Needs verbal cues/supervision;4: Help with managing cup/glass;2: Hand over hand assist  Pain Pain Assessment Pain Assessment: No/denies pain  Therapy/Group: Individual Therapy  Charlane FerrettiMelissa Kendarrius Tanzi, M.A., CCC-SLP [phone]  Markeisha Mancias 10/30/2014, 4:23 PM

## 2014-10-30 NOTE — Plan of Care (Signed)
Problem: RH COGNITION-NURSING Goal: RH STG USES MEMORY AIDS/STRATEGIES W/ASSIST TO PROBLEM SOLVE STG Uses Memory Aids/Strategies With min Assistance to Problem Solve.  Outcome: Progressing

## 2014-10-30 NOTE — Plan of Care (Signed)
Problem: RH BLADDER ELIMINATION Goal: RH STG MANAGE BLADDER WITH ASSISTANCE STG Manage Bladder With Mod I Assistance  Outcome: Progressing  Problem: RH SAFETY Goal: RH STG ADHERE TO SAFETY PRECAUTIONS W/ASSISTANCE/DEVICE STG Adhere to Safety Precautions With min Assistance/Device.  Outcome: Progressing Goal: RH STG DECREASED RISK OF FALL WITH ASSISTANCE STG Decreased Risk of Fall With min Assistance.  Outcome: Progressing     

## 2014-10-30 NOTE — Patient Care Conference (Signed)
Inpatient RehabilitationTeam Conference and Plan of Care Update Date: 10/30/2014   Time: 11;20 AM    Patient Name: Cory SleightStephen [name]      Medical Record Number: [id number]  Date of Birth: 1960-12-28 Sex: Male         Room/Bed: 4M08C/4M08C-01 Payor Info: Payor: /    Admitting Diagnosis: R MCA INFARCT  Admit Date/Time:  10/18/2014  4:07 PM Admission Comments: No comment available   Primary Diagnosis:  Right middle cerebral artery stroke Principal Problem: Right middle cerebral artery stroke  Patient Active Problem List   Diagnosis Date Noted  . Hemiparesis, left 10/23/2014  . Left-sided neglect 10/23/2014  . Alterations of sensations following CVA (cerebrovascular accident) 10/23/2014  . CVA (cerebral infarction) 10/18/2014  . Right middle cerebral artery stroke   . Dyslipidemia   . Acute CVA (cerebrovascular accident) 10/15/2014  . Stroke 10/15/2014  . Alcoholism 10/15/2014    Expected Discharge Date: Expected Discharge Date: 11/06/14  Team Members Present: Physician leading conference: Dr. Claudette LawsAndrew Kirsteins Social Worker Present: Dossie DerBecky Kasin Tonkinson, LCSW Nurse Present: Ronny BaconWhitney Reardon, RN PT Present: Harriet ButteEmily Parcell, PT;Melodi Happel Evelina BucyVarner, PT OT Present: Callie FieldingKatie Pittman, Heath LarkT;James McGuire, OT SLP Present: Fae PippinMelissa Bowie, SLP PPS Coordinator present : Tora DuckMarie Noel, RN, CRRN     Current Status/Progress Goal Weekly Team Focus  Medical   Left neglect, increasing tone  maintain medical stability  spasticity meds +/- botox   Bowel/Bladder   Continent to bowel and bladder.  To continue continent to bowel and bladder.  To monitor bowel and bladder function Q shift.   Swallow/Nutrition/ Hydration   Dys.1 textures and nectar-thick liquids wtih full supervision   least restrictive PO with Min assist  continue to increase sustained attention and mastication of Dys .2   ADL's   Min A for grooming and bathing; Mod A for UB dressing, LB dressing and shower transfer onto TTB, max verbal cues for  safety awarness and L inattention strategies  over all supervision - min A but will likely downgrade secondary to d/c to SNF      Mobility   S to min A for bed mobility, min A for squat pivot transfers, mod/max A for standing, max A (+2 w/ follow) for gait, pt continue to be severely limited by L ankle pain and decreased awareness, safety, and impulsivity.   S to min A overall (will likely downgrade if D/C to SNF)  R NMR, attention to the R, transfers, gait, standing balance, pt/family education   Communication   Supervision-Min assist   Supervision   increase vocal intensity   Safety/Cognition/ Behavioral Observations  Max assist  increased awareness of left side and maintain safety with Min cues  continue to increase intellectual awarenss, sustained attention and overal safety awareness   Pain   No complain of pain.  To keep pain level less than 3, on scale 1 to 10.  Monitor pain level Q 2 hrs.   Skin   Dressing dry and intact on Lt. chest.  To keep incision dry and clean.  To monitor dressing on Lt. chest,Q shift.      *See Care Plan and progress notes for long and short-term goals.  Barriers to Discharge: low level, limited caregiver support post D/C    Possible Resolutions to Barriers:  cont rehab, ?SNF    Discharge Planning/Teaching Needs:  Family trying to come up with a discharge plan-applying for Medicaid today daughter via telephone and disability this week      Team Discussion:  Ankle pain  limiting his progress along with his neglect and lack of awareness of his deficits. Spasity/tone limiting-MD to inject this week. Trying to develop a discharge plan with sister and daughter.  Revisions to Treatment Plan:  Probable NHP   Continued Need for Acute Rehabilitation Level of Care: The patient requires daily medical management by a physician with specialized training in physical medicine and rehabilitation for the following conditions: Daily direction of a multidisciplinary  physical rehabilitation program to ensure safe treatment while eliciting the highest outcome that is of practical value to the patient.: Yes Daily medical management of patient stability for increased activity during participation in an intensive rehabilitation regime.: Yes Daily analysis of laboratory values and/or radiology reports with any subsequent need for medication adjustment of medical intervention for : Neurological problems  Cory [name], Cory LivingsRebecca G 10/30/2014, 1:44 PM

## 2014-10-30 NOTE — Plan of Care (Signed)
Problem: RH SAFETY Goal: RH STG DECREASED RISK OF FALL WITH ASSISTANCE STG Decreased Risk of Fall With min Assistance.  Outcome: Progressing     

## 2014-10-30 NOTE — Progress Notes (Signed)
Occupational Therapy Session Note  Patient Details  Name: Cory SleightStephen Ledbetter MRN: [id number] Date of Birth: [birth year]  Today's Date: 10/30/2014 OT Individual Time: 1001-1101 OT Individual Time Calculation (min): 60 min    Short Term Goals: Week 2:  OT Short Term Goal 1 (Week 2): Pt will move/position arm in safe position during functional transfers and STS with min verbal guidance cues for increased safety awareness. OT Short Term Goal 2 (Week 2): Pt will donn B shoes and socks in order to increase I with LB dressing. OT Short Term Goal 3 (Week 2): Pt will locate 3 items to the L during B and D session with min verbal cues in order to increase attention to L during self care. OT Short Term Goal 4 (Week 2): Pt will perform toilet transfer with Min A in order to increase I in functional transfers  Skilled Therapeutic Interventions/Progress Updates:  Upon entering the room, pt seated in wheelchair awaiting therapist arrival with no c/o pain. Pt agreeable to shower this session. OT session with focus on self care- hemiplegic techniques, L inattention, safety awareness, functional transfers, STS and pt education. Pt continues to require Max verbal cues for safety awareness regarding L UE, locking/unlocking breaks, and decreased awareness of insight of deficits. Pt also perseverating on family issues during session and requiring increased verbal cues to attend to tasks this session. Pt requiring Mod A for shower transfer wheelchair <> TTB with use of grab bar. Pt engaged in dressing tasks seated in wheelchair at sink side. Pt seated in wheelchair with QRB and L arm tray donned with call bell within reach.  Therapy Documentation Precautions:  Precautions Precautions: Fall Precaution Comments: L inattention, brace on L ankle from recent fx Restrictions Weight Bearing Restrictions: No Pain: Pain Assessment Pain Score: 0-No pain  See FIM for current functional status  Therapy/Group: Individual  Therapy  [name], Makenize Messman L 10/30/2014, 11:42 AM

## 2014-10-30 NOTE — Plan of Care (Signed)
Problem: RH COGNITION-NURSING Goal: RH STG ANTICIPATES NEEDS/CALLS FOR ASSIST W/ASSIST/CUES STG Anticipates Needs/Calls for Assist With min Assistance/Cues.  Outcome: Progressing

## 2014-10-30 NOTE — Plan of Care (Signed)
Problem: RH BLADDER ELIMINATION Goal: RH STG MANAGE BLADDER WITH ASSISTANCE STG Manage Bladder With Mod I Assistance  Outcome: Progressing  Problem: RH SAFETY Goal: RH STG ADHERE TO SAFETY PRECAUTIONS W/ASSISTANCE/DEVICE STG Adhere to Safety Precautions With min Assistance/Device.  Outcome: Progressing Goal: RH STG DECREASED RISK OF FALL WITH ASSISTANCE STG Decreased Risk of Fall With min Assistance.  Outcome: Progressing Goal: RH STG DEMO UNDERSTANDING HOME SAFETY PRECAUTIONS Patient will be able to verbalize home safety precautions , no throw rugs , clutter free environment etc.  Outcome: Progressing  Problem: RH COGNITION-NURSING Goal: RH STG USES MEMORY AIDS/STRATEGIES W/ASSIST TO PROBLEM SOLVE STG Uses Memory Aids/Strategies With min Assistance to Problem Solve.  Outcome: Progressing Goal: RH STG ANTICIPATES NEEDS/CALLS FOR ASSIST W/ASSIST/CUES STG Anticipates Needs/Calls for Assist With min Assistance/Cues.  Outcome: Progressing  Problem: RH PAIN MANAGEMENT Goal: RH STG PAIN MANAGED AT OR BELOW PT'S PAIN GOAL Less than 2  Outcome: Progressing  Problem: RH KNOWLEDGE DEFICIT Goal: RH STG INCREASE KNOWLEDGE OF HYPERTENSION Patient will be able to verbalize hypertensive medications at discharge.  Outcome: Progressing Goal: RH STG INCREASE KNOWLEDGE OF DYSPHAGIA/FLUID INTAKE Patient and family will be able to verbalize understanding of swallowing precautions  Outcome: Progressing     

## 2014-10-30 NOTE — Progress Notes (Signed)
Recreational Therapy Session Note  Patient Details  Name: Cory [name] MRN: [id number] Date of Birth: 1961/07/04 Today's Date: 10/30/2014  Pain: no c/o Skilled Therapeutic Interventions/Progress Updates: Session focused on activity tolerance, standing balance/tolerance, attending to the left, sustained attention & problem solving.  Pt stood in standing frame to complete pipe tree with focus on standing at midline and scanning to the left.  Pt required mod-max verbal cues to locate pieces and total assist for problem solving how to piece them together to make the desire picture.  Therapy/Group: Co-Treatment   Lynette Topete 10/30/2014, 4:12 PM

## 2014-10-30 NOTE — Plan of Care (Signed)
Problem: RH BLADDER ELIMINATION Goal: RH STG MANAGE BLADDER WITH ASSISTANCE STG Manage Bladder With Mod I Assistance  Outcome: Progressing     

## 2014-10-31 ENCOUNTER — Ambulatory Visit (HOSPITAL_COMMUNITY): Payer: Self-pay | Admitting: Speech Pathology

## 2014-10-31 ENCOUNTER — Inpatient Hospital Stay (HOSPITAL_COMMUNITY): Payer: Self-pay | Admitting: Rehabilitation

## 2014-10-31 ENCOUNTER — Inpatient Hospital Stay (HOSPITAL_COMMUNITY): Payer: Medicaid Other | Admitting: Occupational Therapy

## 2014-10-31 ENCOUNTER — Encounter (HOSPITAL_COMMUNITY): Payer: Self-pay | Admitting: Internal Medicine

## 2014-10-31 NOTE — Procedures (Addendum)
Dysport (Botulinum toxin A) Injection for spasticity Lot #[id number], Exp 11/2014  Dilution: 250 Units/ml Indication: Severe spasticity which interferes with ADL,mobility and/or  hygiene and is unresponsive to medication management and other conservative care Informed consent was obtained after describing risks and benefits of the procedure with the patient. This includes bleeding, bruising, infection, excessive weakness, or medication side effects.  Needle: 25g 1.5 inch needle electrode Number of units per muscle  Hamstrings 125Units X4 medial hamstrings,  125 Units x 4 Lateral hamstrings=  1000 units total All injections were done after obtaining appropriate EMG activity and after negative drawback for blood. The patient tolerated the procedure well. Post procedure instructions were given. A followup appointment was made.

## 2014-10-31 NOTE — Progress Notes (Signed)
[place] PHYSICAL MEDICINE & REHABILITATION     PROGRESS NOTE    Subjective/Complaints: No pain c/o discussed LLE with PT yesterday, Hamstrings spasiticity interfering with mobility, cannot get Left foot flat   ROS limited due to mental status/affect  Objective: Vital Signs: Blood pressure 111/69, pulse 76, temperature 98.5 F (36.9 C), temperature source Oral, resp. rate 18, weight 77.4 kg (170 lb 10.2 oz), SpO2 100 %. No results found. No results for input(s): WBC, HGB, HCT, PLT in the last 72 hours.  Recent Labs  10/30/14 0305  NA 138  K 3.9  CL 104  GLUCOSE 117*  BUN 22  CREATININE 0.94  CALCIUM 9.5   CBG (last 3)  No results for input(s): GLUCAP in the last 72 hours.  Wt Readings from Last 3 Encounters:  10/23/14 77.4 kg (170 lb 10.2 oz)  10/15/14 79.289 kg (174 lb 12.8 oz)  09/03/14 86.183 kg (190 lb)    Physical Exam:  Constitutional: He is oriented to person, place, and time. He appears well-developed and well-nourished.  HENT:  Head: Normocephalic and atraumatic.  Eyes: Conjunctivae are normal. Pupils are equal, round, and reactive to light.   Neck: Normal range of motion. Neck supple.  Cardiovascular: Normal rate and regular rhythm.  Respiratory: Effort normal and breath sounds normal. No respiratory distress. He has no wheezes.  GI: Soft. Bowel sounds are normal.  Musculoskeletal: He exhibits mild pain with Left hip Int/ext rotation Neurological: He is alert and oriented to person, place, and time. Delayed processing.  Left facial weakness with mild dysarthria and left inattention.   Has poor insight and awareness of deficits and easily distracted . Motor strength 0/5 in left deltoid, biceps, triceps, 0/5 to?tr grip, hip flexor, trace knee extensor, 0 ankle dorsiflexion plantar flexion MAS 3 L hamstrings 5/5 on the right side in the same muscle groups.  Absent sensation in the left upper and left lower extremity.  Visual fields absent to  confrontation testing on left side  Skin: Skin is warm and dry.  Psychiatric: engages more   Assessment/Plan: 1. Functional deficits secondary to right MCA infarct, ?thrombotic, which require 3+ hours per day of interdisciplinary therapy in a comprehensive inpatient rehab setting. Physiatrist is providing close team supervision and 24 hour management of active medical problems listed below. Physiatrist and rehab team continue to assess barriers to discharge/monitor patient progress toward functional and medical goals.  FIM: FIM - Bathing Bathing Steps Patient Completed: Chest, Left Arm, Abdomen, Front perineal area, Right upper leg, Left upper leg, Right lower leg (including foot) Bathing: 4: Min-Patient completes 8-9 3021f 10 parts or 75+ percent  FIM - Upper Body Dressing/Undressing Upper body dressing/undressing steps patient completed: Thread/unthread right sleeve of pullover shirt/dresss, Put head through opening of pull over shirt/dress Upper body dressing/undressing: 3: Mod-Patient completed 50-74% of tasks FIM - Lower Body Dressing/Undressing Lower body dressing/undressing steps patient completed: Thread/unthread right pants leg, Thread/unthread left pants leg, Don/Doff right shoe, Don/Doff right sock, Thread/unthread right underwear leg Lower body dressing/undressing: 3: Mod-Patient completed 50-74% of tasks  FIM - Toileting Toileting: 1: Total-Patient completed zero steps, helper did all 3 (per Cipriano MileKambi Kelly, NT report)  FIM - Toilet Transfers Toilet Transfers: 4-To toilet/BSC: Min A (steadying Pt. > 75%), 4-From toilet/BSC: Min A (steadying Pt. > 75%)  FIM - Bed/Chair Transfer Bed/Chair Transfer Assistive Devices: Arm rests Bed/Chair Transfer: 5: Supine > Sit: Supervision (verbal cues/safety issues), 4: Bed > Chair or W/C: Min A (steadying Pt. > 75%)  FIM - Locomotion: Wheelchair Distance: 150 Locomotion: Wheelchair: 5: Travels 150 ft or more: maneuvers on rugs and over door  sills with supervision, cueing or coaxing FIM - Locomotion: Ambulation Locomotion: Ambulation Assistive Devices: Chief Operating OfficerWalker - Hemi, Orthosis Ambulation/Gait Assistance: 2: Max assist, 1: +2 Total assist Locomotion: Ambulation: 1: Two helpers  Comprehension Comprehension Mode: Auditory Comprehension: 3-Understands basic 50 - 74% of the time/requires cueing 25 - 50%  of the time  Expression Expression Mode: Verbal Expression: 4-Expresses basic 75 - 89% of the time/requires cueing 10 - 24% of the time. Needs helper to occlude trach/needs to repeat words.  Social Interaction Social Interaction: 3-Interacts appropriately 50 - 74% of the time - May be physically or verbally inappropriate.  Problem Solving Problem Solving: 2-Solves basic 25 - 49% of the time - needs direction more than half the time to initiate, plan or complete simple activities  Memory Memory: 2-Recognizes or recalls 25 - 49% of the time/requires cueing 51 - 75% of the time  Medical Problem List and Plan: 1. Functional deficits secondary to R-MCA infarct question embolic v/s thrombotic with resultant left hemiplegia with sensory deficits, left facial weakness, left neglect and dysphagia. 2. DVT Prophylaxis/Anticoagulation: Pharmaceutical: Lovenox 3. Pain Management: tylenol for h/a for now.  topamax for post stroke headache, improved will reduce dose 4. Mood: Seems withdrawn--will monitor for now. LCSW to follow for support and evaluation.  5. Neuropsych: This patient is capable of making decisions on his own behalf. 6. Skin/Wound Care: Routine pressure relief measures.  7. Fluids/Electrolytes/Nutrition: Monitor I/O. Follow up admission labs tomorrow 8. HTN: 9. H/o Alcohol abuse/Tobacco use: Will add nicotine patch as asking for his snuff. CIWA protocol. Continue folic acid and thiamine.  10. Dyslipidemia: 11.  Mild HyperK, ? Hemolysis , no obvious medication source, will recheck in am  12.  Hx subacute ankle fx (7-8  wks ago) recheck xrays, healing oblique fibular fx , non displaced, will need either ankle splint or AFO when OOB, f/u ortho (Dr Ophelia CharterYates) as outpt 13.  Spasticity increasing primarily L hamstring, start zanaflex, monitor BP,  botox given focal nature and severity, pt agrees to injection  LOS (Days) 13 A FACE TO FACE EVALUATION WAS PERFORMED  Erick ColaceKIRSTEINS,ANDREW E 10/31/2014 6:46 AM

## 2014-10-31 NOTE — Progress Notes (Signed)
Occupational Therapy Weekly Progress Note  Patient Details  Name: Ignazio Kincaid MRN: [id number] Date of Birth: Jan 06, 1961  Beginning of progress report period: October 26, 2014 End of progress report period: November 01, 2014  Today's Date: 11/01/2014 OT Individual Time:  3953- 0859   60 minutes   Patient has met 2 of 4 short term goals.  Pt making slow steady progress towards therapy goals. Pt able to identify 3 items to L during bathing and dressing with min verbal guidance cues. Pt also showing insight at times with deficits during therapy sessions.   Patient continues to demonstrate the following deficits: decreased coordination, decreased strength and ROM in L UE and L LE, self care, functional mobility, functional transfers, safety awareness, standing balance and therefore will continue to benefit from skilled OT intervention to enhance overall performance with BADL.  Patient Pt making slow progress towards goals with discharge planning to SNF now. LTG's changed to reflect this change and pt progress..  Plan of care revisions - some goals downgraded based on progress.  OT Short Term Goals Week 2:  OT Short Term Goal 1 (Week 2): Pt will move/position arm in safe position during functional transfers and STS with min verbal guidance cues for increased safety awareness. OT Short Term Goal 1 - Progress (Week 2): Met OT Short Term Goal 2 (Week 2): Pt will donn B shoes and socks in order to increase I with LB dressing. OT Short Term Goal 2 - Progress (Week 2): Progressing toward goal OT Short Term Goal 3 (Week 2): Pt will locate 3 items to the L during B and D session with min verbal cues in order to increase attention to L during self care. OT Short Term Goal 3 - Progress (Week 2): Met OT Short Term Goal 4 (Week 2): Pt will perform toilet transfer with Min A in order to increase I in functional transfers OT Short Term Goal 4 - Progress (Week 2): Progressing toward goal Week 3:  OT  Short Term Goal 1 (Week 3): progress towards long term goals based on estimated discharge date  Skilled Therapeutic Interventions/Progress Updates:  Upon entering the room, pt supine in bed talking on phone. Pt with no c/o pain during this session. Pt performed supine >sit with supervision and handrail. Transfer bed> wheelchair with Min A. Pt agreeable to shower this session with therapist obtaining clothing items. Pt transferred wheelchair <> TTB with Min A and use of grab bar. Bathing in shower with Mod A this session. Pt returning to wheelchair to perform dressing tasks at sink. Max verbal cues for set up of wheelchair and safety awareness during STS. STS x 3 reps with Min A this session and once standing for dressing OT assisted pt in weight bearing through L UE for stabilizing self. Pt seated in wheelchair with arm tray and QRB donned with call bell within reach.  Therapy Documentation Precautions:  Precautions Precautions: Fall Precaution Comments: L inattention, brace on L ankle from recent fx Restrictions Weight Bearing Restrictions: No  See FIM for current functional status  Therapy/Group: Individual Therapy  Phineas Semen 10/31/2014, 8:30 PM

## 2014-10-31 NOTE — Progress Notes (Signed)
Social Work Patient ID: Cory [name], male   DOB: [birth year], 53 y.o.   MRN: [id number] Spoke with daughter-Tyisha via telephone to discuss team conference progression and if Medicaid application taken yesterday and Disability application taken today. She reports disability calling her today to do application over the phone-since she is in [place].  Met with pt's sister and brother yesterday that were here form [place].  All plan for pt to go to a NH from here then eventually to daughter's home in [place] or another family member's home, once [place] and Disability approved. All are aware of the process of looking for NH bed with a LOG from the hospital.  Will begin the search.

## 2014-10-31 NOTE — Progress Notes (Signed)
Occupational Therapy Session Note  Patient Details  Name: Cory [name] MRN: [id number] Date of Birth: Jan 01, 1961  Today's Date: 10/31/2014 OT Individual Time: 1000-1100 OT Individual Time Calculation (min): 60 min    Short Term Goals: Week 2:  OT Short Term Goal 1 (Week 2): Pt will move/position arm in safe position during functional transfers and STS with min verbal guidance cues for increased safety awareness. OT Short Term Goal 2 (Week 2): Pt will donn B shoes and socks in order to increase I with LB dressing. OT Short Term Goal 3 (Week 2): Pt will locate 3 items to the L during B and D session with min verbal cues in order to increase attention to L during self care. OT Short Term Goal 4 (Week 2): Pt will perform toilet transfer with Min A in order to increase I in functional transfers  Skilled Therapeutic Interventions/Progress Updates:  Upon entering the room, pt seated in wheelchair with no c/o pain. Pt transitioning easily from SLP session. Skilled OT session with focus self care, STS, L inattention, and dynamic standing balance. Pt transfer wheelchair<> TTB with use of grab bar and min A. Pt declined toileting during session. Pt seated in wheelchair at sink side for dressing and grooming tasks. Pt able to identify 3/3 items for grooming tasks on the L side with min verbal cues for strategies. STS with Min A and need to block L knee when standing. Pt utilizing mirror to self correct posture with min verbal cues to do so. OT provided [facility] assist to incorporate R UE in tasks such as bathing, applying deodorant, and weight bearing  while standing. Pt seated in wheelchair with QRB donned and call bell within reach upon exiting the room.   Therapy Documentation Precautions:  Precautions Precautions: Fall Precaution Comments: L inattention, brace on L ankle from recent fx Restrictions Weight Bearing Restrictions: No  See FIM for current functional status  Therapy/Group: Individual  Therapy  [name], Sereena Marando L 10/31/2014, 3:59 PM

## 2014-10-31 NOTE — Plan of Care (Signed)
Problem: RH BLADDER ELIMINATION Goal: RH STG MANAGE BLADDER WITH ASSISTANCE STG Manage Bladder With Mod I Assistance  Outcome: Progressing  Problem: RH SAFETY Goal: RH STG ADHERE TO SAFETY PRECAUTIONS W/ASSISTANCE/DEVICE STG Adhere to Safety Precautions With min Assistance/Device.  Outcome: Progressing Goal: RH STG DECREASED RISK OF FALL WITH ASSISTANCE STG Decreased Risk of Fall With min Assistance.  Outcome: Progressing  Problem: RH COGNITION-NURSING Goal: RH STG USES MEMORY AIDS/STRATEGIES W/ASSIST TO PROBLEM SOLVE STG Uses Memory Aids/Strategies With min Assistance to Problem Solve.  Outcome: Progressing Goal: RH STG ANTICIPATES NEEDS/CALLS FOR ASSIST W/ASSIST/CUES STG Anticipates Needs/Calls for Assist With min Assistance/Cues.  Outcome: Progressing  Problem: RH PAIN MANAGEMENT Goal: RH STG PAIN MANAGED AT OR BELOW PT'S PAIN GOAL Less than 2  Outcome: Progressing

## 2014-10-31 NOTE — Progress Notes (Signed)
Speech Language Pathology Daily Session Note  Patient Details  Name: Cory SleightStephen Mctigue MRN: [id number] Date of Birth: [birth year]  Today's Date: 10/31/2014 SLP Individual Time: 0900-1000 SLP Individual Time Calculation (min): 60 min  Short Term Goals: Week 2: SLP Short Term Goal 1 (Week 2): Patient will recall and utilize recommended safe swallow strategies with Dys. 1 textures and nectar-thick liquids with Mod vebral cues to minimize overt s/s of aspiration.  SLP Short Term Goal 2 (Week 2): Patient will demonstrate effecient mastication of Dys. 2 textures with Mod verbal cues for 3 consecutive sessions to demonstrate readiness for texture advancement. SLP Short Term Goal 3 (Week 2): Patient will attend to left of environment with Mod question cues. SLP Short Term Goal 4 (Week 2): Patient will demonstrate sustained attention to a basic task for 10 minutes with Mod verbal cues for redirection. SLP Short Term Goal 5 (Week 2): Patient will label 2 physical and 2 cognitive deficits that have resulted from his CVA with Mod question cues.  SLP Short Term Goal 6 (Week 2): Patient will conume trials of thin liquids with no overt s/s of aspiration over 3 consecutive sessions to demonstrate readiness for a repeat objective swallow assessment.  Skilled Therapeutic Interventions:  Pt was seen for skilled ST targeting goals for dysphagia and cognition.  Upon arrival, pt was partially reclined in bed reading a pamphlet.   Pt required mod verbal cues for redirection to structured therapeutic tasks as pt was perseverative on reading the abovementioned pamphlet as part of his "morning routine."  Furthermore, pt required overall min-mod assist for initiation and sequencing as well ask asking for assistance appropriately during basic self care tasks prior to transferring to wheelchair.  Once seated upright in wheelchair, pt required overall mod assist verbal cues for rate and portion control when consuming  presentations of his currently prescribed diet.  No overt s/s of aspiration were noted with dys 1 textures or nectar thick liquids via cup sips although pt continues to present with left pocketing and anterior loss of boluses due to weakness.  Upon completion of meal, SLP facilitated the session with a structured reading task targeting visual scanning and sustained attention.  Pt required overall min-mod assist for left attention when reading and benefited from use of finger track to focus and direct his visual scanning.  Pt making progress towards goals.  Continue per current plan of care.    FIM:  Comprehension Comprehension Mode: Auditory Comprehension: 3-Understands basic 50 - 74% of the time/requires cueing 25 - 50%  of the time Expression Expression Mode: Verbal Expression: 4-Expresses basic 75 - 89% of the time/requires cueing 10 - 24% of the time. Needs helper to occlude trach/needs to repeat words. Social Interaction Social Interaction: 3-Interacts appropriately 50 - 74% of the time - May be physically or verbally inappropriate. Problem Solving Problem Solving: 2-Solves basic 25 - 49% of the time - needs direction more than half the time to initiate, plan or complete simple activities Memory Memory: 2-Recognizes or recalls 25 - 49% of the time/requires cueing 51 - 75% of the time FIM - Eating Eating Activity: 4: Helper checks for pocketed food;5: Needs verbal cues/supervision;4: Help with managing cup/glass  Pain Pain Assessment Pain Assessment: No/denies pain Pain Score: 0-No pain  Therapy/Group: Individual Therapy  Nikhil Osei, Melanee SpryNicole L 10/31/2014, 9:59 AM

## 2014-10-31 NOTE — Progress Notes (Signed)
Occupational Therapy Session Note  Patient Details  Name: Cory [name] MRN: [id number] Date of Birth: 01-Oct-1961  Today's Date: 10/31/2014 OT Individual Time: 1100 -1200 OT Individual Time Calculation (min): 60 min   Short Term Goals: Week 2:  OT Short Term Goal 1 (Week 2): Pt will move/position arm in safe position during functional transfers and STS with min verbal guidance cues for increased safety awareness. OT Short Term Goal 2 (Week 2): Pt will donn B shoes and socks in order to increase I with LB dressing. OT Short Term Goal 3 (Week 2): Pt will locate 3 items to the L during B and D session with min verbal cues in order to increase attention to L during self care. OT Short Term Goal 4 (Week 2): Pt will perform toilet transfer with Min A in order to increase I in functional transfers  Skilled Therapeutic Interventions/Progress Updates:  (Co Treat 30 of the 60 min with Rec Therapy).  Patient resting in w/c just having completed self care session with his primary OT.  Engaged in w/c propulsion, attention to left side of his body, left environment and scanning to left, management of LUE during all transitional movements and at rest, sit ><stands, postural control, midline orientation, decreased overuse of right side, tall kneeling, squat pivot transfers with vcs and only steady assist when traveling to his right, bed mobility, bed positioning and LUE PROM exercises in supine.  Patient able to propel w/c with min cues to maintain straight line and to speed up and max cues when turning corner to left to enter a room and bumping into the doorframe 10-12 times before finally able to enter the room.  Sit><stand with focus on anterior weight shift and zero use of RUE to decrease overuse and decrease pushing attempts.  Weight bearing into LLE while standing and facing therapy mat to slowly place right knee on and off mat and significant activity in LLE during this task.  Tall kneeling and reaching  activity with RT with patient reporting need to rest.  Reaching and weight shifting activity with TR in standing to improve left sided muscle activation.  Practiced bed mobility to include optimal bed positioning while sidelying on left.  Patient performed squat pivot transfer to w/c and QRB in place.  Therapy Documentation Precautions:  Precautions Precautions: Fall Precaution Comments: L inattention, brace on L ankle from recent fx Restrictions Weight Bearing Restrictions: No Pain: No report of pain except left shoulder during weight bearing.  Repositioned and rest.  Therapy/Group: Individual Therapy and Co-Treatment with Recreational Therapy for 30 of the 60 min session  Samreet Edenfield 10/31/2014, 10:55 AM

## 2014-10-31 NOTE — Progress Notes (Signed)
Recreational Therapy Session Note  Patient Details  Name: Cory [name] MRN: [id number] Date of Birth: 03/06/1961 Today's Date: 10/31/2014  Pain: c/o of left shoulder & side pain intermittently throughout session, relieved with repositioning Skilled Therapeutic Interventions/Progress Updates: Session focused on activity tolerance, w/c mobility, dynamic standing balance, tall kneeling, midline orientation, WBing through LLE.  Pt propelled w/c on unit with supervision & max cues for safety/attending to the left.  Pt in tall kneeling and standing reaching outside BOS for card activity during co-treat with OT.  Therapy/Group: Co-Treatment Lekha Dancer 10/31/2014, 12:39 PM

## 2014-11-01 ENCOUNTER — Inpatient Hospital Stay (HOSPITAL_COMMUNITY): Payer: Medicaid Other | Admitting: Occupational Therapy

## 2014-11-01 ENCOUNTER — Inpatient Hospital Stay (HOSPITAL_COMMUNITY): Payer: Self-pay | Admitting: Speech Pathology

## 2014-11-01 ENCOUNTER — Inpatient Hospital Stay (HOSPITAL_COMMUNITY): Payer: Self-pay | Admitting: Rehabilitation

## 2014-11-01 NOTE — Progress Notes (Signed)
Physical Therapy Session Note  Patient Details  Name: Cory SleightStephen [name] MRN: [id number] Date of Birth: 31-May-1961  Today's Date: 11/01/2014 PT Individual Time: 1400-1500 PT Individual Time Calculation (min): 60 min   Short Term Goals: Week 2:  PT Short Term Goal 1 (Week 2): Pt will perform bed mobility with HOB flat and without rails at min A level with min cues for attending to L side of body.  PT Short Term Goal 2 (Week 2): Pt will perform functional transfers with min A and min cues for safety and attention to L side of body.  PT Short Term Goal 3 (Week 2): Pt will self propel w/c 150' using R hemi technique at S level.  PT Short Term Goal 4 (Week 2): Pt will ambulate x 25' w/ LRAD at mod A level   Skilled Therapeutic Interventions/Progress Updates:   Pt received lying in bed, agreeable to therapy session.  Performed bed mobility initially without performing rolling and unable to perform.  Therefore continue to provide assist for knee flex and rolling to R side, lowering LEs off of bed and elevating trunk which made mobility easier.  Transferred to w/c with light min A and cues for safety and ensuring safe placement of LLE.  Pt self propelled to/from therapy gym x 150' x 2 using R hemi technique at S level.  Pt much better at avoiding obstacles and steering in straight path.  Once in therapy gym, session focused on midline posture, increased LLE WB and weight shift with mini squat posture, seated reaching activity for trunk rotation and WB through LUE.  Ended session with gait in hallway with use of hemi walker.  During standing and seated activities, placed mirror in front of pt for increased visual feedback for posture.  During squatting, pt noted to have improved midline posture with cues for continued R knee flex and increased L rotation. Assisted with weight shifts L and midline in order to retrieve and place cones at mod/max A.  Then transitioned to stand to squat, then reaching in squatted  position to the L and back to midline then back to standing.  Continued cues for correct head alignment and pelvic alignment.  During seated rest breaks, assisted with anterior pelvic tilt at hips and ribs with cues for midline head posture.  Progressed to seated reaching activity with therapist assisting at trunk for rotation and assist at L UE for elbow extension for WB while reaching.  Ended with gait x 25' x 2 reps with hemi walker and mirror for visual feedback.  Requires max A with +2 for chair follow).  Assisted with placement of LLE (tone still present but improved from earlier in the week), stability at L knee during L stance and at hips for anterior pelvic tilt and upright posture throughout.  Tolerated well and pt able to verbalized what his deficits are with walking.  Assisted pt back to room and left with quick release belt donned and all needs in reach.   Therapy Documentation Precautions:  Precautions Precautions: Fall Precaution Comments: L inattention, brace on L ankle from recent fx Restrictions Weight Bearing Restrictions: No   Pain: Pain Assessment Pain Assessment: No/denies pain   Locomotion : Ambulation Ambulation/Gait Assistance: 2: Max assist;1: +2 Total assist Wheelchair Mobility Distance: 150   See FIM for current functional status  Therapy/Group: Individual Therapy  [name], Epsie Walthall Ann 11/01/2014, 4:16 PM

## 2014-11-01 NOTE — Progress Notes (Signed)
[facility]IL[facility]     PROGRESS NOTE    Subjective/Complaints: Pt recalls botulinum injection yesterday   ROS limited due to mental status/affect  Objective: Vital Signs: Blood pressure 134/72, pulse 90, temperature 99.1 F (37.3 C), temperature source Oral, resp. rate 18, weight 77.4 kg (170 lb 10.2 oz), SpO2 100 %. No results found. No results for input(s): WBC, HGB, HCT, PLT in the last 72 hours.  Recent Labs  10/30/14 0305  NA 138  K 3.9  CL 104  GLUCOSE 117*  BUN 22  CREATININE 0.94  CALCIUM 9.5   CBG (last 3)  No results for input(s): GLUCAP in the last 72 hours.  Wt Readings from Last 3 Encounters:  10/23/14 77.4 kg (170 lb 10.2 oz)  10/15/14 79.289 kg (174 lb 12.8 oz)  09/03/14 86.183 kg (190 lb)    Physical Exam:  Constitutional: He is oriented to person, place, and time. He appears well-developed and well-nourished.  HENT:  Head: Normocephalic and atraumatic.  Eyes: Conjunctivae are normal. Pupils are equal, round, and reactive to light.   Neck: Normal range of motion. Neck supple.  Cardiovascular: Normal rate and regular rhythm.  Respiratory: Effort normal and breath sounds normal. No respiratory distress. He has no wheezes.  GI: Soft. Bowel sounds are normal.  Musculoskeletal: He exhibits mild pain with Left hip Int/ext rotation Neurological: He is alert and oriented to person, place, and time. Delayed processing.  Left facial weakness with mild dysarthria and left inattention.   Has poor insight and awareness of deficits and easily distracted . Motor strength 0/5 in left deltoid, biceps, triceps, 0/5 to?tr grip, hip flexor, trace knee extensor, 0 ankle dorsiflexion plantar flexion MAS 3 L hamstrings, MAS 2 in hip adductors 5/5 on the right side in the same muscle groups.  Absent sensation in the left upper and left lower extremity.  Visual fields absent to confrontation testing on left side  Skin: Skin is warm and dry.   Psychiatric: engages more   Assessment/Plan: 1. Functional deficits secondary to right MCA infarct, ?thrombotic, which require 3+ hours per day of interdisciplinary therapy in a comprehensive inpatient rehab setting. Physiatrist is providing close team supervision and 24 hour management of active medical problems listed below. Physiatrist and rehab team continue to assess barriers to discharge/monitor patient progress toward functional and medical goals.  FIM: FIM - Bathing Bathing Steps Patient Completed: Chest, Left Arm, Abdomen, Front perineal area, Right upper leg, Left upper leg, Right lower leg (including foot) Bathing: 4: Min-Patient completes 8-9 652f 10 parts or 75+ percent  FIM - Upper Body Dressing/Undressing Upper body dressing/undressing steps patient completed: Thread/unthread right sleeve of pullover shirt/dresss, Put head through opening of pull over shirt/dress Upper body dressing/undressing: 3: Mod-Patient completed 50-74% of tasks FIM - Lower Body Dressing/Undressing Lower body dressing/undressing steps patient completed: Thread/unthread right pants leg, Thread/unthread left pants leg, Don/Doff right shoe, Don/Doff right sock, Thread/unthread right underwear leg Lower body dressing/undressing: 3: Mod-Patient completed 50-74% of tasks  FIM - Toileting Toileting: 1: Total-Patient completed zero steps, helper did all 3 (per Cipriano MileKambi Kelly, NT report)  FIM - Toilet Transfers Toilet Transfers: 4-To toilet/BSC: Min A (steadying Pt. > 75%), 4-From toilet/BSC: Min A (steadying Pt. > 75%)  FIM - Bed/Chair Transfer Bed/Chair Transfer Assistive Devices: Arm rests Bed/Chair Transfer: 5: Supine > Sit: Supervision (verbal cues/safety issues), 4: Bed > Chair or W/C: Min A (steadying Pt. > 75%)  FIM - Locomotion: Wheelchair Distance: 150 Locomotion: Wheelchair:  5: Travels 150 ft or more: maneuvers on rugs and over door sills with supervision, cueing or coaxing FIM - Locomotion:  Ambulation Locomotion: Ambulation Assistive Devices: Chief Operating OfficerWalker - Hemi, Orthosis Ambulation/Gait Assistance: 2: Max assist, 1: +2 Total assist Locomotion: Ambulation: 1: Two helpers  Comprehension Comprehension Mode: Auditory Comprehension: 3-Understands basic 50 - 74% of the time/requires cueing 25 - 50%  of the time  Expression Expression Mode: Verbal Expression: 4-Expresses basic 75 - 89% of the time/requires cueing 10 - 24% of the time. Needs helper to occlude trach/needs to repeat words.  Social Interaction Social Interaction: 3-Interacts appropriately 50 - 74% of the time - May be physically or verbally inappropriate.  Problem Solving Problem Solving: 2-Solves basic 25 - 49% of the time - needs direction more than half the time to initiate, plan or complete simple activities  Memory Memory: 2-Recognizes or recalls 25 - 49% of the time/requires cueing 51 - 75% of the time  Medical Problem List and Plan: 1. Functional deficits secondary to R-MCA infarct question embolic v/s thrombotic with resultant left hemiplegia with sensory deficits, left facial weakness, left neglect and dysphagia. 2. DVT Prophylaxis/Anticoagulation: Pharmaceutical: Lovenox 3. Pain Management: tylenol for h/a for now.  topamax for post stroke headache, improved will reduce dose 4. Mood: Seems withdrawn--will monitor for now. LCSW to follow for support and evaluation.  5. Neuropsych: This patient is capable of making decisions on his own behalf. 6. Skin/Wound Care: Routine pressure relief measures.  7. Fluids/Electrolytes/Nutrition: Monitor I/O. Follow up admission labs tomorrow 8. HTN: 9. H/o Alcohol abuse/Tobacco use: Will add nicotine patch as asking for his snuff. CIWA protocol. Continue folic acid and thiamine.  10. Dyslipidemia: 11.  Mild HyperK, ? Hemolysis , no obvious medication source, will recheck in am  12.  Hx subacute ankle fx (7-8 wks ago) recheck xrays, healing oblique fibular fx , non  displaced, will need either ankle splint or AFO when OOB, f/u ortho (Dr Ophelia CharterYates) as outpt 13.  Spasticity increasing primarily L hamstring, start zanaflex, monitor BP,  Botulinum should take effect next wk   LOS (Days) 14 A FACE TO FACE EVALUATION WAS PERFORMED  KIRSTEINS,ANDREW E 11/01/2014 7:10 AM

## 2014-11-01 NOTE — Progress Notes (Signed)
Speech Language Pathology Weekly Progress and Session Note  Patient Details  Name: Cory [name] MRN: [id number] Date of Birth: Nov 12, 1961  Beginning of progress report period: October 25, 2014 End of progress report period: November 01, 2014  Today's Date: 11/01/2014 SLP Individual Time: 1003-1103 SLP Individual Time Calculation (min): 60 min  Short Term Goals: Week 2: SLP Short Term Goal 1 (Week 2): Patient will recall and utilize recommended safe swallow strategies with Dys. 1 textures and nectar-thick liquids with Mod vebral cues to minimize overt s/s of aspiration.  SLP Short Term Goal 1 - Progress (Week 2): Met SLP Short Term Goal 2 (Week 2): Patient will demonstrate effecient mastication of Dys. 2 textures with Mod verbal cues for 3 consecutive sessions to demonstrate readiness for texture advancement. SLP Short Term Goal 2 - Progress (Week 2): Progressing toward goal SLP Short Term Goal 3 (Week 2): Patient will attend to left of environment with Mod question cues. SLP Short Term Goal 3 - Progress (Week 2): Progressing toward goal SLP Short Term Goal 4 (Week 2): Patient will demonstrate sustained attention to a basic task for 10 minutes with Mod verbal cues for redirection. SLP Short Term Goal 4 - Progress (Week 2): Met SLP Short Term Goal 5 (Week 2): Patient will label 2 physical and 2 cognitive deficits that have resulted from his CVA with Mod question cues.  SLP Short Term Goal 5 - Progress (Week 2): Progressing toward goal SLP Short Term Goal 6 (Week 2): Patient will conume trials of thin liquids with no overt s/s of aspiration over 3 consecutive sessions to demonstrate readiness for a repeat objective swallow assessment. SLP Short Term Goal 6 - Progress (Week 2): Progressing toward goal    New Short Term Goals: Week 3: SLP Short Term Goal 1 (Week 3): Patient will recall and utilize recommended safe swallow strategies with Dys. 1 textures and nectar-thick liquids with Min  vebral cues to minimize overt s/s of aspiration.  SLP Short Term Goal 2 (Week 3): Patient will demonstrate effecient mastication of Dys. 2 textures with Mod verbal cues for 3 consecutive sessions to demonstrate readiness for texture advancement. SLP Short Term Goal 3 (Week 3): Patient will attend to left of environment with Mod question cues. SLP Short Term Goal 4 (Week 3): Patient will demonstrate sustained attention to a basic task for 10 minutes with Min verbal cues for redirection. SLP Short Term Goal 5 (Week 3): Patient will label 2 physical and 2 cognitive deficits that have resulted from his CVA with Mod question cues.  SLP Short Term Goal 6 (Week 3): Patient will conume trials of thin liquids with no overt s/s of aspiration over 3 consecutive sessions to demonstrate readiness for a repeat objective swallow assessment.  Weekly Progress Updates:  Pt made slow functional gains this reporting period and has met 2 out of 6 short term goals.  Currently, pt requires overall mod-max assist for cognitive-linguistic tasks due to poor intellectual awareness of deficits, decreased sustained attention, left inattention, and decreased planning, thought organization, and self monitoring for functional problem solving.  Pt requires mod assist for use of swallowing precautions to minimize overt s/s of aspiration with dys 1 textures and nectar thick liquids.  Pt would continue to benefit from skilled ST to maximize functional independence and reduce burden of care prior to discharge.  Continue to recommend 24/7 supervision and ST follow up in either the home health or outpatient setting.  No family has been present for training during  this reporting period.     Intensity: Minumum of 1-2 x/day, 30 to 90 minutes Frequency: 5 out of 7 days Duration/Length of Stay: 21-24 Treatment/Interventions: Cognitive remediation/compensation;Cueing hierarchy;Dysphagia/aspiration precaution training;Functional  tasks;Environmental controls;Internal/external aids;Medication managment;Patient/family education;Speech/Language facilitation;Therapeutic Activities   Daily Session  Skilled Therapeutic Interventions: Pt was seen for skilled ST targeting cognitive goals.  Upon arrival, pt was seated upright in wheelchair, awake, alert, and playing loud music.  When SLP asked pt to turn off music during therapy session, pt required max assist to utilize his smart phone to locate and utilize the appropriate applications to do so due to decreased awareness and self-monitoring of perseveration.   SLP facilitated the session with mod verbal cues for safety and awareness of obstacles on the pt's left side when propelling his wheelchair from his room to the therapy room.  Once in the therapy room, SLP facilitated the session with a basic new learning activity targeting visual scanning to the left, which pt completed for 100% accuracy with min assist-supervision cues.  When SLP increased task complexity by incorporating sorting and organizing with visual scanning, pt required overall mod-max assist to complete the task and exhibited poor awareness of errors.  Goals updated on this date to reflect current progress and plan of care.         FIM:  Comprehension Comprehension Mode: Auditory Comprehension: 4-Understands basic 75 - 89% of the time/requires cueing 10 - 24% of the time Expression Expression Mode: Verbal Expression: 4-Expresses basic 75 - 89% of the time/requires cueing 10 - 24% of the time. Needs helper to occlude trach/needs to repeat words. Social Interaction Social Interaction: 3-Interacts appropriately 50 - 74% of the time - May be physically or verbally inappropriate. Problem Solving Problem Solving: 3-Solves basic 50 - 74% of the time/requires cueing 25 - 49% of the time Memory Memory: 2-Recognizes or recalls 25 - 49% of the time/requires cueing 51 - 75% of the time  Pain Pain Assessment Pain  Assessment: No/denies pain  Therapy/Group: Individual Therapy   Windell Moulding, M.A. CCC-SLP  Special Ranes, Selinda Orion 11/01/2014, 4:13 PM

## 2014-11-01 NOTE — Plan of Care (Signed)
Problem: RH Balance Goal: LTG Patient will maintain dynamic standing with ADLs (OT) LTG: Patient will maintain dynamic standing balance with assist during activities of daily living (OT)  Downgraded secondary to slow progress towards goals and change in discharge to SNF   Problem: RH Dressing Goal: LTG Patient will perform upper body dressing (OT) LTG Patient will perform upper body dressing with assist, with/without cues (OT).  Downgraded secondary to slow progress towards goals and change in discharge to SNF  Goal: LTG Patient will perform lower body dressing w/assist (OT) LTG: Patient will perform lower body dressing with assist, with/without cues in positioning using equipment (OT)  Downgraded secondary to slow progress towards goals and change in discharge to SNF   Problem: RH Toileting Goal: LTG Patient will perform toileting w/assist, cues/equip (OT) LTG: Patient will perform toiletiing (clothes management/hygiene) with assist, with/without cues using equipment (OT)  Downgraded secondary to slow progress towards goals and change in discharge to SNF   Problem: RH Toilet Transfers Goal: LTG Patient will perform toilet transfers w/assist (OT) LTG: Patient will perform toilet transfers with assist, with/without cues using equipment (OT)  Downgraded secondary to slow progress towards goals and change in discharge to SNF   Problem: RH Tub/Shower Transfers Goal: LTG Patient will perform tub/shower transfers w/assist (OT) LTG: Patient will perform tub/shower transfers with assist, with/without cues using equipment (OT)  Downgraded secondary to slow progress towards goals and change in discharge to SNF

## 2014-11-02 ENCOUNTER — Inpatient Hospital Stay (HOSPITAL_COMMUNITY): Payer: Self-pay | Admitting: Physical Therapy

## 2014-11-02 ENCOUNTER — Inpatient Hospital Stay (HOSPITAL_COMMUNITY): Payer: Medicaid Other | Admitting: Occupational Therapy

## 2014-11-02 NOTE — Progress Notes (Signed)
[facility] PHYSICAL MEDICINE & REHABILITATION     PROGRESS NOTE    Subjective/Complaints: No issues overnite, denied spasms   ROS limited due to mental status/affect  Objective: Vital Signs: Blood pressure 109/70, pulse 87, temperature 98.4 F (36.9 C), temperature source Oral, resp. rate 16, weight 77.4 kg (170 lb 10.2 oz), SpO2 100 %. No results found. No results for input(s): WBC, HGB, HCT, PLT in the last 72 hours. No results for input(s): NA, K, CL, GLUCOSE, BUN, CREATININE, CALCIUM in the last 72 hours.  Invalid input(s): CO CBG (last 3)  No results for input(s): GLUCAP in the last 72 hours.  Wt Readings from Last 3 Encounters:  10/23/14 77.4 kg (170 lb 10.2 oz)  10/15/14 79.289 kg (174 lb 12.8 oz)  09/03/14 86.183 kg (190 lb)    Physical Exam:  Constitutional: He is oriented to person, place, and time. He appears well-developed and well-nourished.  HENT:  Head: Normocephalic and atraumatic.  Eyes: Conjunctivae are normal. Pupils are equal, round, and reactive to light.   Neck: Normal range of motion. Neck supple.  Cardiovascular: Normal rate and regular rhythm.  Respiratory: Effort normal and breath sounds normal. No respiratory distress. He has no wheezes.  GI: Soft. Bowel sounds are normal.  Musculoskeletal: He exhibits mild pain with Left hip Int/ext rotation Neurological: He is alert and oriented to person, place, and time. Delayed processing.  Left facial weakness with mild dysarthria and left inattention.   Has poor insight and awareness of deficits and easily distracted . Motor strength 0/5 in left deltoid, biceps, triceps, 0/5 to?tr grip, hip flexor, trace knee extensor, 0 ankle dorsiflexion plantar flexion  5/5 on the right side in the same muscle groups.  Absent sensation in the left upper and left lower extremity.  Visual fields absent to confrontation testing on left side  Skin: Skin is warm and dry.  Psychiatric: engages more   Assessment/Plan: 1.  Functional deficits secondary to right MCA infarct, ?thrombotic, which require 3+ hours per day of interdisciplinary therapy in a comprehensive inpatient rehab setting. Physiatrist is providing close team supervision and 24 hour management of active medical problems listed below. Physiatrist and rehab team continue to assess barriers to discharge/monitor patient progress toward functional and medical goals.  FIM: FIM - Bathing Bathing Steps Patient Completed: Chest, Left Arm, Abdomen, Front perineal area, Right upper leg, Left upper leg Bathing: 3: Mod-Patient completes 5-7 6344f 10 parts or 50-74%  FIM - Upper Body Dressing/Undressing Upper body dressing/undressing steps patient completed: Thread/unthread right sleeve of pullover shirt/dresss, Put head through opening of pull over shirt/dress Upper body dressing/undressing: 3: Mod-Patient completed 50-74% of tasks FIM - Lower Body Dressing/Undressing Lower body dressing/undressing steps patient completed: Thread/unthread right pants leg, Thread/unthread left pants leg, Don/Doff right shoe, Don/Doff right sock, Thread/unthread right underwear leg Lower body dressing/undressing: 3: Mod-Patient completed 50-74% of tasks  FIM - Toileting Toileting: 1: Total-Patient completed zero steps, helper did all 3 (per Cipriano MileKambi Kelly, NT report)  FIM - Toilet Transfers Toilet Transfers: 3-To toilet/BSC: Mod A (lift or lower assist), 3-From toilet/BSC: Mod A (lift or lower assist)  FIM - BankerBed/Chair Transfer Bed/Chair Transfer Assistive Devices: Arm rests Bed/Chair Transfer: 4: Supine > Sit: Min A (steadying Pt. > 75%/lift 1 leg), 4: Bed > Chair or W/C: Min A (steadying Pt. > 75%)  FIM - Locomotion: Wheelchair Distance: 150 Locomotion: Wheelchair: 5: Travels 150 ft or more: maneuvers on rugs and over door sills with supervision, cueing or coaxing FIM -  Locomotion: Ambulation Locomotion: Ambulation Assistive Devices: Chief Operating OfficerWalker - Hemi, Orthosis Ambulation/Gait  Assistance: 2: Max assist, 1: +2 Total assist Locomotion: Ambulation: 1: Two helpers  Comprehension Comprehension Mode: Auditory Comprehension: 4-Understands basic 75 - 89% of the time/requires cueing 10 - 24% of the time  Expression Expression Mode: Verbal Expression: 4-Expresses basic 75 - 89% of the time/requires cueing 10 - 24% of the time. Needs helper to occlude trach/needs to repeat words.  Social Interaction Social Interaction: 3-Interacts appropriately 50 - 74% of the time - May be physically or verbally inappropriate.  Problem Solving Problem Solving: 3-Solves basic 50 - 74% of the time/requires cueing 25 - 49% of the time  Memory Memory: 2-Recognizes or recalls 25 - 49% of the time/requires cueing 51 - 75% of the time  Medical Problem List and Plan: 1. Functional deficits secondary to R-MCA infarct question embolic v/s thrombotic with resultant left hemiplegia with sensory deficits, left facial weakness, left neglect and dysphagia. 2. DVT Prophylaxis/Anticoagulation: Pharmaceutical: Lovenox 3. Pain Management: tylenol for h/a for now.  topamax for post stroke headache, improved will reduce dose 4. Mood: Seems withdrawn--will monitor for now. LCSW to follow for support and evaluation.  5. Neuropsych: This patient is not capable of making decisions on his own behalf. 6. Skin/Wound Care: Routine pressure relief measures.  7. Fluids/Electrolytes/Nutrition: Monitor I/O. Follow up admission labs tomorrow 8. HTN: 9. H/o Alcohol abuse/Tobacco use: Will add nicotine patch as asking for his snuff. CIWA protocol. Continue folic acid and thiamine.  10. Dyslipidemia: 11.  Mild HyperK, ? Hemolysis , no obvious medication source, will recheck in am  12.  Hx subacute ankle fx (7-8 wks ago) recheck xrays, healing oblique fibular fx , non displaced, will need either ankle splint or AFO when OOB, f/u ortho (Dr Ophelia CharterYates) as outpt 13.  Spasticity increasing primarily L hamstring, start  zanaflex, monitor BP,  Botulinum should take effect next wk   LOS (Days) 15 A FACE TO FACE EVALUATION WAS PERFORMED  KIRSTEINS,ANDREW E 11/02/2014 7:29 AM

## 2014-11-02 NOTE — Progress Notes (Signed)
Occupational Therapy Session Note  Patient Details  Name: Cory [name] MRN: [id number] Date of Birth: 25-May-1961  Today's Date: 11/02/2014 OT Individual Time: 1355-1430 OT Individual Time Calculation (min): 35 min    Short Term Goals: Week 3:  OT Short Term Goal 1 (Week 3): progress towards long term goals based on estimated discharge date  Skilled Therapeutic Interventions/Progress Updates:  Upon entering room, pt supine in bed watching tv with no c/o pain. Pt incontinent of bladder prior to session secondary to pt pants saturated in urine. Supine > sit with supervision. Pt transferred squat pivot bed >wheelchair with Min A. Pt requiring verbal cues for safety awareness this session such as [name] arm placement and locking of wheelchair breaks. Pt requiring increased time for initiation of tasks and to answer questions during session. Pt washed peri area and buttocks with Mod A for standing balance and therapist assist to place [name] hand onto sink for weight bearing during task. Pt not wishing to put on clothing this session and donned hospital gown instead. Pt seated in wheelchair with arm tray and QRB donned and call bell within reach.  Therapy Documentation Precautions:  Precautions Precautions: Fall Precaution Comments: [name] inattention, brace on [name] ankle from recent fx Restrictions Weight Bearing Restrictions: No  See FIM for current functional status  Therapy/Group: Individual Therapy  [name], Cory [name] 11/02/2014, 3:25 PM

## 2014-11-03 ENCOUNTER — Inpatient Hospital Stay (HOSPITAL_COMMUNITY): Payer: Medicaid Other

## 2014-11-03 NOTE — Progress Notes (Signed)
Physical Therapy Session Note  Patient Details  Name: Cory [name] MRN: [id number] Date of Birth: 07-18-1961  Today's Date: 11/03/2014 PT Individual Time: 0800-0900 PT Individual Time Calculation (min): 60 min   Short Term Goals: Week 2:  PT Short Term Goal 1 (Week 2): Pt will perform bed mobility with HOB flat and without rails at min A level with min cues for attending to L side of body.  PT Short Term Goal 2 (Week 2): Pt will perform functional transfers with min A and min cues for safety and attention to L side of body.  PT Short Term Goal 3 (Week 2): Pt will self propel w/c 150' using R hemi technique at S level.  PT Short Term Goal 4 (Week 2): Pt will ambulate x 25' w/ LRAD at mod A level   Skilled Therapeutic Interventions/Progress Updates:   Pt presents in bed and agreeable to therapy. Extra time to perform bed mobility and assist to don shoes EOB (already had brace on L foot for ankle fracture). LOB posterior occurred but able to come back up to sitting position with cueing and extra time. Transferred to w/c with min A squat pivot with cues for hand and foot placement. W/c mobility training on unit and to/from therapy with S; cues for obstacle negotiation and use of RUE for hemi-technique as well as encouragement to continue mobility. Simulated car transfer to sedan height and SUV height with stand pivot technique with min to mod A for actual transfer and assist needed to manage LLE with mod verbal cues for initiation and technique. Reviewed correct sequence of w/c parts management and Pt requires extra time for all tasks and cueing for safe technique. Left up in w/c with safety belt in place.   Therapy Documentation Precautions:  Precautions Precautions: Fall Precaution Comments: L inattention, brace on L ankle from recent fx Restrictions Weight Bearing Restrictions: No Pain:  Denies pain.   See FIM for current functional status  Therapy/Group: Individual Therapy  Karolee StampsGray,  [facility]yliva Mee [facility] 11/03/2014, 9:42 AM

## 2014-11-03 NOTE — Progress Notes (Signed)
Occupational Therapy Session Note  Patient Details  Name: Cory [name] MRN: [id number] Date of Birth: January 26, 1961  Today's Date: 11/03/2014 OT Individual Time: 1000-1040 OT Individual Time Calculation (min): 40 min    Short Term Goals: Week 3:  OT Short Term Goal 1 (Week 3): progress towards long term goals based on estimated discharge date  Skilled Therapeutic Interventions/Progress Updates: ADL-retraining with focus on improved awareness, adapted bathing/dressing skills, and weight-shifting to left.   Pt received seated in w/c with safety belt and half-lap tray secured.   Pt requested shower this day without reservations or consideration of time limitations constrained by 30 min ADL.   Pt required total assist to manage w/c and remove lower body clothing, complicated by left ankle orthosis secured with laces.  Pt completed seated shower with poor attention to left lower body and requiring mod assist to maintain standing balance while he washed peri-area and buttocks.   Pt returned to w/c after bathing with min guard to transfer after setup.   Due to time limitation, OT provided max assist to dress lower body and min assist for upper body.   Pt dressed at sink while seated in w/c, standing supported by right UE during assisted lower body dressing.   Pt left in w/c at end of extended session with safety belt and half-lap tray reinstalled.    Pt was minimally expressive during session and required mod vc to elaborate on preferences during dialogue with therapist.   Pt affect appeared flat; difficult to assess if d/t depression, related affective disorder or cognitive impairment.       Therapy Documentation  Precautions:  Precautions Precautions: Fall Precaution Comments: L inattention, brace on L ankle from recent fx Restrictions Weight Bearing Restrictions: No  Pain: Pain Assessment Pain Assessment: No/denies pain  See FIM for current functional status  Therapy/Group: Individual  Therapy  [name] 11/03/2014, 12:03 PM

## 2014-11-04 ENCOUNTER — Inpatient Hospital Stay (HOSPITAL_COMMUNITY): Payer: Medicaid Other | Admitting: Occupational Therapy

## 2014-11-04 ENCOUNTER — Inpatient Hospital Stay (HOSPITAL_COMMUNITY): Payer: Medicaid Other | Admitting: Physical Therapy

## 2014-11-04 ENCOUNTER — Inpatient Hospital Stay (HOSPITAL_COMMUNITY): Payer: Medicaid Other | Admitting: Speech Pathology

## 2014-11-04 NOTE — Progress Notes (Signed)
Social Work Patient ID: Cory [name], male   DOB: 1961-11-07, 53 y.o.   MRN: [id number] Met with pt to have him sign conscent form for parole officer, he signed and this worker faxed to him.  Needed to justify pt here in the hospital and dates he was admitted. Have given a copy to pt for his records.  Spoke with daughter regarding questions she had both aware bed search could be out 100 miles.  Will let both know when have heard anything From facility willing to take pt with LOG form the hospital.

## 2014-11-04 NOTE — Progress Notes (Signed)
Physical Therapy Session Note  Patient Details  Name: Cory [name] MRN: [id number] Date of Birth: 07/16/1961  Today's Date: 11/04/2014 PT Individual Time: 1435-1535 PT Individual Time Calculation (min): 60 min   Short Term Goals: Week 2:  PT Short Term Goal 1 (Week 2): Pt will perform bed mobility with HOB flat and without rails at min A level with min cues for attending to L side of body.  PT Short Term Goal 2 (Week 2): Pt will perform functional transfers with min A and min cues for safety and attention to L side of body.  PT Short Term Goal 3 (Week 2): Pt will self propel w/c 150' using R hemi technique at S level.  PT Short Term Goal 4 (Week 2): Pt will ambulate x 25' w/ LRAD at mod A level   Skilled Therapeutic Interventions/Progress Updates:   Pt received supine in bed on phone, agreeable to therapy and finishing conversation in reasonable amount of time. Pt transferred supine <> sit with supervision and verbal cues for technique. Pt performed multiple squat pivot transfers bed <> w/c and w/c <> mat table with supervision and step-by-step verbal cues for initiation and safe wheelchair setup. Pt propelled w/c using R hemi technique with supervision and no cues for attending to left or obstacle negotiation, 2 x 150 ft. Gait training using hemiwalker 2 x 25 ft with mod-max A with therapist under LUE to promote upright posture and stabilizing LLE for knee extension in stance phase. Pt continues to flex B knees when attempting to advance LLE. Pt performed tall kneeling with bench in front for RUE support. Pt able to achieve upright posture with glute activation with verbal cues but unable to maintain. Pt with no balance reactions with reaching to L with RUE to facilitate increased L sided weightbearing. Standing in front of mat, pt performed step taps to 4" step using RLE with max HHA from rehab tech and therapist stabilizing LLE to facilitate knee extension. Pt negotiated up/down 5 stairs  using R rail and mod A with therapist under LUE, total cues for sequencing and pt repeatedly stepping with wrong foot. Pt returned to room and left semi reclined in bed with bed alarm on and call bell within reach.      Therapy Documentation Precautions:  Precautions Precautions: Fall Precaution Comments: L inattention, brace on L ankle from recent fx Restrictions Weight Bearing Restrictions: No Pain: Pain Assessment Pain Assessment: No/denies pain Locomotion : Ambulation Ambulation/Gait Assistance: 3: Mod assist;2: Max Lawyerassist Wheelchair Mobility Distance: 150   See FIM for current functional status  Therapy/Group: Individual Therapy  Kerney ElbeVarner, Simara Rhyner A 11/04/2014, 3:43 PM

## 2014-11-04 NOTE — Progress Notes (Signed)
[place] PHYSICAL MEDICINE & REHABILITATION     PROGRESS NOTE    Subjective/Complaints: No issues overnite, denied spasms, occ Left ankle pain   ROS limited due to mental status/affect  Objective: Vital Signs: Blood pressure 131/88, pulse 78, temperature 98.6 F (37 C), temperature source Oral, resp. rate 18, weight 77.4 kg (170 lb 10.2 oz), SpO2 99 %. No results found. No results for input(s): WBC, HGB, HCT, PLT in the last 72 hours. No results for input(s): NA, K, CL, GLUCOSE, BUN, CREATININE, CALCIUM in the last 72 hours.  Invalid input(s): CO CBG (last 3)  No results for input(s): GLUCAP in the last 72 hours.  Wt Readings from Last 3 Encounters:  10/23/14 77.4 kg (170 lb 10.2 oz)  10/15/14 79.289 kg (174 lb 12.8 oz)  09/03/14 86.183 kg (190 lb)    Physical Exam:  Constitutional: He is oriented to person, place, and time. He appears well-developed and well-nourished.  HENT:  Head: Normocephalic and atraumatic.  Eyes: Conjunctivae are normal. Pupils are equal, round, and reactive to light.   Neck: Normal range of motion. Neck supple.  Cardiovascular: Normal rate and regular rhythm.  Respiratory: Effort normal and breath sounds normal. No respiratory distress. He has no wheezes.  GI: Soft. Bowel sounds are normal.  Musculoskeletal: He exhibits mild pain with Left hip Int/ext rotation Neurological: He is alert and oriented to person, place, and time. Delayed processing.  Left facial weakness with mild dysarthria and left inattention.   Has poor insight and awareness of deficits and easily distracted . Motor strength 0/5 in left deltoid, biceps, triceps, 0/5 to?tr grip, hip flexor, trace knee extensor, 0 ankle dorsiflexion plantar flexion  5/5 on the right side in the same muscle groups.  Absent sensation in the left upper and left lower extremity.  Visual fields absent to confrontation testing on left side  Skin: Skin is warm and dry.  Psychiatric: engages more    Assessment/Plan: 1. Functional deficits secondary to right MCA infarct, ?thrombotic, which require 3+ hours per day of interdisciplinary therapy in a comprehensive inpatient rehab setting. Physiatrist is providing close team supervision and 24 hour management of active medical problems listed below. Physiatrist and rehab team continue to assess barriers to discharge/monitor patient progress toward functional and medical goals.  FIM: FIM - Bathing Bathing Steps Patient Completed: Chest, Left Arm, Abdomen, Front perineal area, Buttocks, Right upper leg, Right lower leg (including foot), Left upper leg Bathing: 3: Mod-Patient completes 5-7 2265f 10 parts or 50-74%  FIM - Upper Body Dressing/Undressing Upper body dressing/undressing steps patient completed: Thread/unthread right sleeve of pullover shirt/dresss Upper body dressing/undressing: 2: Max-Patient completed 25-49% of tasks FIM - Lower Body Dressing/Undressing Lower body dressing/undressing steps patient completed: Thread/unthread right underwear leg, Thread/unthread right pants leg Lower body dressing/undressing: 1: Total-Patient completed less than 25% of tasks  FIM - Toileting Toileting: 1: Total-Patient completed zero steps, helper did all 3 (per Cipriano MileKambi Kelly, NT report)  FIM - ArchivistToilet Transfers Toilet Transfers: 3-To toilet/BSC: Mod A (lift or lower assist), 3-From toilet/BSC: Mod A (lift or lower assist)  FIM - BankerBed/Chair Transfer Bed/Chair Transfer Assistive Devices: Arm rests, HOB elevated Bed/Chair Transfer: 4: Supine > Sit: Min A (steadying Pt. > 75%/lift 1 leg), 4: Bed > Chair or W/C: Min A (steadying Pt. > 75%)  FIM - Locomotion: Wheelchair Distance: 150 Locomotion: Wheelchair: 5: Travels 150 ft or more: maneuvers on rugs and over door sills with supervision, cueing or coaxing FIM - Locomotion: Ambulation Locomotion:  Ambulation Assistive Devices: Chief Operating OfficerWalker - Hemi, Orthosis Ambulation/Gait Assistance: 2: Max assist, 1: +2  Total assist Locomotion: Ambulation: 1: Two helpers  Comprehension Comprehension Mode: Auditory Comprehension: 4-Understands basic 75 - 89% of the time/requires cueing 10 - 24% of the time  Expression Expression Mode: Verbal Expression: 4-Expresses basic 75 - 89% of the time/requires cueing 10 - 24% of the time. Needs helper to occlude trach/needs to repeat words.  Social Interaction Social Interaction: 3-Interacts appropriately 50 - 74% of the time - May be physically or verbally inappropriate.  Problem Solving Problem Solving: 3-Solves basic 50 - 74% of the time/requires cueing 25 - 49% of the time  Memory Memory: 2-Recognizes or recalls 25 - 49% of the time/requires cueing 51 - 75% of the time  Medical Problem List and Plan: 1. Functional deficits secondary to R-MCA infarct question embolic v/s thrombotic with resultant left hemiplegia with sensory deficits, left facial weakness, left neglect and dysphagia. 2. DVT Prophylaxis/Anticoagulation: Pharmaceutical: Lovenox 3. Pain Management: tylenol for h/a for now.  topamax for post stroke headache, improved will reduce dose 4. Mood: Seems withdrawn--will monitor for now. LCSW to follow for support and evaluation.  5. Neuropsych: This patient is not capable of making decisions on his own behalf. 6. Skin/Wound Care: Routine pressure relief measures.  7. Fluids/Electrolytes/Nutrition: Monitor I/O. Follow up admission labs tomorrow 8. HTN: 9. H/o Alcohol abuse/Tobacco use: Will add nicotine patch as asking for his snuff. CIWA protocol. Continue folic acid and thiamine.  10. Dyslipidemia: 11.  Mild HyperK, ? Hemolysis , no obvious medication source, will recheck in am  12.  Hx subacute ankle fx (8-9 wks ago) recheck xrays, healing oblique fibular fx , non displaced, will need either ankle splint or AFO when OOB, f/u ortho (Dr Ophelia CharterYates) as outpt 13.  Spasticity increasing primarily L hamstring, start zanaflex, monitor BP,  Botulinum to  Hamstrings should take effect this wk , also develping Adductor spasticity     LOS (Days) 17 A FACE TO FACE EVALUATION WAS PERFORMED  Cory [name] 11/04/2014 7:19 AM

## 2014-11-04 NOTE — Progress Notes (Signed)
Speech Language Pathology Daily Session Note  Patient Details  Name: Cory SleightStephen Herder MRN: [id number] Date of Birth: 29-Jun-1961  Today's Date: 11/04/2014 SLP Individual Time: 1040-1140 SLP Individual Time Calculation (min): 60 min  Short Term Goals: Week 3: SLP Short Term Goal 1 (Week 3): Patient will recall and utilize recommended safe swallow strategies with Dys. 1 textures and nectar-thick liquids with Min vebral cues to minimize overt s/s of aspiration.  SLP Short Term Goal 2 (Week 3): Patient will demonstrate effecient mastication of Dys. 2 textures with Mod verbal cues for 3 consecutive sessions to demonstrate readiness for texture advancement. SLP Short Term Goal 3 (Week 3): Patient will attend to left of environment with Mod question cues. SLP Short Term Goal 4 (Week 3): Patient will demonstrate sustained attention to a basic task for 10 minutes with Min verbal cues for redirection. SLP Short Term Goal 5 (Week 3): Patient will label 2 physical and 2 cognitive deficits that have resulted from his CVA with Mod question cues.  SLP Short Term Goal 6 (Week 3): Patient will conume trials of thin liquids with no overt s/s of aspiration over 3 consecutive sessions to demonstrate readiness for a repeat objective swallow assessment.  Skilled Therapeutic Interventions: Skilled treatment session focused on dysphagia and cognitive goals. Patient required Max faded to Mod cues to problem solve wheelchair mobility and for left attention during route finding to SLP therapy.  Patient was educated on water protocol and consumed water via cup with Mod cues for small controlled sips which was effective at preventing overt aspiration in all but one trial.  Patient then participated in a texture trials of advanced Dys.2 textures with implementation of a mirror to promote visual feedback for left pocketing anterior loss which enabled SLP to fade cues to Min; previously Mod-Max assist.  Recommend repeat trial try  with SLP during lunch tomorrow; initiate Water Protocol today.     FIM:  Comprehension Comprehension Mode: Auditory Comprehension: 4-Understands basic 75 - 89% of the time/requires cueing 10 - 24% of the time Expression Expression Mode: Verbal Expression: 4-Expresses basic 75 - 89% of the time/requires cueing 10 - 24% of the time. Needs helper to occlude trach/needs to repeat words. Social Interaction Social Interaction: 3-Interacts appropriately 50 - 74% of the time - May be physically or verbally inappropriate. Problem Solving Problem Solving: 3-Solves basic 50 - 74% of the time/requires cueing 25 - 49% of the time Memory Memory: 3-Recognizes or recalls 50 - 74% of the time/requires cueing 25 - 49% of the time FIM - Eating Eating Activity: 5: Set-up assist for open containers;5: Set-up assist for cut food;5: Needs verbal cues/supervision;4: Helper checks for pocketed food  Pain Pain Assessment Pain Assessment: No/denies pain  Therapy/Group: Individual Therapy  Charlane FerrettiMelissa Rebekha Diveley, M.A., CCC-SLP [phone]  Bryse Blanchette 11/04/2014, 11:55 AM

## 2014-11-04 NOTE — Progress Notes (Signed)
Occupational Therapy Session Note  Patient Details  Name: Cory SleightStephen Tinkey MRN: [id number] Date of Birth: 1961/05/23  Today's Date: 11/04/2014 OT Individual Time: 0800-0900 OT Individual Time Calculation (min): 60 min   Short Term Goals: Week 3:  OT Short Term Goal 1 (Week 3): progress towards long term goals based on estimated discharge date  Skilled Therapeutic Interventions/Progress Updates:  Patient resting in bed upon arrival. Engaged in self care retraining to include sponge bath (declined shower), dress and groom.  Focused session on attention to left visual field and left side of body, awareness, sustained attention, management of LUE, hemi dressing techniques, bed mobility, safe squat pivot transfer traveling to his right, sit><stands, postural control and midline orientation.  Patient requires mod-max cues for all functional mobility and all BADL tasks to remain safe with his movement and to improve his independence.  Without verbal and tactile cues, patient is very unsafe.   Primarily due to visual perceptual issues-he manages to get tangled up in his clothes during dressing tasks.  Therapy Documentation Precautions:  Precautions Precautions: Fall Precaution Comments: L inattention, brace on L ankle from recent fx Restrictions Weight Bearing Restrictions: No Pain: No c/o pain except occasionally in left ankle in standing. Rest and repositioned ADL: See FIM for current functional status  Therapy/Group: Individual Therapy  Molli Gethers 11/04/2014, 10:36 AM

## 2014-11-05 ENCOUNTER — Inpatient Hospital Stay (HOSPITAL_COMMUNITY): Payer: Self-pay | Admitting: Rehabilitation

## 2014-11-05 ENCOUNTER — Encounter (HOSPITAL_COMMUNITY): Payer: Self-pay

## 2014-11-05 ENCOUNTER — Inpatient Hospital Stay (HOSPITAL_COMMUNITY): Payer: Medicaid Other | Admitting: Speech Pathology

## 2014-11-05 ENCOUNTER — Encounter: Payer: Self-pay | Admitting: Internal Medicine

## 2014-11-05 MED ORDER — TOPIRAMATE 25 MG PO TABS
25.0000 mg | ORAL_TABLET | Freq: Every day | ORAL | Status: DC
  Administered 2014-11-05 – 2014-11-12 (×8): 25 mg via ORAL
  Filled 2014-11-05 (×9): qty 1

## 2014-11-05 NOTE — Progress Notes (Signed)
Speech Language Pathology Daily Session Note  Patient Details  Name: Cory [name] MRN: [id number] Date of Birth: [birth year]  Today's Date: 11/05/2014 SLP Individual Time: 1191-47821104-1215 SLP Individual Time Calculation (min): 71 min  Short Term Goals: Week 3: SLP Short Term Goal 1 (Week 3): Patient will recall and utilize recommended safe swallow strategies with Dys. 1 textures and nectar-thick liquids with Min vebral cues to minimize overt s/s of aspiration.  SLP Short Term Goal 2 (Week 3): Patient will demonstrate effecient mastication of Dys. 2 textures with Mod verbal cues for 3 consecutive sessions to demonstrate readiness for texture advancement. SLP Short Term Goal 3 (Week 3): Patient will attend to left of environment with Mod question cues. SLP Short Term Goal 4 (Week 3): Patient will demonstrate sustained attention to a basic task for 10 minutes with Min verbal cues for redirection. SLP Short Term Goal 5 (Week 3): Patient will label 2 physical and 2 cognitive deficits that have resulted from his CVA with Mod question cues.  SLP Short Term Goal 6 (Week 3): Patient will conume trials of thin liquids with no overt s/s of aspiration over 3 consecutive sessions to demonstrate readiness for a repeat objective swallow assessment.  Skilled Therapeutic Interventions: Skilled treatment session focused on addressing dysphagia and cognitive goals. Patient required Mod question cues to recall procedures for Water Protocol; patient consumed water via cup with Min cues for small controlled sips which was effective at preventing overt aspiration.  Patient required Mod faded to Min multimodal cues for left attention during a structured letter identification task.  Upon lunch arriving patient required Mod cues to initiate and problem solve tray set-up as well as Mod verbal cues for overall pacing during self-feeding.  Patient consumed Dys.2 textures and nectar-thick liquids via cup with prolonged  mastication and Mod cues to attend to and manage left pocketing which was effective at preventing any overt s/s of aspiration.  Recommend to continue with advanced textures with full staff supervision.     FIM:  Comprehension Comprehension Mode: Auditory Comprehension: 4-Understands basic 75 - 89% of the time/requires cueing 10 - 24% of the time Expression Expression Mode: Verbal Expression: 5-Expresses basic 90% of the time/requires cueing < 10% of the time. Social Interaction Social Interaction: 3-Interacts appropriately 50 - 74% of the time - May be physically or verbally inappropriate. Problem Solving Problem Solving: 3-Solves basic 50 - 74% of the time/requires cueing 25 - 49% of the time Memory Memory: 3-Recognizes or recalls 50 - 74% of the time/requires cueing 25 - 49% of the time  Pain Pain Assessment Pain Assessment: No/denies pain  Therapy/Group: Individual Therapy  Charlane FerrettiMelissa Montez Stryker, M.A., CCC-SLP [phone]  [name] 11/05/2014, 1:23 PM

## 2014-11-05 NOTE — Progress Notes (Signed)
Physical Therapy Weekly Progress Note  Patient Details  Name: Cory [name] MRN: [id number] Date of Birth: March 08, 1961  Beginning of progress report period: October 28, 2014 End of progress report period: November 05, 2014  Today's Date: 11/05/2014 PT Individual Time: 1500-1600 PT Individual Time Calculation (min): 60 min   Patient has met 3 of 4 short term goals.  Pt continues to make slow but steady progress with therapy.  He has made progress with bed mobility, transfers, and w/c mobility, however continues to be greatly limited in all upright standing positions, therefore limiting amount of gait that can be functionally performed.  He continues to have increased hamstring tone in LLE, however is noted to be improved from Botox injection, however due to awareness deficits, decreased balance, decreased motor planning, and decreased attention to the L.   Patient continues to demonstrate the following deficits: decreased balance, decreased postural control, decreased motor planning, pain in L ankle, decreased strength in LUE/LE and therefore will continue to benefit from skilled PT intervention to enhance overall performance with activity tolerance, balance, postural control, ability to compensate for deficits, functional use of  left upper extremity and left lower extremity, attention, awareness, coordination and knowledge of precautions.  Patient progressing toward long term goals..  Goals were downgraded last week to reflect lack of progress and new plan for D/C to SNF.   PT Short Term Goals Week 2:  PT Short Term Goal 1 (Week 2): Pt will perform bed mobility with HOB flat and without rails at min A level with min cues for attending to L side of body.  PT Short Term Goal 1 - Progress (Week 2): Met PT Short Term Goal 2 (Week 2): Pt will perform functional transfers with min A and min cues for safety and attention to L side of body.  PT Short Term Goal 2 - Progress (Week 2): Met PT Short  Term Goal 3 (Week 2): Pt will self propel w/c 150' using R hemi technique at S level.  PT Short Term Goal 3 - Progress (Week 2): Met PT Short Term Goal 4 (Week 2): Pt will ambulate x 25' w/ LRAD at mod A level  PT Short Term Goal 4 - Progress (Week 2): Progressing toward goal Week 3:  PT Short Term Goal 1 (Week 3): =LTG's due to ELOS  Skilled Therapeutic Interventions/Progress Updates:   Pt received lying in bed, agreeable to therapy session.  Brother and sister present to observe session.  Performed bed mobility at S level with HOB flat and without rail to further challenge balance and simulate home.  Cues for safety and technique.  Once at EOB, assisted with donning shoes and L AFO prior to transfer, as well as pants per pt request.  Performed standing at min A level while pt assisted in pulling up pants.  Transferred to w/c from bed at S level with min cues for L foot placement and safety.  Pt self propelled to/from therapy gym x 150' x 2 at S level using R hemi technique.  Continued min cues for attention to the L, esp in tighter areas.  Skilled session focused on gait training with maxi sky and with eva walker for increased WB through LLE and upright posture, as well as standing activities for increased weight shift and WB through LLE.  Performed 40' gait x 2 reps, first rep with "three muskateer style" to facilitate increased upright posture.  Pt able to clear LLE on his own with assist to decrease  scissoring (compensates with trunk extension when fatigued), assist for increased glute activation and anterior pelvic tilt, as well as stability at L knee during stance.  Pt did much better today with remaining upright with use of mirror for visual feedback, advancing LLE and taking weight through LLE.  Progressed to eva walker x 40' with surgical shoe cover placed on LLE to assist with clearance of LLE.  Note marked improvement, but requires more assist for weight shift over LLE during stance and continued  cues to increase knee flex on RLE during swing phase as he tends to advanced RLE in locked position.  Ended session in // bars with pt propping RLE onto kay bench to increase upright posture, midline orientation and increased weight shift and WB through LLE.  Provided mirror again for visual feedback.  Max verbal cues for decreased use of RUE, as he would tend to pull all his weight to R side.  Performed several weight shifts with tapping to L quad for activation.  Brother providing encouragement to pt during session as pt became somewhat withdrawn at end of session.  Pt propelled back to room and PT donned quick release belt, L arm tray and foot rest so that sister could take pt outside.  RN aware.   Therapy Documentation Precautions:  Precautions Precautions: Fall Precaution Comments: L inattention, brace on L ankle from recent fx Restrictions Weight Bearing Restrictions: No   Vital Signs: Therapy Vitals Temp: 98.2 F (36.8 C) Temp Source: Oral Pulse Rate: 93 Resp: 17 BP: 118/71 mmHg Patient Position (if appropriate): Lying Oxygen Therapy SpO2: 100 % O2 Device: Not Delivered Pain: Pt with pain in L ankle following increased WB activity at end of session.    See FIM for current functional status  Therapy/Group: Individual Therapy and cotx with RT  Denice Bors 11/05/2014, 8:06 AM

## 2014-11-05 NOTE — Progress Notes (Signed)
Recreational Therapy Session Note  Patient Details  Name: Cory SleightStephen Zirbel MRN: [id number] Date of Birth: Apr 28, 1961 Today's Date: 11/05/2014  Pain: no c/o Skilled Therapeutic Interventions/Progress Updates: Session focused on activity tolerance, w/c mobility, attending to the left, standing balance & ambulation.  Pt propelled w/c from room to gym with supervision & mod verbal cues to avoid items on the left.  Pt ambulated in the Maxi sky with +2 assist using mirror for visual feedback.  Therapy/Group: Co-Treatment  Sara Keys 11/05/2014, 4:55 PM

## 2014-11-05 NOTE — Progress Notes (Signed)
Occupational Therapy Session Note  Patient Details  Name: Cory SleightStephen [name] MRN: [id number] Date of Birth: 03/22/1961  Today's Date: 11/05/2014 OT Individual Time: 1000-1100 OT Individual Time Calculation (min): 60 min    Short Term Goals: Week 3:  OT Short Term Goal 1 (Week 3): progress towards long term goals based on estimated discharge date  Skilled Therapeutic Interventions/Progress Updates:    Pt engaged in BADL retraining including bathing at shower level, dressing with sit<>stand from w/c at sink, and grooming tasks seated in w/c at sink.  Pt selected clothing prior to rolling into bathroom to transfer to tub bench in shower.  Pt required max verbal cues for BLE placement prior to squat pivot transfer to tub bench.  Pt required mod A when standing to pull up pants. Pt required min verbal cues for task initiation, sequencing, thoroughness, and attention to left.  Pt exhibited impulsivity when standing in shower and did not alert therapist before attempting to stand.  Pt required total assistance with orientation of tshirt prior to donning.  Focus on activity tolerance, sit<>stand, transfers, standing balance, attention to left, LUE use, and safety awareness.  Therapy Documentation Precautions:  Precautions Precautions: Fall Precaution Comments: L inattention, brace on L ankle from recent fx Restrictions Weight Bearing Restrictions: No   Pain: Pt denied pain    See FIM for current functional status  Therapy/Group: Individual Therapy  Rich BraveLanier, Yona Stansbury Chappell 11/05/2014, 11:59 AM

## 2014-11-05 NOTE — Progress Notes (Signed)
[facility]     PROGRESS NOTE    Subjective/Complaints: No new issus   ROS limited due to mental status/affect  Objective: Vital Signs: Blood pressure 118/71, pulse 93, temperature 98.2 F (36.8 C), temperature source Oral, resp. rate 17, weight 77.4 kg (170 lb 10.2 oz), SpO2 100 %. No results found. No results for input(s): WBC, HGB, HCT, PLT in the last 72 hours. No results for input(s): NA, K, CL, GLUCOSE, BUN, CREATININE, CALCIUM in the last 72 hours.  Invalid input(s): CO CBG (last 3)  No results for input(s): GLUCAP in the last 72 hours.  Wt Readings from Last 3 Encounters:  10/23/14 77.4 kg (170 lb 10.2 oz)  10/15/14 79.289 kg (174 lb 12.8 oz)  09/03/14 86.183 kg (190 lb)    Physical Exam:  Constitutional: He is oriented to person, place, and time. He appears well-developed and well-nourished.  HENT:  Head: Normocephalic and atraumatic.  Eyes: Conjunctivae are normal. Pupils are equal, round, and reactive to light.   Neck: Normal range of motion. Neck supple.  Cardiovascular: Normal rate and regular rhythm.  Respiratory: Effort normal and breath sounds normal. No respiratory distress. He has no wheezes.  GI: Soft. Bowel sounds are normal.  Musculoskeletal: He exhibits mild pain with Left hip Int/ext rotation Neurological: He is alert and oriented to person, place, and time. Delayed processing.  Left facial weakness with mild dysarthria and left inattention.   Has poor insight and awareness of deficits and easily distracted . Motor strength 0/5 in left deltoid, biceps, triceps, 0/5 to?tr grip, hip flexor, trace knee extensor, 0 ankle dorsiflexion plantar flexion  5/5 on the right side in the same muscle groups.  Absent sensation in the left upper and left lower extremity.  Visual fields absent to confrontation testing on left side  Skin: Skin is warm and dry.  Psychiatric: engages more   Assessment/Plan: 1. Functional deficits  secondary to right MCA infarct, ?thrombotic, which require 3+ hours per day of interdisciplinary therapy in a comprehensive inpatient rehab setting. Physiatrist is providing close team supervision and 24 hour management of active medical problems listed below. Physiatrist and rehab team continue to assess barriers to discharge/monitor patient progress toward functional and medical goals.  FIM: FIM - Bathing Bathing Steps Patient Completed: Chest, Left Arm, Abdomen, Front perineal area, Buttocks, Right upper leg Bathing: 3: Mod-Patient completes 5-7 [id number] 10 parts or 50-74%  FIM - Upper Body Dressing/Undressing Upper body dressing/undressing steps patient completed: Thread/unthread right sleeve of pullover shirt/dresss, Put head through opening of pull over shirt/dress, Pull shirt over trunk Upper body dressing/undressing: 4: Min-Patient completed 75 plus % of tasks FIM - Lower Body Dressing/Undressing Lower body dressing/undressing steps patient completed: Thread/unthread right pants leg, Thread/unthread left pants leg, Don/Doff right sock, Don/Doff left sock, Don/Doff right shoe, Don/Doff left shoe Lower body dressing/undressing: 3: Mod-Patient completed 50-74% of tasks  FIM - Toileting Toileting: 1: Total-Patient completed zero steps, helper did all 3 (per Cipriano MileKambi Kelly, NT report)  FIM - Toilet Transfers Toilet Transfers: 3-To toilet/BSC: Mod A (lift or lower assist), 3-From toilet/BSC: Mod A (lift or lower assist)  FIM - Press photographerBed/Chair Transfer Bed/Chair Transfer Assistive Devices: Arm rests, HOB elevated Bed/Chair Transfer: 5: Sit > Supine: Supervision (verbal cues/safety issues), 5: Supine > Sit: Supervision (verbal cues/safety issues), 5: Bed > Chair or W/C: Supervision (verbal cues/safety issues), 5: Chair or W/C > Bed: Supervision (verbal cues/safety issues)  FIM - Locomotion: Wheelchair Distance: 150 Locomotion: Wheelchair: 5:  Travels 150 ft or more: maneuvers on rugs and over door sills  with supervision, cueing or coaxing FIM - Locomotion: Ambulation Locomotion: Ambulation Assistive Devices: Chief Operating OfficerWalker - Hemi, Orthosis Ambulation/Gait Assistance: 3: Mod assist, 2: Max assist Locomotion: Ambulation: 1: Travels less than 50 ft with maximal assistance (Pt: 25 - 49%)  Comprehension Comprehension Mode: Auditory Comprehension: 4-Understands basic 75 - 89% of the time/requires cueing 10 - 24% of the time  Expression Expression Mode: Verbal Expression: 4-Expresses basic 75 - 89% of the time/requires cueing 10 - 24% of the time. Needs helper to occlude trach/needs to repeat words.  Social Interaction Social Interaction: 3-Interacts appropriately 50 - 74% of the time - May be physically or verbally inappropriate.  Problem Solving Problem Solving: 3-Solves basic 50 - 74% of the time/requires cueing 25 - 49% of the time  Memory Memory: 3-Recognizes or recalls 50 - 74% of the time/requires cueing 25 - 49% of the time  Medical Problem List and Plan: 1. Functional deficits secondary to R-MCA infarct question embolic v/s thrombotic with resultant left hemiplegia with sensory deficits, left facial weakness, left neglect and dysphagia. 2. DVT Prophylaxis/Anticoagulation: Pharmaceutical: Lovenox 3. Pain Management: tylenol for h/a for now.  topamax for post stroke headache, improved will wean 4. Mood: Seems withdrawn--will monitor for now. LCSW to follow for support and evaluation.  5. Neuropsych: This patient is not capable of making decisions on his own behalf. 6. Skin/Wound Care: Routine pressure relief measures.  7. Fluids/Electrolytes/Nutrition: Monitor I/O. Follow up admission labs tomorrow 8. HTN: 9. H/o Alcohol abuse/Tobacco use: Will add nicotine patch as asking for his snuff. CIWA protocol. Continue folic acid and thiamine.  10. Dyslipidemia: 11.  Mild HyperK, ? Hemolysis , no obvious medication source, will recheck in am  12.  Hx subacute ankle fx (8-9 wks ago) recheck  xrays, healing oblique fibular fx , non displaced, will need either ankle splint or AFO when OOB, f/u ortho (Dr Ophelia CharterYates) as outpt 13.  Spasticity increasing primarily L hamstring, start zanaflex, monitor BP,  Botulinum to Hamstrings should take effect this wk , also develping Adductor spasticity     LOS (Days) 18 A FACE TO FACE EVALUATION WAS PERFORMED  Irania Durell E 11/05/2014 6:44 AM

## 2014-11-06 ENCOUNTER — Inpatient Hospital Stay (HOSPITAL_COMMUNITY): Payer: Self-pay | Admitting: Rehabilitation

## 2014-11-06 ENCOUNTER — Inpatient Hospital Stay (HOSPITAL_COMMUNITY): Payer: Medicaid Other | Admitting: Occupational Therapy

## 2014-11-06 ENCOUNTER — Inpatient Hospital Stay (HOSPITAL_COMMUNITY): Payer: Medicaid Other | Admitting: Speech Pathology

## 2014-11-06 LAB — CREATININE, SERUM
Creatinine, Ser: 0.78 mg/dL (ref 0.50–1.35)
GFR calc Af Amer: >90 mL/min (ref >90)
GFR calc non Af Amer: >90 mL/min (ref >90)

## 2014-11-06 NOTE — Progress Notes (Signed)
Occupational Therapy Session Note  Patient Details  Name: Cory [name] MRN: [id number] Date of Birth: 12/10/1960  Today's Date: 11/06/2014 OT Individual Time: 0759-0859 and 1500- 1530 OT Individual Time Calculation (min): 60 min and 30 min   Short Term Goals: Week 2:  OT Short Term Goal 1 (Week 2): Pt will move/position arm in safe position during functional transfers and STS with min verbal guidance cues for increased safety awareness. OT Short Term Goal 1 - Progress (Week 2): Met OT Short Term Goal 2 (Week 2): Pt will donn B shoes and socks in order to increase I with LB dressing. OT Short Term Goal 2 - Progress (Week 2): Progressing toward goal OT Short Term Goal 3 (Week 2): Pt will locate 3 items to the L during B and D session with min verbal cues in order to increase attention to L during self care. OT Short Term Goal 3 - Progress (Week 2): Met OT Short Term Goal 4 (Week 2): Pt will perform toilet transfer with Min A in order to increase I in functional transfers OT Short Term Goal 4 - Progress (Week 2): Progressing toward goal Week 3:  OT Short Term Goal 1 (Week 3): progress towards long term goals based on estimated discharge date  Skilled Therapeutic Interventions/Progress Updates:  Session 1:  Upon entering room, pt supine in bed awaiting therapist arrival. Pt with no c/o pain this session. Pt transferred from bed to wheelchair with min A squat pivot. Transfer to wheelchair <> TTB with Min A and no use of grab bar. Pt required HOH assist for L UE to assist with washing R UE. Pt requiring verbal cues to initiate washing of L side this session as pt with focus on R LE during bathing. STS x 3 from wheelchair at sink side for dressing and grooming tasks with Min A. Pt requiring assistance to place L hand onto sink for weight bearing during these tasks. Pt seated in wheelchair with arm tray and QRB donned and call bell within reach upon exiting the room.   Session 2: Pt transitioned  easily from PT session with no c/o pain. Pt assisted via wheelchair to ADL kitchen. STS from wheelchair x 2 with Min A. Pt standing at counter with total A to get L UE placed into weight bearing position and block L knee while standing. OT also facilitation weigh shift onto L UE and L LE while obtaining items from kitchen cabinet with R hand. Pt able to weight bear during functional tasks for ~ 7 minutes before rest break. Pt required verbal cues for stand >sit for safety awareness to remove L hand from counter, reach back with R hand, and slow sit down into chair. Pt engaged in table slides for shoulder flexion, horizontal adduction/adduction, and internal/external rotation x 10 reps each with verbal cues for proper technique. OT also educated and demonstrated self ROM for L UE digits, wrist,and elbow in all planes with pt returning 5 reps of each. OT assisted pt back to room via wheelchair with supervision transfer back to bed. Bed alarm on and call bell within reach upon exiting the room.  Therapy Documentation Precautions:  Precautions Precautions: Fall Precaution Comments: L inattention, brace on L ankle from recent fx Restrictions Weight Bearing Restrictions: No  See FIM for current functional status  Therapy/Group: Individual Therapy  Pittman, Katie L 11/06/2014, 10:55 AM  

## 2014-11-06 NOTE — Progress Notes (Signed)
NT noted patient on his bedroom floor at 0515, and called for the nurse. Upon entering room, patient observed on his left side. Patient denied pain, denied hitting his head. Skin intact; no bruising noted. Vitals stable. Patient transferred back in bed with minimal assistance. Call light and personal items within reach. Bed alarm activated.

## 2014-11-06 NOTE — Patient Care Conference (Signed)
Inpatient RehabilitationTeam Conference and Plan of Care Update Date: 11/06/2014   Time: 12;00 PM    Patient Name: Cory [name]      Medical Record Number: [id number]  Date of Birth: [birth year] Sex: Male         Room/Bed: 4M08C/4M08C-01 Payor Info: Payor: MEDICAID PENDING / Plan: MEDICAID PENDING / Product Type: *No Product type* /    Admitting Diagnosis: R MCA INFARCT  Admit Date/Time:  10/18/2014  4:07 PM Admission Comments: No comment available   Primary Diagnosis:  Right middle cerebral artery stroke Principal Problem: Right middle cerebral artery stroke  Patient Active Problem List   Diagnosis Date Noted  . 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 Left-sided neglect 10/23/2014  . Alterations of sensations following CVA (cerebrovascular accident) 10/23/2014  . CVA (cerebral infarction) 10/18/2014  . Right middle cerebral artery stroke   . Dyslipidemia   . Acute CVA (cerebrovascular accident) 10/15/2014  . Stroke 10/15/2014  . Alcoholism 10/15/2014    Expected Discharge Date: Expected Discharge Date: 11/06/14  Team Members Present: Physician leading conference: Dr. Claudette LawsAndrew Kirsteins Social Worker Present: Dossie DerBecky Tulani Kidney, LCSW Nurse Present: Carmie EndAngie Joyce, RN PT Present: Edman CircleAudra Hall, PT;Emily Parcell, PT;Amayra Kiedrowski Evelina BucyVarner, PT OT Present: Callie FieldingKatie Pittman, Heath LarkT;James McGuire, OT SLP Present: Jackalyn LombardNicole Page, SLP PPS Coordinator present : Tora DuckMarie Noel, RN, CRRN     Current Status/Progress Goal Weekly Team Focus  Medical   Tone improved in LLE hamstrings, adductor increased, incont  D/C to SNF  eval urinary inc   Bowel/Bladder   Continent of bowel and bladder; LBM 12/16; patient receives 2 senokot at bedtime  Remain continent of bowel and bladder  Offer bathroom PRN   Swallow/Nutrition/ Hydration   Dys. 2 textures and nectar-thick liquids with full supervision and Mod cues  least restrictive PO with Min assist  trials of thin liquids; emergent  awareness, and use of compensatory strategies    ADL's             Mobility   S for bed mobility, close S for level transfers, min A for standing, min/mod for dynamic standing, max A gait with hemi walker.  Continues to be very limited by decreased awareness and safety during all mobility.    min/mod overall  L NMR, attention to the L, transfers, gait, safety awareness   Communication   Supervision-Min assist   Supervision   continue to increase vocal intensity    Safety/Cognition/ Behavioral Observations  Mod assist   Min assist   continue to increase left attention and overall safety awareness   Pain   Denies  </=3  Monitor pain qshift and PRN   Skin   Healing incision to left chest from loop recorder normal no issues No new skin breakdown monitor skin Assess skin and incision qshift and PRN      *See Care Plan and progress notes for long and short-term goals.  Barriers to Discharge: no caregiver support    Possible Resolutions to Barriers:  SNF placement    Discharge Planning/Teaching Needs:  [facility] looking for a bed, making progress in therapies.  Medicaid and Disability pending      Team Discussion:  Botox injected in hamstring-doing better from. Increased awareness on his left side. Check UA for UTI. Still min/mod level of assist. Looking for NH bed  Revisions to Treatment Plan:  [facility] unisured   Continued Need for Acute Rehabilitation Level of Care: The patient requires daily medical management by a physician with specialized training  in physical medicine and rehabilitation for the following conditions: Daily direction of a multidisciplinary physical rehabilitation program to ensure safe treatment while eliciting the highest outcome that is of practical value to the patient.: Yes Daily medical management of patient stability for increased activity during participation in an intensive rehabilitation regime.: Yes Daily analysis of laboratory values and/or radiology reports with  any subsequent need for medication adjustment of medical intervention for : Neurological problems  Lucy ChrisDupree, Lukah Goswami G 11/07/2014, 3:43 PM

## 2014-11-06 NOTE — Progress Notes (Signed)
[facility] PHYSICAL MEDICINE & REHABILITATION     PROGRESS NOTE    Subjective/Complaints: Denies Spasms on Left side but cont with poor awareness   ROS limited due to mental status/affect  Objective: Vital Signs: Blood pressure 122/88, pulse 79, temperature 98.3 F (36.8 C), temperature source Oral, resp. rate 17, weight 77.4 kg (170 lb 10.2 oz), SpO2 100 %. No results found. No results for input(s): WBC, HGB, HCT, PLT in the last 72 hours. No results for input(s): NA, K, CL, GLUCOSE, BUN, CREATININE, CALCIUM in the last 72 hours.  Invalid input(s): CO CBG (last 3)  No results for input(s): GLUCAP in the last 72 hours.  Wt Readings from Last 3 Encounters:  10/23/14 77.4 kg (170 lb 10.2 oz)  10/15/14 79.289 kg (174 lb 12.8 oz)  09/03/14 86.183 kg (190 lb)    Physical Exam:  Constitutional: He is oriented to person, place, and time. He appears well-developed and well-nourished.  HENT:  Head: Normocephalic and atraumatic.  Eyes: Conjunctivae are normal. Pupils are equal, round, and reactive to light.   Neck: Normal range of motion. Neck supple.  Cardiovascular: Normal rate and regular rhythm.  Respiratory: Effort normal and breath sounds normal. No respiratory distress. He has no wheezes.  GI: Soft. Bowel sounds are normal.  Musculoskeletal: He exhibits mild pain with Left hip Int/ext rotation Neurological: He is alert and oriented to person, place, and time. Delayed processing.  Left facial weakness with mild dysarthria and left inattention.   Has poor insight and awareness of deficits and easily distracted . Motor strength 0/5 in left deltoid, biceps, triceps, 0/5 to?tr grip, hip flexor, trace knee extensor, 0 ankle dorsiflexion plantar flexion  5/5 on the right side in the same muscle groups.  Absent sensation in the left upper and left lower extremity.  Visual fields absent to confrontation testing on left side  Skin: Skin is warm and dry.    Assessment/Plan: 1.  Functional deficits secondary to right MCA infarct, ?thrombotic, which require 3+ hours per day of interdisciplinary therapy in a comprehensive inpatient rehab setting. Physiatrist is providing close team supervision and 24 hour management of active medical problems listed below. Physiatrist and rehab team continue to assess barriers to discharge/monitor patient progress toward functional and medical goals. Team conference today please see physician documentation under team conference tab, met with team face-to-face to discuss problems,progress, and goals. Formulized individual treatment plan based on medical history, underlying problem and comorbidities.FIM: FIM - Bathing Bathing Steps Patient Completed: Chest, Left Arm, Abdomen, Front perineal area, Buttocks, Right upper leg, Left upper leg Bathing: 3: Mod-Patient completes 5-7 75f 10 parts or 50-74%  FIM - Upper Body Dressing/Undressing Upper body dressing/undressing steps patient completed: Thread/unthread right sleeve of pullover shirt/dresss, Put head through opening of pull over shirt/dress, Pull shirt over trunk Upper body dressing/undressing: 4: Min-Patient completed 75 plus % of tasks FIM - Lower Body Dressing/Undressing Lower body dressing/undressing steps patient completed: Thread/unthread right underwear leg, Thread/unthread left underwear leg, Thread/unthread right pants leg, Thread/unthread left pants leg, Don/Doff left sock, Don/Doff right sock, Don/Doff right shoe Lower body dressing/undressing: 3: Mod-Patient completed 50-74% of tasks  FIM - Toileting Toileting: 1: Total-Patient completed zero steps, helper did all 3 (per Gerrit Halls, NT report)  FIM - Toilet Transfers Toilet Transfers: 3-To toilet/BSC: Mod A (lift or lower assist), 3-From toilet/BSC: Mod A (lift or lower assist)  FIM - Engineer, site Assistive Devices: Arm rests, HOB elevated Bed/Chair Transfer: 5: Supine > Sit:  Supervision (verbal  cues/safety issues), 5: Bed > Chair or W/C: Supervision (verbal cues/safety issues)  FIM - Locomotion: Wheelchair Distance: 150 Locomotion: Wheelchair: 5: Travels 150 ft or more: maneuvers on rugs and over door sills with supervision, cueing or coaxing FIM - Locomotion: Ambulation Locomotion: Ambulation Assistive Devices: Orthosis, MaxiSky, Ethelene Hal Ambulation/Gait Assistance: 1: +2 Total assist Locomotion: Ambulation: 1: Two helpers  Comprehension Comprehension Mode: Auditory Comprehension: 4-Understands basic 75 - 89% of the time/requires cueing 10 - 24% of the time  Expression Expression Mode: Verbal Expression Assistive Devices: 6-Talk trach valve Expression: 5-Expresses basic 90% of the time/requires cueing < 10% of the time.  Social Interaction Social Interaction Mode: Asleep Social Interaction: 3-Interacts appropriately 50 - 74% of the time - May be physically or verbally inappropriate.  Problem Solving Problem Solving Mode: Not assessed Problem Solving: 3-Solves basic 50 - 74% of the time/requires cueing 25 - 49% of the time  Memory Memory Mode: Asleep Memory Assistive Devices: Memory book Memory: 3-Recognizes or recalls 50 - 74% of the time/requires cueing 25 - 49% of the time  Medical Problem List and Plan: 1. Functional deficits secondary to R-MCA infarct question embolic v/s thrombotic with resultant left hemiplegia with sensory deficits, left facial weakness, left neglect and dysphagia. 2. DVT Prophylaxis/Anticoagulation: Pharmaceutical: Lovenox 3. Pain Management: tylenol for h/a for now.  topamax for post stroke headache, improved will wean 4. Mood: Seems withdrawn--will monitor for now. LCSW to follow for support and evaluation.  5. Neuropsych: This patient is not capable of making decisions on his own behalf. 6. Skin/Wound Care: Routine pressure relief measures.  7. Fluids/Electrolytes/Nutrition: Monitor I/O. Follow up admission labs tomorrow 8.  HTN: 9. H/o Alcohol abuse/Tobacco use: May d/c CIWA protocol. No signs of WD for several weeks 10. Dyslipidemia: 11.  Mild HyperK, ? Hemolysis , no obvious medication source, will recheck in am  12.  Hx subacute ankle fx (8-9 wks ago) recheck xrays, healing oblique fibular fx , non displaced, will need either ankle splint or AFO when OOB, f/u ortho (Dr Lorin Mercy) as outpt 13.  Spasticity increasing primarily L hamstring, start zanaflex, monitor BP,  Botulinum to Hamstrings should take effect this wk , also develping Adductor spasticity     LOS (Days) 19 A FACE TO FACE EVALUATION WAS PERFORMED  Cory [name] E 11/06/2014 6:53 AM

## 2014-11-06 NOTE — Progress Notes (Signed)
Speech Language Pathology Daily Session Note  Patient Details  Name: Cory [name] MRN: [id number] Date of Birth: [birth year]  Today's Date: 11/06/2014 SLP Individual Time: 1130-1200 SLP Individual Time Calculation (min): 30 min  Short Term Goals: Week 3: SLP Short Term Goal 1 (Week 3): Patient will recall and utilize recommended safe swallow strategies with Dys. 1 textures and nectar-thick liquids with Min vebral cues to minimize overt s/s of aspiration.  SLP Short Term Goal 2 (Week 3): Patient will demonstrate effecient mastication of Dys. 2 textures with Mod verbal cues for 3 consecutive sessions to demonstrate readiness for texture advancement. SLP Short Term Goal 3 (Week 3): Patient will attend to left of environment with Mod question cues. SLP Short Term Goal 4 (Week 3): Patient will demonstrate sustained attention to a basic task for 10 minutes with Min verbal cues for redirection. SLP Short Term Goal 5 (Week 3): Patient will label 2 physical and 2 cognitive deficits that have resulted from his CVA with Mod question cues.  SLP Short Term Goal 6 (Week 3): Patient will conume trials of thin liquids with no overt s/s of aspiration over 3 consecutive sessions to demonstrate readiness for a repeat objective swallow assessment.  Skilled Therapeutic Interventions: Skilled treatment session focused on addressing dysphagia goals. Patient required Min question question cues to recall safe swallow strategies and Mod instructional cues to initiate and problem solve tray set-up as well as utilize safe swallow strategies during self-feeding. Patient consumed Dys.2 textures and nectar-thick liquids via straw with prolonged mastication; however, cues to attend to and manage left pocketing were effective at preventing any overt s/s of aspiration. Continue with current plan of care.    FIM:  Comprehension Comprehension Mode: Auditory Comprehension: 4-Understands basic 75 - 89% of the time/requires  cueing 10 - 24% of the time Expression Expression Mode: Verbal Expression: 5-Expresses basic 90% of the time/requires cueing < 10% of the time. Social Interaction Social Interaction: 4-Interacts appropriately 75 - 89% of the time - Needs redirection for appropriate language or to initiate interaction. Problem Solving Problem Solving: 3-Solves basic 50 - 74% of the time/requires cueing 25 - 49% of the time Memory Memory: 3-Recognizes or recalls 50 - 74% of the time/requires cueing 25 - 49% of the time  Pain Pain Assessment Pain Assessment: No/denies pain  Therapy/Group: Individual Therapy  Charlane FerrettiMelissa Maven Varelas, M.A., CCC-SLP [phone]  Antonisha Waskey 11/06/2014, 1:58 PM

## 2014-11-06 NOTE — Progress Notes (Signed)
   11/06/14 0515  What Happened  Point of contact other (comment) (Patient found lying on his left side)

## 2014-11-06 NOTE — Progress Notes (Signed)
   11/06/14 0515  What Happened  Was fall witnessed? No  Patients activity before fall other (comment) (Patient in bed, attempted to get oob to get clothes.)  Point of contact other (comment) (Patient found lying on his left side)  Was patient injured? No  Patient found on floor  Found by Staff-comment  Stated prior activity other (comment) (In bed, attempted to get oob for clothes)  Follow Up  MD notified Deatra Inaan Angiulli, PA  Time MD notified [phone]  Family notified Yes-comment  Time family notified [phone]  Additional tests No  Simple treatment Other (comment) (N/A)  Progress note created (see row info) Yes  Adult Fall Risk Assessment  Risk Factor Category (scoring not indicated) High fall risk per protocol (document High fall risk)  Patient's Fall Risk High Fall Risk (>13 points)  Adult Fall Risk Interventions  Required Bundle Interventions *See Row Information* High fall risk - low, moderate, and high requirements implemented  Additional Interventions Fall risk signage;Other (Comment) (Be sure bed alarm is set)

## 2014-11-06 NOTE — Progress Notes (Signed)
Physical Therapy Session Note  Patient Details  Name: Cory [name] MRN: [id number] Date of Birth: 09/05/1961  Today's Date: 11/06/2014 PT Individual Time: 1400-1500 PT Individual Time Calculation (min): 60 min   Short Term Goals: Week 3:  PT Short Term Goal 1 (Week 3): =LTG's due to ELOS  Skilled Therapeutic Interventions/Progress Updates:   Pt received sitting in w/c in room.  Discussed his fall earlier in the morning and continued safety concerns.  Requires max verbal cues to take falling seriously and the need to call for assist and wait for them to come in order to prevent injury that would set back his progress.  Pt verbalized understanding.  Pt self propelled to/from therapy gym x 150' x 2 reps using R hemi technique at S level.  Continues to require mod verbal cues for avoiding obstacles on the L, as well as cues to attend to task, as he is easily distracted externally.  Once in therapy gym, began session with w/c set up and performing squat pivot transfers for nurse training. Performed transfers R and L at S level with single instance of needing min/guard assist when transferring to the L.  Requires min cues only for removal of parts and mod cues for w/c positioning.  Also worked on supine<>sit at S to min/guard assist with focus on trunk flex/rotation.  Then worked on gait x 30' with L AFO, shoe cover, mirror for visual feedback with use of hemi walker at mod/max A.  Continued cues for increased R knee flexion during swing phase of gait, upright posture, facilitation for L weight shift forward and over LLE during L stance and assist to to prevent adduction of LLE.  Ended session with standing tasks in squatted position for increased L LE weight bearing and sit<>stand with small block under RLE with decreased use of RUE for increased weight shift and WB through LLE.  Requires max A for task.  Pt self propelled back towards room and was handed off to OT.    Therapy  Documentation Precautions:  Precautions Precautions: Fall Precaution Comments: L inattention, brace on L ankle from recent fx Restrictions Weight Bearing Restrictions: No   Pain: No reports of pain during session.    See FIM for current functional status  Therapy/Group: Individual Therapy  [name], Shahiem Bedwell Ann 11/06/2014, 7:12 PM

## 2014-11-07 ENCOUNTER — Inpatient Hospital Stay (HOSPITAL_COMMUNITY): Payer: Self-pay | Admitting: Rehabilitation

## 2014-11-07 ENCOUNTER — Inpatient Hospital Stay (HOSPITAL_COMMUNITY): Payer: Medicaid Other | Admitting: Speech Pathology

## 2014-11-07 ENCOUNTER — Inpatient Hospital Stay (HOSPITAL_COMMUNITY): Payer: Medicaid Other | Admitting: Occupational Therapy

## 2014-11-07 LAB — URINALYSIS, ROUTINE W REFLEX MICROSCOPIC
BILIRUBIN URINE: NEGATIVE
GLUCOSE, UA: NEGATIVE mg/dL
Hgb urine dipstick: NEGATIVE
KETONES UR: NEGATIVE mg/dL
Nitrite: NEGATIVE
Protein, ur: NEGATIVE mg/dL
Specific Gravity, Urine: 1.029 (ref 1.005–1.030)
Urobilinogen, UA: 0.2 mg/dL (ref 0.0–1.0)
pH: 5.5 (ref 5.0–8.0)

## 2014-11-07 LAB — URINE MICROSCOPIC-ADD ON

## 2014-11-07 NOTE — Progress Notes (Signed)
Speech Language Pathology Daily Session Note  Patient Details  Name: Naida SleightStephen Vanvleck MRN: [id number] Date of Birth: 1960-12-03  Today's Date: 11/07/2014 SLP Individual Time: 1030-1130 SLP Individual Time Calculation (min): 60 min  Short Term Goals: Week 3: SLP Short Term Goal 1 (Week 3): Patient will recall and utilize recommended safe swallow strategies with Dys. 1 textures and nectar-thick liquids with Min vebral cues to minimize overt s/s of aspiration.  SLP Short Term Goal 2 (Week 3): Patient will demonstrate effecient mastication of Dys. 2 textures with Mod verbal cues for 3 consecutive sessions to demonstrate readiness for texture advancement. SLP Short Term Goal 3 (Week 3): Patient will attend to left of environment with Mod question cues. SLP Short Term Goal 4 (Week 3): Patient will demonstrate sustained attention to a basic task for 10 minutes with Min verbal cues for redirection. SLP Short Term Goal 5 (Week 3): Patient will label 2 physical and 2 cognitive deficits that have resulted from his CVA with Mod question cues.  SLP Short Term Goal 6 (Week 3): Patient will conume trials of thin liquids with no overt s/s of aspiration over 3 consecutive sessions to demonstrate readiness for a repeat objective swallow assessment.  Skilled Therapeutic Interventions: Skilled treatment session focused on addressing dysphagia and cognition goals. Patient required Min question cues to recall and utilize safe swallow strategies during trials of water via cup; SLP cues were effective at preventing overt s/s of aspiration throughout trials.  SLP also facilitated session with 4 step picture sequencing task with Mod verbal and visual cues for left attention, sequencing and inhibiting perseverative errors.  Continue with current plan of care.   FIM:  Comprehension Comprehension Mode: Auditory Comprehension: 4-Understands basic 75 - 89% of the time/requires cueing 10 - 24% of the  time Expression Expression Mode: Verbal Expression: 5-Expresses basic 90% of the time/requires cueing < 10% of the time. Social Interaction Social Interaction: 4-Interacts appropriately 75 - 89% of the time - Needs redirection for appropriate language or to initiate interaction. Problem Solving Problem Solving: 3-Solves basic 50 - 74% of the time/requires cueing 25 - 49% of the time Memory Memory: 3-Recognizes or recalls 50 - 74% of the time/requires cueing 25 - 49% of the time  Pain Pain Assessment Pain Assessment: No/denies pain  Therapy/Group: Individual Therapy  Charlane FerrettiMelissa Kupono Marling, M.A., CCC-SLP [phone]  Greggory Safranek 11/07/2014, 12:11 PM

## 2014-11-07 NOTE — Progress Notes (Signed)
Physical Therapy Session Note  Patient Details  Name: Cory [name] MRN: [id number] Date of Birth: January 23, 1961  Today's Date: 11/07/2014 PT Individual Time: 0930-1030 PT Individual Time Calculation (min): 60 min   Short Term Goals: Week 3:  PT Short Term Goal 1 (Week 3): =LTG's due to ELOS  Skilled Therapeutic Interventions/Progress Updates:   Pt received sitting in w/c in room, agreeable to therapy session.  Pt self propelled to/from therapy gym x 150' x 2 reps with R hemi technique.  Min cues for scanning L environment, but is overall improved.  Session focused on dynamic sitting and standing for NMR through LLE/LUE with WB and weight shifting to the L.  Also worked on gait with use of hemi walker with L AFO and shoe cover and use of mirror for visual feedback.  Once in therapy gym, pt transferred to/from therapy mat at S level with min cues for L foot placement and mod cues for w/c set up.  Worked on standing task with reaching up and to the L for increased WB and weight shift through LLE.  Provided L HHA, assist around hips for anterior pelvic tilt and assist at L knee for knee extension, however he continues to demonstrate intermittent quad and glute activation.  Progressed to sitting while therapist assisted with L wrist flexor stretch, L bicep stretch with manual stretching and through WB to decrease tone.  Had to ease into stretching due to pain.  Then progressed to reaching over extended L arm with reaching task.  Cues for pt to return to midline each time instead of R lateral lean.  Ended session with gait x 30' x 1 and another 25' x 1 with hemi walker, L AFO and shoe cover for increased DF assist and foot clearance.  Max A and max A cues for upright posture, forward translation over LLE, stability at L knee, and bending R knee through R swing phase of gait.  Pt assisted back to chair and propelled back to room.  Left with quick release belt donned, and half lap try on w/c.  All needs in  reach.   Therapy Documentation Precautions:  Precautions Precautions: Fall Precaution Comments: L inattention, brace on L ankle from recent fx Restrictions Weight Bearing Restrictions: No   Pain: Pt with min c/o pain in L wrist with stretching, adjusted as needed.    Locomotion : Ambulation Ambulation/Gait Assistance: 3: Mod assist;2: Max Lawyerassist Wheelchair Mobility Distance: 150   See FIM for current functional status  Therapy/Group: Individual Therapy  [name], [name] 11/07/2014, 11:48 AM

## 2014-11-07 NOTE — Progress Notes (Signed)
Occupational Therapy Weekly Progress Note and treatment session  Patient Details  Name: Cory SleightStephen Koltz MRN: [id number] Date of Birth: 12-19-1960  Beginning of progress report period: November 01, 2014 End of progress report period: November 08, 2014  Today's Date: 11/08/2014 OT Individual Time: [id number] OT Individual Time Calculation (min): 60 min    Pt continues to make slow but steady progress with therapy. Pt has made progress in the areas of functional transfers, standing balance, self care - hemiplegic dressing techniques, and bed mobility. Pt does continue to have deficits with increased tone in L UE and L LE with recent botox injection in LE. Pt does not trace of movement in L UE at this time.  Patient continues to demonstrate the following deficits: decreased I in self care, standing balance, decreased strength/AROM in L UE and L LE, L inattention, decreased safety awareness, increased impulsivity and therefore will continue to benefit from skilled OT intervention to enhance overall performance with BADL.  Patient progressing toward long term goals..  Continue plan of care. Goals discharged last week secondary to anticipated discharge to SNF with rehab. However, pt may be discharging home with assist from family/caregiver but is undecided at this time.   OT Short Term Goals Week 4:  OT Short Term Goal 1 (Week 4): STGs = LTGs secondary to upcoming discharge  Skilled Therapeutic Interventions/Progress Updates:    Upon entering the room, pt supine in bed with no c/o pain this session. Skilled OT session with focus on L inattention, weight bearing, incorporating L UE in functional tasks, standing balance, hemiplegic techniques for self care, functional transfers, and safety awareness. Pt performing supine > sit with close supervision as well as transfer from bed>wheelchair. Pt requiring set up for wheelchair for tub transfer wit min verbal cues to lock brakes. Pt transferring with Min  A squat pivot without use of grab bar wheelchair <> TTB. Pt utilizing R UE to assist L UE in washing parts of chest and B upper legs. Pt performing dressing tasks seated in wheelchair at sink side. Pt able to perform more of LB dressing task this session with utilizing small stool for feet to bring closer to pt for threading. Pt also able to use technique of crossing legs for increased success as well. Pt seated in wheelchair with QRB and arm tray donned with call bell within reach upon exiting the room.   Therapy Documentation Precautions:  Precautions Precautions: Fall Precaution Comments: L inattention, brace on L ankle from recent fx Restrictions Weight Bearing Restrictions: No  See FIM for current functional status  Therapy/Group: Individual Therapy  [name], Shiesha Jahn L 11/07/2014, 8:53 PM

## 2014-11-07 NOTE — Progress Notes (Signed)
Occupational Therapy Session Note  Patient Details  Name: Cory [name] MRN: [id number] Date of Birth: 11-08-1961  Today's Date: 11/07/2014 OT Individual Time: [id number] OT Individual Time Calculation (min): 60 min    Short Term Goals: Week 3:  OT Short Term Goal 1 (Week 3): progress towards long term goals based on estimated discharge date  Skilled Therapeutic Interventions/Progress Updates:  Upon entering room, pt supine in bed awaiting therapist arrival with no c/o pain. Skilled OT session with focus on self care, functional transfer, weight bearing during functional task, STS, and safety awareness. Supine >sit with supervision for safety. Pt performed squat pivot from bed >wheelchair with steady assist for safety. Transfer wheelchair <> TTB without use of grab bar with Min A and verbal cues for proper technique. Pt continued to perseverate on washing R LE. Pt requiring max verbal cues for initiation to wash other parts. HOH to incorporate L UE into bathing tasks. One bathing completed, dressing performed at sink side. Weight bearing through L UE while standing to pull up pants with increased difficulty and pain secondary to increased tone. STS x 4 with Min A and pt requiring verbal cues for safety such as locking brakes. Pt seated in wheelchair with QRB and arm tray donned and call bell within reach.  Therapy Documentation Precautions:  Precautions Precautions: Fall Precaution Comments: L inattention, brace on L ankle from recent fx Restrictions Weight Bearing Restrictions: No Pain: Pain Assessment Pain Assessment: No/denies pain  See FIM for current functional status  Therapy/Group: Individual Therapy  [name], Toluwani Yadav L 11/07/2014, 12:26 PM

## 2014-11-08 ENCOUNTER — Inpatient Hospital Stay (HOSPITAL_COMMUNITY): Payer: Medicaid Other | Admitting: Speech Pathology

## 2014-11-08 ENCOUNTER — Inpatient Hospital Stay (HOSPITAL_COMMUNITY): Payer: Medicaid Other | Admitting: Occupational Therapy

## 2014-11-08 ENCOUNTER — Inpatient Hospital Stay (HOSPITAL_COMMUNITY): Payer: Self-pay | Admitting: Rehabilitation

## 2014-11-08 LAB — BASIC METABOLIC PANEL
ANION GAP: 13 (ref 5–15)
BUN: 16 mg/dL (ref 6–23)
CHLORIDE: 104 meq/L (ref 96–112)
CO2: 22 meq/L (ref 19–32)
Calcium: 9.7 mg/dL (ref 8.4–10.5)
Creatinine, Ser: 0.87 mg/dL (ref 0.50–1.35)
GFR calc non Af Amer: >90 mL/min (ref >90)
Glucose, Bld: 96 mg/dL (ref 70–99)
POTASSIUM: 4.2 meq/L (ref 3.7–5.3)
Sodium: 139 mEq/L (ref 137–147)

## 2014-11-08 NOTE — Plan of Care (Signed)
Problem: RH Car Transfers Goal: LTG Patient will perform car transfers with assist (PT) LTG: Patient will perform car transfers with assistance (PT).  Added car transfer goal back due to possible D/C  Home.   Problem: RH Furniture Transfers Goal: LTG Patient will perform furniture transfers w/assist (OT/PT LTG: Patient will perform furniture transfers with assistance (OT/PT).  Added goal back due to possible DC home.

## 2014-11-08 NOTE — Progress Notes (Signed)
Physical Therapy Session Note  Patient Details  Name: Cory [name] MRN: [id number] Date of Birth: [birth year]  Today's Date: 11/08/2014 PT Individual Time: 1500-1600 PT Individual Time Calculation (min): 60 min   Short Term Goals: Week 3:  PT Short Term Goal 1 (Week 3): =LTG's due to ELOS  Skilled Therapeutic Interventions/Progress Updates:   Pt received sitting in w/c in room, agreeable to therapy session.  Discussed D/C plan and him speaking with CSW.  States that he is trying to get in contact with his daughter to see if he can stay with her.  Pt stating he wants to walk during session to make L side stronger.  Had frank discussion with pt regarding goals for home and that he will not be walking with family due to extremely high fall risk.  Pt continued to state that brother may be able to walk with him, however continued to provide max education that he is not safe for gait at this time.  Pt verbalized understanding.  Skilled session then focused on functional tranfers to varying surfaces to increase safety at home, perform proper w/c set up and management of parts.  Pt requires max verbal cues for w/c set up and how to remove leg rest with hand over hand over assist each time.  Performed transfer to higher mat, couch in ADL apt and also to bed in ADL apt.  Performed all at S level again with max verbal cues for w/c set up and parts management.  Performed  Bed mobility at  S to min A level.  Ended session with standing and seated cognitive task with safety cards and identifying safety concerns in pictures and verbalizing how to make them safer.  Pt able to perform with only intermittent min A cues.  Pt self propelled back to room and had him set up to transfer back to bed.  Still requires mod to max cues for set up.  Pt left in bed with bed alarm and 3 bed rails up.  All needs in reach.   Therapy Documentation Precautions:  Precautions Precautions: Fall Precaution Comments: L inattention,  brace on L ankle from recent fx Restrictions Weight Bearing Restrictions: No   Vital Signs: Therapy Vitals Temp: 97.5 F (36.4 C) Temp Source: Oral Pulse Rate: 90 Resp: 18 BP: 112/79 mmHg Patient Position (if appropriate): Sitting Oxygen Therapy SpO2: 99 % O2 Device: Not Delivered Pain: pt with no c/o pain during session, states he had pain earlier in L shin, but   See FIM for current functional status  Therapy/Group: Individual Therapy  [name], Cory [name] 11/08/2014, 5:08 PM

## 2014-11-08 NOTE — Progress Notes (Signed)
[place] PHYSICAL MEDICINE & REHABILITATION     PROGRESS NOTE    Subjective/Complaints: Denies Spasms on Left side but cont with poor awareness   ROS limited due to mental status/affect  Objective: Vital Signs: Blood pressure 101/57, pulse 74, temperature 98.6 F (37 C), temperature source Oral, resp. rate 17, weight 77.4 kg (170 lb 10.2 oz), SpO2 96 %. No results found. No results for input(s): WBC, HGB, HCT, PLT in the last 72 hours.  Recent Labs  11/06/14 0750  CREATININE 0.78   CBG (last 3)  No results for input(s): GLUCAP in the last 72 hours.  Wt Readings from Last 3 Encounters:  10/23/14 77.4 kg (170 lb 10.2 oz)  10/15/14 79.289 kg (174 lb 12.8 oz)  09/03/14 86.183 kg (190 lb)    Physical Exam:  Constitutional: He is oriented to person, place, and time. He appears well-developed and well-nourished.  HENT:  Head: Normocephalic and atraumatic.  Eyes: Conjunctivae are normal. Pupils are equal, round, and reactive to light.   Neck: Normal range of motion. Neck supple.  Cardiovascular: Normal rate and regular rhythm.  Respiratory: Effort normal and breath sounds normal. No respiratory distress. He has no wheezes.  GI: Soft. Bowel sounds are normal.  Musculoskeletal: He exhibits mild pain with Left hip Int/ext rotation Neurological: He is alert and oriented to person, place, and time. Delayed processing.  Left facial weakness with mild dysarthria and left inattention.   Has poor insight and awareness of deficits and easily distracted . Motor strength 0/5 in left deltoid, biceps, triceps, 0/5 to?tr grip, hip flexor, trace knee extensor, 0 ankle dorsiflexion plantar flexion MAS 2 Left hamstrings, MAS 3 in L Hip Add 5/5 on the right side in the same muscle groups.  Absent sensation in the left upper and left lower extremity.  Visual fields absent to confrontation testing on left side  Skin: Skin is warm and dry.    Assessment/Plan: 1. Functional deficits secondary  to right MCA infarct, ?thrombotic, which require 3+ hours per day of interdisciplinary therapy in a comprehensive inpatient rehab setting. Physiatrist is providing close team supervision and 24 hour management of active medical problems listed below. Physiatrist and rehab team continue to assess barriers to discharge/monitor patient progress toward functional and medical goals. Team conference today please see physician documentation under team conference tab, met with team face-to-face to discuss problems,progress, and goals. Formulized individual treatment plan based on medical history, underlying problem and comorbidities.FIM: FIM - Bathing Bathing Steps Patient Completed: Chest, Left Arm, Abdomen, Front perineal area, Buttocks, Right upper leg, Left upper leg Bathing: 4: Min-Patient completes 8-9 43f 10 parts or 75+ percent  FIM - Upper Body Dressing/Undressing Upper body dressing/undressing steps patient completed: Thread/unthread right sleeve of pullover shirt/dresss, Pull shirt over trunk, Put head through opening of pull over shirt/dress Upper body dressing/undressing: 4: Min-Patient completed 75 plus % of tasks FIM - Lower Body Dressing/Undressing Lower body dressing/undressing steps patient completed: Thread/unthread right underwear leg, Thread/unthread left underwear leg, Thread/unthread right pants leg, Thread/unthread left pants leg, Don/Doff right sock, Don/Doff right shoe Lower body dressing/undressing: 3: Mod-Patient completed 50-74% of tasks  FIM - Toileting Toileting: 1: Total-Patient completed zero steps, helper did all 3 (per Lelon Mast, NT)  FIM - Toilet Transfers Toilet Transfers: 4-To toilet/BSC: Min A (steadying Pt. > 75%), 4-From toilet/BSC: Min A (steadying Pt. > 75%) (per Lelon Mast, NT)  FIM - Bed/Chair Transfer Bed/Chair Transfer Assistive Devices: Arm rests, HOB elevated Bed/Chair Transfer: 5: Chair or  W/C > Bed: Supervision (verbal cues/safety issues), 5:  Bed > Chair or W/C: Supervision (verbal cues/safety issues)  FIM - Locomotion: Wheelchair Distance: 150 Locomotion: Wheelchair: 5: Travels 150 ft or more: maneuvers on rugs and over door sills with supervision, cueing or coaxing FIM - Locomotion: Ambulation Locomotion: Ambulation Assistive Devices: Orthosis, Environmental consultant - Hemi Ambulation/Gait Assistance: 3: Mod assist, 2: Max assist Locomotion: Ambulation: 1: Travels less than 50 ft with maximal assistance (Pt: 25 - 49%)  Comprehension Comprehension Mode: Auditory Comprehension: 4-Understands basic 75 - 89% of the time/requires cueing 10 - 24% of the time  Expression Expression Mode: Verbal Expression Assistive Devices: 6-Talk trach valve Expression: 5-Expresses basic 90% of the time/requires cueing < 10% of the time.  Social Interaction Social Interaction Mode: Asleep Social Interaction: 4-Interacts appropriately 75 - 89% of the time - Needs redirection for appropriate language or to initiate interaction.  Problem Solving Problem Solving Mode: Not assessed Problem Solving: 3-Solves basic 50 - 74% of the time/requires cueing 25 - 49% of the time  Memory Memory Mode: Asleep Memory Assistive Devices: Memory book Memory: 3-Recognizes or recalls 50 - 74% of the time/requires cueing 25 - 49% of the time  Medical Problem List and Plan: 1. Functional deficits secondary to R-MCA infarct question embolic v/s thrombotic with resultant left hemiplegia with sensory deficits, left facial weakness, left neglect and dysphagia. 2. DVT Prophylaxis/Anticoagulation: Pharmaceutical: Lovenox 3. Pain Management: tylenol for h/a for now.  topamax for post stroke headache, improved will wean 4. Mood: Seems withdrawn--will monitor for now. LCSW to follow for support and evaluation.  5. Neuropsych: This patient is not capable of making decisions on his own behalf. 6. Skin/Wound Care: Routine pressure relief measures.  7. Fluids/Electrolytes/Nutrition:  Monitor I/O. Follow up admission labs tomorrow 8. HTN: 9. H/o Alcohol abuse/Tobacco use: May d/c CIWA protocol. No signs of WD for several weeks 10. Dyslipidemia: 11.  Mild HyperK, ? Hemolysis , no obvious medication source, will recheck in am  12.  Hx subacute ankle fx (8-9 wks ago) recheck xrays, healing oblique fibular fx , non displaced, will need either ankle splint or AFO when OOB, f/u ortho (Dr Lorin Mercy) as outpt 13.  Spasticity increasing primarily L hamstring, start zanaflex, monitor BP,  Botulinum to Hamstringshelpful , also develping Adductor spasticity     LOS (Days) 21 A FACE TO FACE EVALUATION WAS PERFORMED  KIRSTEINS,ANDREW E 11/08/2014 7:09 AM

## 2014-11-08 NOTE — Progress Notes (Signed)
Social Work Patient ID: Cory [name], male   DOB: [birth year], 53 y.o.   MRN: [id number] Met with management who have expressed due to pt's past he is not able to go to a NH, no NH will take him, due to he is too much of a risk. Option is to go to family's home or to the street.  Have met with pt and he is aware of the change in plan and will contact per pt's  request daughter and his sister To discuss this and to come up with a plan for discharge.  Aware ready 12/23 or 12/24 and will need to do family education prior to discharge.  Spoke with daughter-Cory [name] To inform of this and she will talk with other family members, she did not want worker to contact her aunt she will talk with her herself.  She wanted to know how he would get follow up care,  Have offered where ever he goes to check to see any home health agency in that area  does charity care, since pt's Medicaid and Disability is pending. She is suppose to call worker back Monday  Regarding the plan.  If she does not this worker will contact her.  Speech therapy to do another MBS on Monday.  According to team pt is plateauing and is ready for discharge after family education. Will work toward a plan.

## 2014-11-08 NOTE — Progress Notes (Signed)
Speech Language Pathology Weekly Progress and Session Note  Patient Details  Name: Cory [name] MRN: [id number] Date of Birth: June 09, 1961  Beginning of progress report period: November 01, 2014 End of progress report period: November 08, 2014  Today's Date: 11/08/2014 SLP Individual Time: 4098-1191 SLP Individual Time Calculation (min): 60 min  Short Term Goals: Week 3: SLP Short Term Goal 1 (Week 3): Patient will recall and utilize recommended safe swallow strategies with Dys. 1 textures and nectar-thick liquids with Min vebral cues to minimize overt s/s of aspiration.  SLP Short Term Goal 1 - Progress (Week 3): Met SLP Short Term Goal 2 (Week 3): Patient will demonstrate effecient mastication of Dys. 2 textures with Mod verbal cues for 3 consecutive sessions to demonstrate readiness for texture advancement. SLP Short Term Goal 2 - Progress (Week 3): Met SLP Short Term Goal 3 (Week 3): Patient will attend to left of environment with Mod question cues. SLP Short Term Goal 3 - Progress (Week 3): Met SLP Short Term Goal 4 (Week 3): Patient will demonstrate sustained attention to a basic task for 10 minutes with Min verbal cues for redirection. SLP Short Term Goal 4 - Progress (Week 3): Met SLP Short Term Goal 5 (Week 3): Patient will label 2 physical and 2 cognitive deficits that have resulted from his CVA with Mod question cues.  SLP Short Term Goal 5 - Progress (Week 3): Met SLP Short Term Goal 6 (Week 3): Patient will conume trials of thin liquids with no overt s/s of aspiration over 3 consecutive sessions to demonstrate readiness for a repeat objective swallow assessment. SLP Short Term Goal 6 - Progress (Week 3): Met    New Short Term Goals: Week 4: SLP Short Term Goal 1 (Week 4): Patient will recall and utilize recommended safe swallow strategies with Dys. 2 textures and nectar-thick liquids with Min vebral cues to utilize safe swallow strategies to minimize overt s/s of  aspiration.  SLP Short Term Goal 2 (Week 4): Patient will attend to left of environment during functional tasks with Min question cues. SLP Short Term Goal 3 (Week 4): Patient will demonstrate sustained attention to a basic task for 15 minutes with Min verbal cues for redirection. 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  SLP Short Term Goal 5 (Week 4): Patient will request help as needed duirng functional tasks with Mod question cues.    Weekly Progress Updates: Patient met 6 out of 6 short term goals this reporting period due to functional improvements in overall basic cognitive abilities and swallow function. Patient has made gains in intellectual awareness, left attention, basic problem solving and mastication of Dys. 2 textures.  Given that patient continues to require Mod multimodal cues to safely complete basic self-care tasks patient continues to require skilled SLP services to address these deficits to maximize functional independence and reduce overall burden of care.   Intensity: Minumum of 1-2 x/day, 30 to 90 minutes Frequency: 5 out of 7 days Duration/Length of Stay: SNF placement Treatment/Interventions: Cognitive remediation/compensation;Cueing hierarchy;Dysphagia/aspiration precaution training;Functional tasks;Environmental controls;Internal/external aids;Medication managment;Patient/family education;Speech/Language facilitation;Therapeutic Activities  Daily Session Skilled Therapeutic Interventions:  Skilled treatment session focused on addressing dysphagia and cognition goals. Patient required Mod instructional cues to initiate and identify solutions for basic problems that occurred during meal tray set-up.  SLP also facilitated session with Mod question cues to recall and utilize safe swallow strategies  during consumption of Dys.2 textures and nectar-thick liquids via cup.  Patient  demonstrated prolonged mastication; however, cues to attend to and manage left pocketing were effective at preventing any overt s/s of aspiration. Upon review of notes from previous sessions for weekly progress update patient has demonstrated readiness for repeat objective swallow assessment.  Plan to schedule objective swallow assessment for next visit.        FIM:  Comprehension Comprehension Mode: Auditory Comprehension: 4-Understands basic 75 - 89% of the time/requires cueing 10 - 24% of the time Expression Expression Mode: Verbal Expression: 5-Expresses basic needs/ideas: With no assist Social Interaction Social Interaction: 4-Interacts appropriately 75 - 89% of the time - Needs redirection for appropriate language or to initiate interaction. Problem Solving Problem Solving: 3-Solves basic 50 - 74% of the time/requires cueing 25 - 49% of the time Memory Memory: 3-Recognizes or recalls 50 - 74% of the time/requires cueing 25 - 49% of the time FIM - Eating Eating Activity: 5: Set-up assist for open containers;5: Needs verbal cues/supervision;4: Helper checks for pocketed food General    Pain Pain Assessment Pain Assessment: No/denies pain  Therapy/Group: Individual Therapy  Carmelia Roller., CCC-SLP [phone]  Sharon 11/08/2014, 12:09 PM

## 2014-11-08 NOTE — Progress Notes (Signed)
Social Work Patient ID: Cory SleightStephen [name], male   DOB: 1961-08-11, 53 y.o.   MRN: [id number] [facility] and [facility] are [facility], have faxed medical information she requested.  Await her return call regarding  Questions or ability to offer pt a bed.

## 2014-11-09 ENCOUNTER — Inpatient Hospital Stay (HOSPITAL_COMMUNITY): Payer: Medicaid Other | Admitting: *Deleted

## 2014-11-09 NOTE — Progress Notes (Signed)
Physical Therapy Session Note  Patient Details  Name: Cory [name] MRN: [id number] Date of Birth: 12-07-1960  Today's Date: 11/09/2014 PT Individual Time: 1430-1500 PT Individual Time Calculation (min): 30 min    Skilled Therapeutic Interventions/Progress Updates:  Patient in bed at the beginning of the session, agrees to participate and has no comp[lains of pain./ Supine to sit with min A and max cues to hook L LE to transfer off the bed. Transfer to [name] with min A. [name] propulsion training with min cues to avoid obstacles due to neglect. Training in car transfer with SBA and max verbal cues and increased time due to poor sequencing and safety. Patient returned to room educated he needs assistance with all transfers and mobility , no self ambulation at this time. Patient stated understanding. All needs within reach at the end of the session, alarm on. Therapy Documentation Precautions:  Precautions Precautions: Fall Precaution Comments: L inattention, brace on L ankle from recent fx Restrictions Weight Bearing Restrictions: No Vital Signs: Therapy Vitals Temp: 98.5 F (36.9 C) Temp Source: Oral Pulse Rate: 68 Resp: 17 BP: 100/68 mmHg Patient Position (if appropriate): Lying Oxygen Therapy SpO2: 100 % O2 Device: Not Delivered  See FIM for current functional status  Therapy/Group: Individual Therapy  Dorna MaiCzajkowska, Cory [name] 11/09/2014, 3:06 PM

## 2014-11-09 NOTE — Progress Notes (Signed)
  [place] [facility] PHYSICAL MEDICINE & REHABILITATION     PROGRESS NOTE    Subjective/Complaints: Patient denies any complaints. He is eating with assistance. He does point out his left arm to me as I palpate a pulse on his right arm.  Objective: Vital Signs: Blood pressure 108/76, pulse 72, temperature 97.6 F (36.4 C), temperature source Oral, resp. rate 18, weight 170 lb 10.2 oz (77.4 kg), SpO2 99 %.    Physical Exam:  No acute distress. Chest clear to auscultation. Cardiac exam S1 and S2 are regular. Abdominal exam bowel sounds, soft. Extremities no edema.  Assessment/Plan: 1. Functional deficits secondary to right MCA infarct, ?thrombotic,   Medical Problem List and Plan: 1. Functional deficits secondary to R-MCA infarct question embolic v/s thrombotic with resultant left hemiplegia with sensory deficits, left facial weakness, left neglect and dysphagia. 2. DVT Prophylaxis/Anticoagulation: Pharmaceutical: Lovenox 3. Pain Management: Patient denies headache at this time.  4. Mood: Seems withdrawn--will monitor for now. LCSW to follow for support and evaluation.  5. Neuropsych: This patient is not capable of making decisions on his own behalf. 6. Skin/Wound Care: Routine pressure relief measures.  7. Fluids/Electrolytes/Nutrition: Monitor I/O. Follow up admission labs tomorrow 8. HTN:108/76-112/79 9. H/o Alcohol abuse/Tobacco use:  10. Dyslipidemia: 11.  Mild HyperK, resolved  Lab Results  Component Value Date   K 4.2 11/08/2014   12.  Hx subacute ankle fx (8-9 wks ago) recheck xrays, healing oblique fibular fx , non displaced, will need either ankle splint or AFO when OOB, f/u ortho (Dr Ophelia CharterYates) as outpt 13.  Spasticity increasing primarily L hamstring, start zanaflex, monitor BP,    A FACE TO FACE EVALUATION WAS PERFORMED  [facility],Arless Vineyard Cory [name] 11/09/2014 9:31 AM

## 2014-11-10 ENCOUNTER — Inpatient Hospital Stay (HOSPITAL_COMMUNITY): Payer: Medicaid Other | Admitting: *Deleted

## 2014-11-10 NOTE — Progress Notes (Signed)
  [facility] PHYSICAL MEDICINE & REHABILITATION     PROGRESS NOTE    Subjective/Complaints: Patient sleeping. He wakes up easily. He denies complaints.  Objective: Vital Signs: Blood pressure 107/66, pulse 58, temperature 98.3 F (36.8 C), temperature source Oral, resp. rate 17, weight 170 lb 10.2 oz (77.4 kg), SpO2 99 %.    Physical Exam:  53 year old male who appears older than his stated age. Chest is clear to auscultation. Cardiac exam S1 and S2 are regular. Neck is supple. Extremities without edema. He has a dense left hemiparesis. His speech is clear.  Assessment/Plan: 1. Functional deficits secondary to right MCA infarct, ?thrombotic,   Medical Problem List and Plan: 1. Functional deficits secondary to R-MCA infarct question embolic v/s thrombotic with resultant left hemiplegia with sensory deficits, left facial weakness, left neglect and dysphagia. 2. DVT Prophylaxis/Anticoagulation: Pharmaceutical: Lovenox 3. Pain Management: Patient denies headache at this time.  4. Mood: Seems to have a flat affect.  5. Neuropsych: This patient is not capable of making decisions on his own behalf. 6. Skin/Wound Care: Routine pressure relief measures.  7. Fluids/Electrolytes/Nutrition: Monitor I/O. Follow up admission labs tomorrow 8. HTN: Blood pressure ranges 100-107/66-68. Currently not on any antihypertensive medications. Pulse ranges in the 58-72 range. 9. H/o Alcohol abuse/Tobacco use:  10. Dyslipidemia: 11.  Mild HyperK, resolved  Lab Results  Component Value Date   K 4.2 11/08/2014   12.  Hx subacute ankle fx (8-9 wks ago) recheck xrays, healing oblique fibular fx , non displaced, will need either ankle splint or AFO when OOB, f/u ortho (Dr Cory [name]) as outpt 13.  Spasticity increasing primarily L hamstring, start zanaflex, monitor BP,    A FACE TO FACE EVALUATION WAS PERFORMED  [name] [name] 11/10/2014 8:57 AM

## 2014-11-10 NOTE — Progress Notes (Signed)
Physical Therapy Session Note  Patient Details  Name: Cory SleightStephen [name] MRN: [id number] Date of Birth: [birth year]  Today's Date: 11/10/2014 PT Individual Time: 1120-1205 PT Individual Time Calculation (min): 45 min     Skilled Therapeutic Interventions/Progress Updates:  Patient in bed at the beginning of the session , no pain reported, agrees to session, min A to sit EOB , sitting with min A due to posterior balance loss,transfer to w/c with max VC for sequencing and min A due to poor following of instructions. Session focused on functional transfer training in the rehab apartment, transfers to bed , couch and chair all with SBA to min A, due to patient poor following instruction, decrease safety and poor organization skills. Extensive education on safety and sequencing,patient acknowledges but does not demonstrate back. Sit to stand and standing in front of kitchen sink with manual facilitation of WB through R LE and knee locking 2 x 3 minutes. W/C mobility training on a distance of 300 feet with Hemi technique-SBA. Patient returned to room , left in bed with alarm on and all needs within reach.   Therapy Documentation Precautions:  Precautions Precautions: Fall Precaution Comments: L inattention, brace on L ankle from recent fx Restrictions Weight Bearing Restrictions: No  See FIM for current functional status  Therapy/Group: Individual Therapy  Dorna MaiCzajkowska, Johnnie Goynes W 11/10/2014, 12:42 PM

## 2014-11-11 ENCOUNTER — Inpatient Hospital Stay (HOSPITAL_COMMUNITY): Payer: Medicaid Other | Admitting: Speech Pathology

## 2014-11-11 ENCOUNTER — Inpatient Hospital Stay (HOSPITAL_COMMUNITY): Payer: Self-pay | Admitting: Rehabilitation

## 2014-11-11 ENCOUNTER — Inpatient Hospital Stay (HOSPITAL_COMMUNITY): Payer: Medicaid Other

## 2014-11-11 NOTE — Procedures (Signed)
Objective Swallowing Evaluation: Modified Barium Swallowing Study  Patient Details  Name: Cory SleightStephen Lemen MRN: [id number] Date of Birth: 1961-06-01  Today's Date: 11/11/2014 Time:  1300- 1330   30 minutes   Past Medical History:  Past Medical History  Diagnosis Date  . Medical history non-contributory    Past Surgical History:  Past Surgical History  Procedure Laterality Date  . No past surgeries    . Tee without cardioversion N/A 10/16/2014    Procedure: TRANSESOPHAGEAL ECHOCARDIOGRAM (TEE);  Surgeon: Donato SchultzMark Skains, MD;  [facility];  Service: Cardiovascular;  Laterality: N/A;  . Loop recorder implant N/A 10/18/2014    Procedure: LOOP RECORDER IMPLANT;  Surgeon: Duke SalviaSteven C Klein, MD;  [facility];  Service: Cardiovascular;  Laterality: N/A;   HPI:  53 y.o. male with history of alcoholism was brought to the ER with left facial and left-sided hemiplegia. CT head showed features consistent for CVA on the right MCA territory. CXR Suboptimal inspiration. No acute cardiopulmonary disease.Marland Kitchen.  Poor performance on bedside swallow eval with recs for MBS. MBS completed and diet recommendation was for dys2/nectar due to incr asp risk with thin.  Patient admitted to rehab and has been participating in therapies; patient with functional improvements at bedside as a result, repeat objective assessment warratned at this time.     Recommendation/Prognosis  Clinical Impression:   Dysphagia Diagnosis: Moderate oral phase dysphagia;Mild pharyngeal phase dysphagia Clinical impression: Patient presents with functional improvements in swallow function as compared to previous swallow assessment.  Patient continues to demonstrate a primarily based oral dysphagia marked by significant deficits in sensation and awareness of textures and liquids in left oral cavity resulting in left anterior loss of boluses and buccal pocketing as well as inability to transit pill with thin liquids.   Despite premature loss of all POs over base of tongue, patient is now demonstrating improved airway protection resulting in no observed instances of penetration or aspiration of thin liquids.  Impulsivity has also improved but patient continues to require Mod cues for safety.  Recommend initiating a dysphagia 3 diet with thin liquids and continued full supervision given ongoing cognitive deficits.    SLP educate patient regarding diet advancements as well as continued restrictions via teach back, which he required Mod question cues to restate following a 2-3 minute delay.      Swallow Evaluation Recommendations:  Diet Recommendations: Dysphagia 3 (Mechanical Soft);Thin liquid Liquid Administration via: Cup;Straw Medication Administration: Whole meds with puree Supervision: Patient able to self feed;Full supervision/cueing for compensatory strategies Compensations: Small sips/bites;Slow rate;Check for pocketing;Check for anterior loss Postural Changes and/or Swallow Maneuvers: Seated upright 90 degrees Oral Care Recommendations: Oral care BID Follow up Recommendations: 24 hour supervision/assistance;Home health SLP    Prognosis:  Prognosis for Safe Diet Advancement: Good Barriers to Reach Goals: Cognitive deficits   Individuals Consulted: Consulted and Agree with Results and Recommendations: Patient      SLP Assessment/Plan  Plan:  See care plan of details    Short Term Goals: Week 4: SLP Short Term Goal 1 (Week 4): Patient will recall and utilize recommended safe swallow strategies with Dys. 2 textures and nectar-thick liquids with Min vebral cues to utilize safe swallow strategies to minimize overt s/s of aspiration.  SLP Short Term Goal 2 (Week 4): Patient will attend to left of environment during functional tasks with Min question cues. SLP Short Term Goal 3 (Week 4): Patient will demonstrate sustained attention to a basic task for 15 minutes with Min  verbal cues for  redirection. 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  SLP Short Term Goal 5 (Week 4): Patient will request help as needed duirng functional tasks with Mod question cues.      General: Date of Onset: 10/15/14 Type of Study: Modified Barium Swallowing Study Reason for Referral: Objectively evaluate swallowing function Diet Prior to this Study: Dysphagia 2 (chopped);Nectar-thick liquids Temperature Spikes Noted: No Respiratory Status: Room air History of Recent Intubation: No Behavior/Cognition: Cooperative;Pleasant mood;Lethargic;Impulsive;Requires cueing Oral Cavity - Dentition: Missing dentition Oral Motor / Sensory Function: Impaired - see Bedside swallow eval Self-Feeding Abilities: Able to feed self;Needs assist;Needs set up Patient Positioning: Upright in chair Baseline Vocal Quality: Clear Volitional Cough: Strong Volitional Swallow: Able to elicit Anatomy: Within functional limits Pharyngeal Secretions: Not observed secondary MBS   Reason for Referral:   Objectively evaluate swallowing function    Oral Phase: Oral Preparation/Oral Phase Oral Phase: Impaired Oral - Solids Oral - Puree: Not tested Oral - Mechanical Soft: Left anterior bolus loss;Impaired mastication;Weak lingual manipulation;Left pocketing in lateral sulci Oral - Regular: Left anterior bolus loss;Impaired mastication;Weak lingual manipulation;Left pocketing in lateral sulci;Pocketing in anterior sulcus;Lingual/palatal residue Oral - Pill: Other (Comment) (unable to orally transit)   Pharyngeal Phase:  Pharyngeal Phase Pharyngeal Phase: Impaired Pharyngeal - Nectar Pharyngeal - Nectar Cup: Premature spillage to pyriform sinuses Penetration/Aspiration details (nectar cup): Material does not enter airway Pharyngeal - Thin Pharyngeal - Thin Cup: Premature spillage to pyriform sinuses Penetration/Aspiration  details (thin cup): Material does not enter airway Pharyngeal - Thin Straw: Premature spillage to pyriform sinuses Penetration/Aspiration details (thin straw): Material does not enter airway Pharyngeal - Solids Pharyngeal - Puree: Not tested Pharyngeal - Mechanical Soft: Premature spillage to valleculae Pharyngeal - Regular: Premature spillage to valleculae Pharyngeal - Pill: Other (Comment) (unable to observe due to inability to orally transit )   Cervical Esophageal Phase  Cervical Esophageal Phase [facility]ophageal [facility]: [facility]   GN        Fae PippinMelissa Kerry Odonohue, M.A., CCC-SLP [phone]  Aralynn Brake 11/11/2014, 2:01 PM

## 2014-11-11 NOTE — Progress Notes (Signed)
Social Work Patient ID: Cory [name], male   DOB: 1961-04-29, 53 y.o.   MRN: [id number] Met with pt to discuss his plans he has not spoken with daughter yet, but wanted worker to contact his sister-Jean to discuss plans. Have left voice mails for daughter and sister regarding pt's readiness for discharge on 12/24 even if they still don't have a discharge plan. Pt to contact his girlfriend-Lynette and see if she can help him, he was living with her prior to admission. Await return call from daughter or sister.

## 2014-11-11 NOTE — Progress Notes (Signed)
Physical Therapy Session Note  Patient Details  Name: Cory SleightStephen [name] MRN: [id number] Date of Birth: 10-28-1961  Today's Date: 11/11/2014 PT Individual Time: 0930-1030 PT Individual Time Calculation (min): 60 min   Short Term Goals: Week 3:  PT Short Term Goal 1 (Week 3): =LTG's due to ELOS  Skilled Therapeutic Interventions/Progress Updates:   Pt received lying in bed, agreeable to therapy session.  Performed bed mobility with HOB flat and without rails at S level.  Continued cues for attention to LLE.  Once at EOB, assisted in donning pants with mod verbal cues for assisting and for upright posture as he tends to lose his balance posteriorly.  Stood at min A level to pull up pants with cues for increased L knee extension and glute activation and intermittent LOB to the L.  Transferred to w/c at S level.   Min cues for placement of LLE during transfer.  Session continues to focus on functional transfers w/c<>car, w/c<>bed with focus on w/c set up and parts management as well as safety during transfer.  Continues to require mod cues for close w/c set up, removal of parts, and also for safety of LLE management during transfer each time.  Discussed that family would have to come in for family training as soon as possible once he decides where is going.  Also discussed that D/C date would likely be Thursday.  Pt verbalized understanding, but demonstrates decreased awareness of situation.  Then performed standing task placing horse shoes to the R for trunk rotation and increased activation in LLE.  Continue to note decreased attention, despite having mirror for increased visual feedback for posture.  Continues to lose balance to the L with zero awareness to correct and then attempts to sit when therapist not ready.  Continue to have discussions regarding safety and the need for 24/7 S at home.  Ended session with gait x 25' x 1 with hemi walker (without AFO, with L ankle brace) at max A level.  Continues  to require step by step instructional cues for stepping sequence, upright posture and waiting for therapist to secure LLE.  Pt left in w/c and assisted back to room with quick release belt donned, half lap tray donned and all needs in reach.    Therapy Documentation Precautions:  Precautions Precautions: Fall Precaution Comments: L inattention, brace on L ankle from recent fx Restrictions Weight Bearing Restrictions: No  Pain: Pt with min c/o pain in L ankle with standing, allowed rest breaks to decrease pain.    Locomotion : Ambulation Ambulation/Gait Assistance: 2: Max Lawyerassist Wheelchair Mobility Distance: 150   See FIM for current functional status  Therapy/Group: Individual Therapy  [name], Sherol Sabas Ann 11/11/2014, 11:53 AM

## 2014-11-11 NOTE — Progress Notes (Signed)
[facility] [place] PHYSICAL MEDICINE & REHABILITATION     PROGRESS NOTE    Subjective/Complaints: No c/os, sleepy but awakens to voice   ROS limited due to mental status/affect  Objective: Vital Signs: Blood pressure 126/68, pulse 57, temperature 98.6 F (37 C), temperature source Oral, resp. rate 20, weight 77.4 kg (170 lb 10.2 oz), SpO2 100 %. No results found. No results for input(s): WBC, HGB, HCT, PLT in the last 72 hours. No results for input(s): NA, K, CL, GLUCOSE, BUN, CREATININE, CALCIUM in the last 72 hours.  Invalid input(s): CO CBG (last 3)  No results for input(s): GLUCAP in the last 72 hours.  Wt Readings from Last 3 Encounters:  10/23/14 77.4 kg (170 lb 10.2 oz)  10/15/14 79.289 kg (174 lb 12.8 oz)  09/03/14 86.183 kg (190 lb)    Physical Exam:  Constitutional: He is oriented to person, place, and time. He appears well-developed and well-nourished.  HENT:  Head: Normocephalic and atraumatic.  Eyes: Conjunctivae are normal. Pupils are equal, round, and reactive to light.   Neck: Normal range of motion. Neck supple.  Cardiovascular: Normal rate and regular rhythm.  Respiratory: Effort normal and breath sounds normal. No respiratory distress. He has no wheezes.  GI: Soft. Bowel sounds are normal.  Musculoskeletal: He exhibits mild pain with Left hip Int/ext rotation Neurological: He is alert and oriented to person, place, and time. Delayed processing.  Left facial weakness with mild dysarthria and left inattention.   Has poor insight and awareness of deficits and easily distracted . Motor strength 0/5 in left deltoid, biceps, triceps, 0/5 to?tr grip, hip flexor, trace knee extensor, 0 ankle dorsiflexion plantar flexion MAS 2 Left hamstrings, MAS 3 in L Hip Add 5/5 on the right side in the same muscle groups.  Absent sensation in the left upper and left lower extremity.  Visual fields absent to confrontation testing on left side  Skin: Skin is warm and dry.     Assessment/Plan: 1. Functional deficits secondary to right MCA infarct, ?thrombotic, which require 3+ hours per day of interdisciplinary therapy in a comprehensive inpatient rehab setting. Physiatrist is providing close team supervision and 24 hour management of active medical problems listed below. Physiatrist and rehab team continue to assess barriers to discharge/monitor patient progress toward functional and medical goals. Marland Kitchen.FIM: FIM - Bathing Bathing Steps Patient Completed: Chest, Left Arm, Abdomen, Front perineal area, Buttocks, Right upper leg, Left upper leg Bathing: 4: Min-Patient completes 8-9 3114f 10 parts or 75+ percent  FIM - Upper Body Dressing/Undressing Upper body dressing/undressing steps patient completed: Thread/unthread right sleeve of pullover shirt/dresss, Put head through opening of pull over shirt/dress, Pull shirt over trunk Upper body dressing/undressing: 4: Min-Patient completed 75 plus % of tasks FIM - Lower Body Dressing/Undressing Lower body dressing/undressing steps patient completed: Thread/unthread right underwear leg, Thread/unthread right pants leg, Thread/unthread left pants leg, Don/Doff right sock, Don/Doff right shoe Lower body dressing/undressing: 3: Mod-Patient completed 50-74% of tasks  FIM - Toileting Toileting: 1: Total-Patient completed zero steps, helper did all 3 (per Lelon MastNicole Carter, NT)  FIM - Toilet Transfers Toilet Transfers: 4-To toilet/BSC: Min A (steadying Pt. > 75%), 4-From toilet/BSC: Min A (steadying Pt. > 75%) (per Lelon MastNicole Carter, NT)  FIM - Bed/Chair Transfer Bed/Chair Transfer Assistive Devices: Arm rests, HOB elevated Bed/Chair Transfer: 5: Bed > Chair or W/C: Supervision (verbal cues/safety issues), 5: Chair or W/C > Bed: Supervision (verbal cues/safety issues)  FIM - Locomotion: Wheelchair Distance: 150 Locomotion: Wheelchair: 5: Travels 150  ft or more: maneuvers on rugs and over door sills with supervision, cueing or  coaxing FIM - Locomotion: Ambulation Locomotion: Ambulation Assistive Devices: Orthosis, Walker - Hemi Ambulation/Gait Assistance: 3: Mod assist, 2: Max assist Locomotion: Ambulation: 1: Travels less than 50 ft with maximal assistance (Pt: 25 - 49%)  Comprehension Comprehension Mode: Auditory Comprehension: 4-Understands basic 75 - 89% of the time/requires cueing 10 - 24% of the time  Expression Expression Mode: Verbal Expression Assistive Devices: 6-Talk trach valve Expression: 5-Expresses basic 90% of the time/requires cueing < 10% of the time.  Social Interaction Social Interaction Mode: Asleep Social Interaction: 3-Interacts appropriately 50 - 74% of the time - May be physically or verbally inappropriate.  Problem Solving Problem Solving Mode: Not assessed Problem Solving: 3-Solves basic 50 - 74% of the time/requires cueing 25 - 49% of the time  Memory Memory Mode: Asleep Memory Assistive Devices: Memory book Memory: 3-Recognizes or recalls 50 - 74% of the time/requires cueing 25 - 49% of the time  Medical Problem List and Plan: 1. Functional deficits secondary to R-MCA infarct question embolic v/s thrombotic with resultant left hemiplegia with sensory deficits, left facial weakness, left neglect and dysphagia. 2. DVT Prophylaxis/Anticoagulation: Pharmaceutical: Lovenox 3. Pain Management: tylenol for h/a for now.  topamax for post stroke headache, improved will wean 4. Mood: Seems withdrawn--will monitor for now. LCSW to follow for support and evaluation.  5. Neuropsych: This patient is not capable of making decisions on his own behalf. 6. Skin/Wound Care: Routine pressure relief measures.  7. Fluids/Electrolytes/Nutrition: Monitor I/O. Follow up admission labs tomorrow 8. HTN: 9. H/o Alcohol abuse/Tobacco use: May d/c CIWA protocol. No signs of WD for several weeks 10. Dyslipidemia: 11.  Mild HyperK, ? Hemolysis , no obvious medication source, will recheck in am   12.  Hx subacute ankle fx (9-10wks wks ago) recheck xrays, healing oblique fibular fx , non displaced, will need either ankle splint or AFO when OOB, f/u ortho (Dr Ophelia CharterYates) as outpt 13.  Spasticity increasing primarily L hamstring, start zanaflex, monitor BP,  Botulinum to Hamstringshelpful , also develping Adductor and quad  spasticity     LOS (Days) 24 A FACE TO FACE EVALUATION WAS PERFORMED  Claudette LawsKIRSTEINS,Salih Williamson E 11/11/2014 7:19 AM

## 2014-11-11 NOTE — Progress Notes (Signed)
Occupational Therapy Session Note  Patient Details  Name: Cory [name] MRN: [id number] Date of Birth: [birth year]  Today's Date: 11/11/2014 OT Individua l Time: 1030-1130 60 min   Short Term Goals: Week 4:  OT Short Term Goal 1 (Week 4): STGs = LTGs secondary to upcoming discharge  Skilled Therapeutic Interventions/Progress Updates: ADL-retraining with focus on improved carry-over of adapted bathing/dressing skills and w/c mobility using hemi-w/c.    Pt received seated in w/c and receptive for planned activity: bathing/dressing, transfers, and dynamic standing balance.   Pt unable to gather supplies d/t inability to manage w/c, I'ly.   After setup assist, pt completed transfer from w/c to tub bench and bathed sitting/standing with min assist to steady while pt stood to wash his buttocks and manual facilitation to cross left leg over right.    Pt demo'd excellent thoroughness with bathing although requiring intermittent cues to progress to attend to left.    Pt dressed seated in w/c at sink, standing supported to allow assist with pulling up underwear and pants.   Pt donned right sock and shoe unassisted but requires assist to manage LLE and apply ankle orthosis.   After dressing, pt was educated on use of hemi w/c and completed instructional session on managing chair, turning left and right and moving w/c backwards.   Pt left in chair at end of session, QRB attached with half-lap tray installed.   Pt could reach brakes levers on both sides, independently.     Therapy Documentation Precautions:  Precautions Precautions: Fall Precaution Comments: L inattention, brace on L ankle from recent fx Restrictions Weight Bearing Restrictions: No  Vital Signs: Therapy Vitals Temp: 98.6 F (37 C) Temp Source: Oral Pulse Rate: 81 Resp: 18 BP: 117/78 mmHg Patient Position (if appropriate): Lying Oxygen Therapy SpO2: 100 % O2 Device: Not Delivered  Pain: Pain Assessment Pain Assessment:  No/denies pain  See FIM for current functional status  Therapy/Group: Individual Therapy  Agustina Witzke 11/11/2014, 3:12 PM

## 2014-11-11 NOTE — Progress Notes (Signed)
Physical Therapy Session Note  Patient Details  Name: Cory [name] MRN: [id number] Date of Birth: 1961/08/22  Today's Date: 11/11/2014 PT Individual Time: [id number] PT Individual Time Calculation (min): 30 min   Short Term Goals: Week 3:  PT Short Term Goal 1 (Week 3): =LTG's due to ELOS  Skilled Therapeutic Interventions/Progress Updates:   Pt received lying in bed, agreeable to therapy session. Performed bed mobility at S level.  Had pt attempt to don shoes.  He was able to don R shoe on his own with mod cues for not scooting R hip any closer to EOB, however requires max verbal cues and assist to cross LLE over R LE to attempt to don shoe.  He was still unable to perform without assist from therapist.  Pt transferred to w/c at S level.  Upon transferring to w/c, noted that chuck pad and fitted sheets were saturated with urine.  Pt states that he could not tell he was wet, and then states that he had called for help.  Confirmed with RN that pt was offered help, but kept stating "I'm good, I'm good."  Assisted pt into restroom and transferred to toilet at min/guard level with max verbal cues for w/c set up and safety.  Had pt stand several times at min/mod A level in order to assist with peri cleansing and doffing/donning old/new clothing.  He was able to perform peri care, however requires assist for clothing management on L side.  Provided total A for donning new underwear and pants.  Assisted pt over to sink in order to stand and wash/dry hands.  Demonstrates perseverative behaviors with dispensing soap and requires max verbal cues to initiate tasks and complete task.  Ended session with two reps of sit<>stand without use of RUE to increase L weight shift and WB.  Requires mod/max A when assisting in standing in this manner.  Pt left in w/c with quick release belt donned and all needs in reach.   Therapy Documentation Precautions:  Precautions Precautions: Fall Precaution Comments: L  inattention, brace on L ankle from recent fx Restrictions Weight Bearing Restrictions: No   Pain: pt with no c/o pain during session.   See FIM for current functional status  Therapy/Group: Individual Therapy  [name], [name] 11/11/2014, 6:16 PM

## 2014-11-11 NOTE — Progress Notes (Signed)
Social Work Patient ID: Naida SleightStephen Mckendree, male   DOB: 01-20-1961, 53 y.o.   MRN: [id number] Spoke with daughter-Tyisha who reports there is no one to take care of pt and she wants to look at nursing facilities herself to see if she can get a bed for him. Informed her it will be disclosed his history and the fact he is pending Medicaid.  Have faxed her the list of facilities.  She will take up with his probation officer her concerns. She is concerned his rights are being violated by needing to leave the hospital and no nursing home to take him. Will ask the rehab medical director to contact her to address questions or concerns.

## 2014-11-12 ENCOUNTER — Inpatient Hospital Stay (HOSPITAL_COMMUNITY): Payer: Self-pay | Admitting: Occupational Therapy

## 2014-11-12 ENCOUNTER — Inpatient Hospital Stay (HOSPITAL_COMMUNITY): Payer: Self-pay | Admitting: Speech Pathology

## 2014-11-12 ENCOUNTER — Inpatient Hospital Stay (HOSPITAL_COMMUNITY): Payer: Medicaid Other

## 2014-11-12 ENCOUNTER — Inpatient Hospital Stay (HOSPITAL_COMMUNITY): Payer: Self-pay | Admitting: Rehabilitation

## 2014-11-12 NOTE — Progress Notes (Signed)
Occupational Therapy Session Note  Patient Details  Name: Cory [name] MRN: [id number] Date of Birth: 11/19/1961  Today's Date: 11/12/2014 OT Individual Time: 0830-0930 OT Individual Time Calculation (min): 60 min    Short Term Goals: Week 3:  OT Short Term Goal 1 (Week 3): progress towards long term goals based on estimated discharge date  Skilled Therapeutic Interventions/Progress Updates: ADL-retraining with focus on adapted bathing/dressing skills, w/c mobility using hemi w/c, improved attention to left, improved awareness.    Pt received seated in hemi w/c and awaiting therapist for planned activity.   Pt was challenged to collect his clothing using w/c but required re-ed on method of propulsion with hand-over-hand guidance to alternate grip from outer rim to inner rim with hemi w/c.  With extra time, pt then successfully propelled w/c to dresser and collected clothing although also selecting some items from soiled clothing at bottom drawer closet, initially, until corrected by therapist.   Pt was escorted to bathroom for expedience and positioned by grab bars in shower stall to complete transfer with only standby assist.    Pt bathed unassisted seated in shower while therapist provided setup for grooming at sink.   Upon return to pt in shower, therapist noted pt's inattention to his left which pt then washed with additional cues, using crossed leg to wash down to his left foot.   Pt returned to w/c after bathing and dressed and groomed at sink with overall mod assist to don underwear, pants, socks, ankle brace and shoes.   Pt consistently requires cues and prompts to attend to left during session but is able to manage of LLE as needed to reduce burden of care and he can maintain static standing balance while supported at sink or with grab bar to enable assist with thoroughness during bathing and dressing.   Pt left in w/c at end of session with QRB and half-tray attached.     Therapy  Documentation Precautions:  Precautions Precautions: Fall Precaution Comments: L inattention, brace on L ankle from recent fx Restrictions Weight Bearing Restrictions: No  Pain: Pain Assessment Pain Assessment: 0-10 Pain Score: 0-No pain Pain Type: Chronic pain Pain Location: Ankle Pain Orientation: Left Pain Intervention(s): Medication (See eMAR)  See FIM for current functional status  Therapy/Group: Individual Therapy  Itzae Mccurdy 11/12/2014, 10:00 AM

## 2014-11-12 NOTE — Progress Notes (Signed)
Social Work Patient ID: Cory [name], male   DOB: 05/12/1961, 53 y.o.   MRN: [id number] Director of rehab unit contacted both daughter-Tyisha and Lynette-girlfriend to discuss concerns the need to come up with a discharge plan and concerns they have. Awaiting return call from IagoLynette.  Pt's sister-Jean is attempting to look for a NH bed for pt.  Have sent out FL2 throughout the state of [place] to see if can find bed for him. Pt states: " I feel this is what I need to do and get more therapy."  Director of Rehab-Don also spoke with pt regarding the plan and his need to take a role it his discharge plan. Have left a message for jean-pt's sister to see if I can assist her or answer any questions.  Family seems to think pt can stay here until a facility is found to take him.  Continue to work On a discharge plan for pt and work with therapy team, although they are expressing pt is plateauing in therapies.

## 2014-11-12 NOTE — Plan of Care (Signed)
Problem: RH Bed to Chair Transfers Goal: LTG Patient will perform bed/chair transfers w/assist (PT) LTG: Patient will perform bed/chair transfers with assistance, with/without cues (PT).  Upgraded due to progress with transfers, however still requires mod to max A cues for safety and w/c set up.

## 2014-11-12 NOTE — Progress Notes (Signed)
Physical Therapy Session Note  Patient Details  Name: Cory [name] MRN: [id number] Date of Birth: 1961-10-15  Today'[name] Date: 11/12/2014 PT Individual Time: 1400-1430 PT Individual Time Calculation (min): 30 min   Short Term Goals: Week 3:  PT Short Term Goal 1 (Week 3): =LTG'[name] due to ELOS  Skilled Therapeutic Interventions/Progress Updates:  1:1. Pt received semi-reclined in bed, ready for therapy. Focus this session on functional w/c propulsion and transfers. Pt req supervision for t/f sup>sit EOB with HOB elevated, bed rail and increased time. Pt req supervision-min A for w/c propulsion with R UE/LE 175'x1, but supervision with use of R LE only 150'x1 with overall min cues for technique and L awareness. Strong emphasis this session on pt performing w/c set up and completion of squat pivot transfer w/c<>tx mat x2, pt req supervision for transfer with mod cues and significantly increased time. Created checklist of parts to manage on w/c for t/f set up. Pt only req min cues for w/c set up on 3rd attempt with use of checklist. Pt left sitting in w/c at end of session in care of RN, being wheeled back to RN station.   Therapy Documentation Precautions:  Precautions Precautions: Fall Precaution Comments: L inattention, brace on L ankle from recent fx Restrictions Weight Bearing Restrictions: No  See FIM for current functional status  Therapy/Group: Individual Therapy  Cory [name], Cory [name] 11/12/2014, 2:53 PM

## 2014-11-12 NOTE — Progress Notes (Signed)
Speech Language Pathology Daily Session Note  Patient Details  Name: Cory [name] MRN: [id number] Date of Birth: Aug 28, 1961  Today's Date: 11/12/2014 SLP Individual Time: [id number] SLP Individual Time Calculation (min): 30 min  Short Term Goals: Week 4: SLP Short Term Goal 1 (Week 4): Patient will recall and utilize recommended safe swallow strategies with Dys. 2 textures and nectar-thick liquids with Min vebral cues to utilize safe swallow strategies to minimize overt s/s of aspiration.  SLP Short Term Goal 2 (Week 4): Patient will attend to left of environment during functional tasks with Min question cues. SLP Short Term Goal 3 (Week 4): Patient will demonstrate sustained attention to a basic task for 15 minutes with Min verbal cues for redirection. 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  SLP Short Term Goal 5 (Week 4): Patient will request help as needed duirng functional tasks with Mod question cues.    Skilled Therapeutic Interventions:  Pt was seen for skilled ST targeting cognitive goals.  Pt received seated upright in wheelchair at the RN station.  Pt was awake, alert, and agreeable to participate in ST.  SLP facilitated the session with a functional problem solving task involving managing all the steps needed to manage wheelchair in preparation for a transfer.  Pt completed 3 out of 4 of the necessary steps (i.e. Brakes, tray, leg rest, and arm rest) with overall mod assist verbal cues for initiation and sequencing.  Pt exhibited the most difficulty with applying and removing his leg rests and exhibited decreased functional problem solving for attempting alternative methods to be more successful.  Continue per current plan of care.    FIM:  Comprehension Comprehension Mode: Auditory Comprehension: 5-Understands basic 90% of the time/requires cueing < 10% of the  time Expression Expression Mode: Verbal Expression: 5-Expresses basic 90% of the time/requires cueing < 10% of the time. Social Interaction Social Interaction: 4-Interacts appropriately 75 - 89% of the time - Needs redirection for appropriate language or to initiate interaction. Problem Solving Problem Solving: 2-Solves basic 25 - 49% of the time - needs direction more than half the time to initiate, plan or complete simple activities Memory Memory: 2-Recognizes or recalls 25 - 49% of the time/requires cueing 51 - 75% of the time   Pain Pain Assessment Pain Assessment: No/denies pain  Therapy/Group: Individual Therapy  Carletha Dawn, Melanee SpryNicole L 11/12/2014, 4:49 PM

## 2014-11-12 NOTE — Progress Notes (Signed)
Physical Therapy Weekly Progress Note  Patient Details  Name: Cory [name] MRN: [id number] Date of Birth: [birth year]  Beginning of progress report period: November 04, 2014 End of progress report period: November 12, 2014  Today's Date: 11/12/2014 PT Individual Time: 1030-1130 PT Individual Time Calculation (min): 60 min   Patient has met  9 of 10 long term goals.  Short term goals not set due to estimated length of stay.  Pt making very slow progress and continues to require S for transfers with mod to max verbal cues for w/c set up and parts management.  Also continues to require mod to max safety cues during all movement.  Note he continues to avoid LLE WB during standing tasks with zero balance reaction and severe cognitive deficits with poor awareness and attention, also limiting progress with mobility.  Continue to feel he will need higher level of care than family willing to provide.  Very strongly recommend 24/7 S to min A for this pt at D/C.   Patient continues to demonstrate the following deficits: decreased and therefore will continue to benefit from skilled PT intervention to enhance overall performance with activity tolerance, balance, postural control, ability to compensate for deficits, functional use of  left upper extremity and left lower extremity, attention, awareness, coordination and knowledge of precautions.  See Patient's Care Plan for progression toward long term goals.  Patient Have downgraded goals to S for transfers, mod A for standing balance, and max A for gait in therapy only.  Feel that he will not progress any closer to mod I during initial ELOS.  Therefore recommending 24/7 S or DC to SNF. Marland Kitchen  See plan of care for revisions.   Skilled Therapeutic Interventions/Progress Updates:   Pt received sitting in w/c in room, agreeable to therapy session.  Pt self propelled to therapy gym x 150' with R hemi technique at S level.  Continues to require min to mod cues for  avoiding obstacles in the L and to remain on R side of hallway to allow other traffic to pass.  Session continues to focus on blocked practice of transfers to varying surfaces with pt setting up w/c.  He requires VERY lengthy amount of time to attempt self set up of w/c, however still requires cues and at times assist for correct set up.  Max verbal cues to sequence through removal of lap tray (moving to the side), taking L foot from foot rest, locking both brakes, and removal of arm rest.  Performed x 3 reps during session.  Ended session with standing task of playing connect four in order to work on midline orientation, upright posture, balance, memory, new learning, and scanning L environment.  Requires max verbal cues and HOH assist to prevent pt from continuing to place piece, despite directions to wait for therapist to take turn.  Also requires total A cues to scan L environment as therapist won each game because of L inattention.  Requires mod A to remain standing with facilitation for L weight shift, as he continues to keep all weight on RLE.  Transferred back to w/c and assisted back to room.  Left in w/c with quick release belt donned and all needs in reach.   Therapy Documentation Precautions:  Precautions Precautions: Fall Precaution Comments: L inattention, brace on L ankle from recent fx Restrictions Weight Bearing Restrictions: No   Vital Signs: Therapy Vitals Temp: 98.4 F (36.9 C) Temp Source: Oral Pulse Rate: 71 Resp: 20 BP: 119/76 mmHg Oxygen  Therapy SpO2: 100 % O2 Device: Not Delivered Pain: Pain Assessment Pain Assessment: 0-10 Pain Score: 0-No pain Pain Type: Chronic pain Pain Location: Ankle Pain Orientation: Left Pain Intervention(s): Medication (See eMAR)     See FIM for current functional status  Therapy/Group: Individual Therapy  Denice Bors 11/12/2014, 8:27 AM

## 2014-11-13 ENCOUNTER — Inpatient Hospital Stay (HOSPITAL_COMMUNITY): Payer: Medicaid Other | Admitting: Occupational Therapy

## 2014-11-13 ENCOUNTER — Inpatient Hospital Stay (HOSPITAL_COMMUNITY): Payer: Self-pay | Admitting: Rehabilitation

## 2014-11-13 ENCOUNTER — Inpatient Hospital Stay (HOSPITAL_COMMUNITY): Payer: Self-pay | Admitting: Speech Pathology

## 2014-11-13 LAB — CREATININE, SERUM
CREATININE: 0.97 mg/dL (ref 0.50–1.35)
GFR calc Af Amer: >90 mL/min (ref >90)

## 2014-11-13 NOTE — Progress Notes (Signed)
Physical Therapy Note  Patient Details  Name: Naida SleightStephen Nelson MRN: [id number] Date of Birth: 19-Oct-1961 Today's Date: 11/13/2014    Note that pts plan of care is now changing to QD due to pts lack of progress in all aspects of therapy.  This will apply to PT, OT, and SLP.    [name], Aldrick Derrig Ann 11/13/2014, 11:17 AM

## 2014-11-13 NOTE — Progress Notes (Signed)
Physical Therapy Session Note  Patient Details  Name: Cory SleightStephen [name] MRN: [id number] Date of Birth: May 28, 1961  Today's Date: 11/13/2014 PT Individual Time: 0830-0930 PT Individual Time Calculation (min): 60 min   Short Term Goals: Week 3:  PT Short Term Goal 1 (Week 3): =LTG's due to ELOS  Skilled Therapeutic Interventions/Progress Updates:   Pt received sitting in w/c in room, agreeable to therapy session.  Pt self propelled to/from therapy gym using R hemi technique at S level.  Min cues today for remaining on R side of hallway, but better with avoiding obstacles.  Skilled sessions continue to focus on blocked practice of functional transfers to car, furniture, therapy mat during session for increased carryover for w/c set up, removal of parts and LLE placement during transfer.  Performed all transfers at S level, but again mod to max verbal cues for step by step sequence and to use written list of steps.  Then performed seated nustep x 5 mins with LEs only (theraband to assist keeping LLE in place) for NMR through LLE.  Pt able to initiate LLE extension, however at times requires [facility] assist.  Ended session with sit<>stand without use of RUE and with facilitation for increased weight shift and WB through LLE.  Continues to overpower with RLE, therefore had RT assist with keeping RLE in place and keeping slight bend in RLE.  Requires max A in this manner to stand and sit.  Pt assisted back to w/c and propelled back to room.  Left in w/c in room with quick release belt donned.  All needs in reach.   Therapy Documentation Precautions:  Precautions Precautions: Fall Precaution Comments: L inattention, brace on L ankle from recent fx Restrictions Weight Bearing Restrictions: No   Pain: Pain Assessment Pain Assessment: No/denies pain Pain Score: 0-No pain   Locomotion : Wheelchair Mobility Distance: 150  See FIM for current functional status  Therapy/Group: Individual  Therapy  [name], Brady Schiller Ann 11/13/2014, 11:21 AM

## 2014-11-13 NOTE — Progress Notes (Signed)
Social Work Patient ID: Cory [name], male   DOB: 24-Jul-1961, 53 y.o.   MRN: [id number] Met with pt and left a message for his sister that he will stay here and continue to receive therapies until a safe discharge plan is developed. Looking at our own nursing facilities.  Pt is agreeable and reports: " I am working hard in therapies." He asked if spoke with Lynette-girlfriend to call him. He has not spoken to her, this worker asked why he states; " Because I am a man."

## 2014-11-13 NOTE — Progress Notes (Signed)
[facility] PHYSICAL MEDICINE & REHABILITATION     PROGRESS NOTE    Subjective/Complaints: No c/os,awake and ready to start OT   ROS limited due to mental status/affect  Objective: Vital Signs: Blood pressure 107/67, pulse 69, temperature 98.5 F (36.9 C), temperature source Oral, resp. rate 20, weight 77.4 kg (170 lb 10.2 oz), SpO2 100 %. Dg Swallowing Func-speech Pathology  11/11/2014   Mora Appl, CCC-SLP     11/11/2014  4:57 PM   Objective Swallowing Evaluation: Modified Barium Swallowing Study   Patient Details  Name: Cory [name] MRN: [id number] Date of Birth: Sep 18, 1961  Today's Date: 11/11/2014 Time:  1300- 1330   30 minutes   Past Medical History:  Past Medical History  Diagnosis Date  . Medical history non-contributory    Past Surgical History:  Past Surgical History  Procedure Laterality Date  . No past surgeries    . Tee without cardioversion N/A 10/16/2014    Procedure: TRANSESOPHAGEAL ECHOCARDIOGRAM (TEE);  Surgeon: Candee Furbish, MD;  Location: [facility] ENDOSCOPY;  Service: Cardiovascular;   Laterality: N/A;  . Loop recorder implant N/A 10/18/2014    Procedure: LOOP RECORDER IMPLANT;  Surgeon: Deboraha Sprang, MD;   Location: [facility] CATH LAB;  Service: Cardiovascular;  Laterality:  N/A;   HPI:  53 y.o. male with history of alcoholism was brought to the ER  with left facial and left-sided hemiplegia. CT head showed  features consistent for CVA on the right MCA territory. CXR  Suboptimal inspiration. No acute cardiopulmonary disease.Marland Kitchen  Poor  performance on bedside swallow eval with recs for MBS. MBS  completed and diet recommendation was for dys2/nectar due to incr  asp risk with thin.  Patient admitted to rehab and has been  participating in therapies; patient with functional improvements  at bedside as a result, repeat objective assessment warratned at  this time.     Recommendation/Prognosis  Clinical Impression:   Dysphagia Diagnosis: Moderate oral phase dysphagia;Mild  pharyngeal phase  dysphagia Clinical impression: Patient presents with functional  improvements in swallow function as compared to previous swallow  assessment.  Patient continues to demonstrate a primarily based  oral dysphagia marked by significant deficits in sensation and  awareness of textures and liquids in left oral cavity resulting  in left anterior loss of boluses and buccal pocketing as well as  inability to transit pill with thin liquids.  Despite premature  loss of all POs over base of tongue, patient is now demonstrating  improved airway protection resulting in no observed instances of  penetration or aspiration of thin liquids.  Impulsivity has also  improved but patient continues to require Mod cues for safety.   Recommend initiating a dysphagia 3 diet with thin liquids and  continued full supervision given ongoing cognitive deficits.    SLP educate patient regarding diet advancements as well as  continued restrictions via teach back, which he required Mod  question cues to restate following a 2-3 minute delay.      Swallow Evaluation Recommendations:  Diet Recommendations: Dysphagia 3 (Mechanical Soft);Thin liquid Liquid Administration via: Cup;Straw Medication Administration: Whole meds with puree Supervision: Patient able to self feed;Full supervision/cueing  for compensatory strategies Compensations: Small sips/bites;Slow rate;Check for  pocketing;Check for anterior loss Postural Changes and/or Swallow Maneuvers: Seated upright 90  degrees Oral Care Recommendations: Oral care BID Follow up Recommendations: 24 hour supervision/assistance;Home  health SLP    Prognosis:  Prognosis for Safe Diet Advancement: Good Barriers to Reach Goals: Cognitive deficits  Individuals Consulted: Consulted and Agree with Results and  Recommendations: Patient      SLP Assessment/Plan  Plan:  See care plan of details    Short Term Goals: Week 4: SLP Short Term Goal 1 (Week 4): Patient  will recall and utilize recommended safe swallow  strategies with  Dys. 2 textures and nectar-thick liquids with Min vebral cues to  utilize safe swallow strategies to minimize overt s/s of  aspiration.  SLP Short Term Goal 2 (Week 4): Patient will attend to left of  environment during functional tasks with Min question cues. SLP Short Term Goal 3 (Week 4): Patient will demonstrate  sustained attention to a basic task for 15 minutes with Min  verbal cues for redirection. SLP Short Term Goal 4 (Week 4): Patient will label 2 physical and  2 cognitive deficits that have resulted from his CVA with Min  question cues.  SLP Short Term Goal 5 (Week 4): Patient will request help as  needed duirng functional tasks with Mod question cues.      General: Date of Onset: 10/15/14 Type of Study: Modified Barium Swallowing Study Reason for Referral: Objectively evaluate swallowing function Diet Prior to this Study: Dysphagia 2 (chopped);Nectar-thick  liquids Temperature Spikes Noted: No Respiratory Status: Room air History of Recent Intubation: No Behavior/Cognition: Cooperative;Pleasant  mood;Lethargic;Impulsive;Requires cueing Oral Cavity - Dentition: Missing dentition Oral Motor / Sensory Function: Impaired - see Bedside swallow  eval Self-Feeding Abilities: Able to feed self;Needs assist;Needs set  up Patient Positioning: Upright in chair Baseline Vocal Quality: Clear Volitional Cough: Strong Volitional Swallow: Able to elicit Anatomy: Within functional limits Pharyngeal Secretions: Not observed secondary MBS   Reason for Referral:   Objectively evaluate swallowing function    Oral Phase: Oral Preparation/Oral Phase Oral Phase: Impaired Oral - Solids Oral - Puree: Not tested Oral - Mechanical Soft: Left anterior bolus loss;Impaired  mastication;Weak lingual manipulation;Left pocketing in lateral  sulci Oral - Regular: Left anterior bolus loss;Impaired  mastication;Weak lingual manipulation;Left pocketing in lateral  sulci;Pocketing in anterior sulcus;Lingual/palatal residue  Oral - Pill: Other (Comment) (unable to orally transit)   Pharyngeal Phase:  Pharyngeal Phase Pharyngeal Phase: Impaired Pharyngeal - Nectar Pharyngeal - Nectar Cup: Premature spillage to pyriform sinuses Penetration/Aspiration details (nectar cup): Material does not  enter airway Pharyngeal - Thin Pharyngeal - Thin Cup: Premature spillage to pyriform sinuses Penetration/Aspiration details (thin cup): Material does not  enter airway Pharyngeal - Thin Straw: Premature spillage to pyriform sinuses Penetration/Aspiration details (thin straw): Material does not  enter airway Pharyngeal - Solids Pharyngeal - Puree: Not tested Pharyngeal - Mechanical Soft: Premature spillage to valleculae Pharyngeal - Regular: Premature spillage to valleculae Pharyngeal - Pill: Other (Comment) (unable to observe due to  inability to orally transit )   Cervical Esophageal Phase  Cervical Esophageal Phase Cervical Esophageal Phase: [facility]   GN        Gunnar Fusi, M.A., CCC-SLP [phone]  BOWIE,MELISSA 11/11/2014, 2:01 PM                    No results for input(s): WBC, HGB, HCT, PLT in the last 72 hours.  Recent Labs  11/13/14 0540  CREATININE 0.97   CBG (last 3)  No results for input(s): GLUCAP in the last 72 hours.  Wt Readings from Last 3 Encounters:  10/23/14 77.4 kg (170 lb 10.2 oz)  10/15/14 79.289 kg (174 lb 12.8 oz)  09/03/14 86.183 kg (190 lb)    Physical Exam:  Constitutional: He  is oriented to person, place, and time. He appears well-developed and well-nourished.  HENT:  Head: Normocephalic and atraumatic.  Eyes: Conjunctivae are normal. Pupils are equal, round, and reactive to light.   Neck: Normal range of motion. Neck supple.  Cardiovascular: Normal rate and regular rhythm.  Respiratory: Effort normal and breath sounds normal. No respiratory distress. He has no wheezes.  GI: Soft. Bowel sounds are normal.  Musculoskeletal: He exhibits mild pain with Left hip Int/ext rotation Neurological: He is  alert and oriented to person, place, and time. Delayed processing.  Left facial weakness with mild dysarthria and left inattention.   Has poor insight and awareness of deficits and easily distracted . Motor strength 0/5 in left deltoid, biceps, triceps, 0/5 to?tr grip, hip flexor, trace knee extensor, 0 ankle dorsiflexion plantar flexion MAS 1 Left hamstrings, MAS 3 in L Hip Add 5/5 on the right side in the same muscle groups.  Absent sensation in the left upper and left lower extremity.  Visual fields absent to confrontation testing on left side  Skin: Skin is warm and dry.    Assessment/Plan: 1. Functional deficits secondary to right MCA infarct, ?thrombotic, which require 3+ hours per day of interdisciplinary therapy in a comprehensive inpatient rehab setting. Physiatrist is providing close team supervision and 24 hour management of active medical problems listed below. Physiatrist and rehab team continue to assess barriers to discharge/monitor patient progress toward functional and medical goals. Team conference today please see physician documentation under team conference tab, met with team face-to-face to discuss problems,progress, and goals. Formulized individual treatment plan based on medical history, underlying problem and comorbidities.FIM: FIM - Bathing Bathing Steps Patient Completed: Chest, Right Arm, Left Arm, Abdomen, Front perineal area, Buttocks, Right upper leg, Left upper leg, Right lower leg (including foot), Left lower leg (including foot) Bathing: 4: Min-Patient completes 8-9 34f10 parts or 75+ percent  FIM - Upper Body Dressing/Undressing Upper body dressing/undressing steps patient completed: Thread/unthread right sleeve of pullover shirt/dresss, Put head through opening of pull over shirt/dress, Pull shirt over trunk Upper body dressing/undressing: 4: Min-Patient completed 75 plus % of tasks FIM - Lower Body Dressing/Undressing Lower body dressing/undressing steps  patient completed: Thread/unthread right underwear leg, Thread/unthread right pants leg, Don/Doff right sock, Don/Doff right shoe Lower body dressing/undressing: 3: Mod-Patient completed 50-74% of tasks  FIM - Toileting Toileting steps completed by patient: Performs perineal hygiene Toileting Assistive Devices: Grab bar or rail for support Toileting: 2: Max-Patient completed 1 of 3 steps  FIM - TRadio producerDevices: Grab bars Toilet Transfers: 4-To toilet/BSC: Min A (steadying Pt. > 75%), 4-From toilet/BSC: Min A (steadying Pt. > 75%)  FIM - Bed/Chair Transfer Bed/Chair Transfer Assistive Devices: Arm rests, HOB elevated, Bed rails Bed/Chair Transfer: 5: Bed > Chair or W/C: Supervision (verbal cues/safety issues), 5: Chair or W/C > Bed: Supervision (verbal cues/safety issues), 5: Supine > Sit: Supervision (verbal cues/safety issues)  FIM - Locomotion: Wheelchair Distance: 150 Locomotion: Wheelchair: 4: Travels 150 ft or more: maneuvers on rugs and over door sillls with minimal assistance (Pt.>75%) FIM - Locomotion: Ambulation Locomotion: Ambulation Assistive Devices: Orthosis, WEnvironmental consultant- Hemi Ambulation/Gait Assistance: 2: Max assist Locomotion: Ambulation: 0: Activity did not occur  Comprehension Comprehension Mode: Auditory Comprehension: 5-Understands basic 90% of the time/requires cueing < 10% of the time  Expression Expression Mode: Verbal Expression Assistive Devices: 6-Talk trach valve Expression: 5-Expresses basic 90% of the time/requires cueing < 10% of the time.  Social Interaction Social Interaction  Mode: Asleep Social Interaction: 4-Interacts appropriately 75 - 89% of the time - Needs redirection for appropriate language or to initiate interaction.  Problem Solving Problem Solving Mode: Not assessed Problem Solving: 2-Solves basic 25 - 49% of the time - needs direction more than half the time to initiate, plan or complete simple  activities  Memory Memory Mode: Asleep Memory Assistive Devices: Memory book Memory: 2-Recognizes or recalls 25 - 49% of the time/requires cueing 51 - 75% of the time  Medical Problem List and Plan: 1. Functional deficits secondary to R-MCA infarct question embolic v/s thrombotic with resultant left hemiplegia with sensory deficits, left facial weakness, left neglect and dysphagia. 2. DVT Prophylaxis/Anticoagulation: Pharmaceutical: Lovenox 3. Pain Management: tylenol for h/a for now.  topamax for post stroke headache, improved will wean 4. Mood: Seems withdrawn--will monitor for now. LCSW to follow for support and evaluation.  5. Neuropsych: This patient is not capable of making decisions on his own behalf. 6. Skin/Wound Care: Routine pressure relief measures.  7. Fluids/Electrolytes/Nutrition: Monitor I/O. Follow up admission labs tomorrow 8. HTN: 9. H/o Alcohol abuse/Tobacco use: May d/c CIWA protocol. No signs of WD for several weeks 10. Dyslipidemia: 11.  Mild HyperK, ? Hemolysis , no obvious medication source, will recheck in am  12.  Hx subacute ankle fx (9-10wks wks ago) recheck xrays, healing oblique fibular fx , non displaced, will need either ankle splint or AFO when OOB, f/u ortho (Dr Lorin Mercy) as outpt 13.  Spasticity increasing primarily L hamstring, start zanaflex, monitor BP,  Botulinum to Hamstringshelpful , also develping Adductor and quad  spasticity     LOS (Days) 26 A FACE TO FACE EVALUATION WAS PERFORMED  KIRSTEINS,ANDREW E 11/13/2014 7:01 AM

## 2014-11-13 NOTE — Progress Notes (Signed)
Occupational Therapy Session Note  Patient Details  Name: Cory SleightStephen Ausburn MRN: [id number] Date of Birth: 1961-11-17  Today's Date: 11/13/2014 OT Individual Time: [id number] and [id number] OT Individual Time Calculation (min): 63 min and 30 min   Short Term Goals: Week 4:  OT Short Term Goal 1 (Week 4): STGs = LTGs secondary to upcoming discharge  Skilled Therapeutic Interventions/Progress Updates:  Session 1: Upon entering the room, pt supine in bed with no c/o pain this session. OT session with bathing at shower level, hemiplegic dressing techniques, functional transfers, weight bearing, bed mobility, L inattention, and safety awareness. Pt performing supine >sit with supervision as well as scoot transfer from bed to wheelchair. Pt transferred to TTB <> wheelchair with min A and without use of grab bar. Pt performing bathing with min A to wash R UE. Pt utilizing R hand over L in order to incorporate in bathing tasks. Pt also demonstrating ability to cross L LE over R LE for washing and dressing of L side. Pt performing dressing tasks seated in wheelchair at sink side with min A - Mod A for balance during dynamic standing balance for LB clothing management. Pt not requiring use of small step stool to assist with threading pant legs this session. LB dressing completed with Mod A. L arm tray and QRB donned with call bell within reach before exiting the room.   Session 2: Upon entering the room, pt supine in bed with no c/o pain. OT session with focus on L UE weight bearing, standing endurance, and L inattention. Supine >sit with supervision as well as supervision for scoot transfer from bed >wheelchair. Pt propelled self with R foot in wheelchair to ADL apartment but hitting many objects on L side secondary to inattention. Pt standing for 10 min and 11 minutes respectively with L knee blocked and Mod A for balance. While standing pt requiring assistance for weight bearing position onto L hand while  standing. Pt engaging in [place] card task while in standing position. Pt required Max verbal cues for weight shifting to the L during session. Cards placed on L with pt required to scan to L in order to identify if he was able to play a card or not. OT assisted pt back to room via wheelchair where QRB and arm tray donned. Call bell and all needed items within reach this session.    Therapy Documentation Precautions:  Precautions Precautions: Fall Precaution Comments: L inattention, brace on L ankle from recent fx Restrictions Weight Bearing Restrictions: No Pain: Pain Assessment Pain Score: 0-No pain     See FIM for current functional status  Therapy/Group: Individual Therapy  [name], Marceline Napierala L 11/13/2014, 8:27 AM

## 2014-11-13 NOTE — Patient Care Conference (Signed)
Inpatient RehabilitationTeam Conference and Plan of Care Update Date: 11/13/2014   Time: 11;20 AM    Patient Name: Cory [name]      Medical Record Number: [id number]  Date of Birth: 06/04/1961 Sex: Male         Room/Bed: 4M08C/4M08C-01 Payor Info: Payor: MEDICAID PENDING / Plan: MEDICAID PENDING / Product Type: *No Product type* /    Admitting Diagnosis: R MCA INFARCT  Admit Date/Time:  10/18/2014  4:07 PM Admission Comments: No comment available   Primary Diagnosis:  Right middle cerebral artery stroke Principal Problem: Right middle cerebral artery stroke  Patient Active Problem List   Diagnosis Date Noted  . 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 Left-sided neglect 10/23/2014  . Alterations of sensations following CVA (cerebrovascular accident) 10/23/2014  . CVA (cerebral infarction) 10/18/2014  . Right middle cerebral artery stroke   . Dyslipidemia   . Acute CVA (cerebrovascular accident) 10/15/2014  . Stroke 10/15/2014  . Alcoholism 10/15/2014    Expected Discharge Date: Expected Discharge Date: 11/06/14  Team Members Present: Physician leading conference: Dr. Claudette LawsAndrew Kirsteins Social Worker Present: Cory DerBecky Candia Kingsbury, LCSW Nurse Present: Cory PurlMaryann Barbour, RN PT Present: Cory [name], Cory [name];Cory [name], PT OT Present: Cory [name], OT SLP Present: Cory [name], SLP PPS Coordinator present : Cory DuckMarie Noel, RN, CRRN     Current Status/Progress Goal Weekly Team Focus  Medical   plateaued at min/mod ADLs, Min/Sup transfers, influenced by impulsivity and distractability, Diet upgrade  maintain med stability  Rec QD   Bowel/Bladder   Cont of B/B LBM 12/20  Remain continent of bowel and bladder  Offer toileting PRN   Swallow/Nutrition/ Hydration   Dys 3, thin liquids, full supervision due to cognition  Min assist with least restrictive diet   diet tolerance, use of swallowing strategies.    ADL's   S for bed mobility, S for transfers  wheelchair <> bed, Min A shower transfer, Min A bathing and UB dressing, set up a grooming, LB - mod A  min - mod A overall  L NMR, pt education, safety awarness, L inattention,    Mobility   S to min for bed mobility, S for transfers with mod to max safety and set up cues prior to transfer, mod A for dynamic stnading, max A gait with hemi walker.  Continues to be limited by severe cognitive deficits and decreased safety.   S transfers, mod A standing, max A therapy only gait goal  L NMR, attention to the L, transfers, gait, safety awareness.    Communication   supervision-min assist   supervision   continue to increase vocal intensity    Safety/Cognition/ Behavioral Observations  Mod assist   Min assist   continue to increase visual scanning to the left and safety awareness.     Pain   No complaints of pain  </=3  Assess pain every shift and PRN   Skin   Skin dry and intact, incision healed  No new skin breakdown  Assess skin every shift and PRN      *See Care Plan and progress notes for long and short-term goals.  Barriers to Discharge:    No safe discharge plan  Possible Resolutions to Barriers:     NH bed  Discharge Planning/Teaching Needs:  Working on gettting hospital NH to take pt. FL2 sent out, family not able to take care of pt. Girlfriend works days at [employer]      Team Discussion:  Pt is starting  to plateau in therapies-  Inattention, safety issues and impulsivity from stroke limit his progress in therapies. Still requiring max cues. Diet upgraded to Dys 3 thin liquids  Revisions to Treatment Plan:  Diet upgraded to Dys 3 thin-MD QD pt's therapies due to lack of progress      Lucy ChrisDupree, Jakaylee Sasaki G 11/13/2014, 12:55 PM

## 2014-11-13 NOTE — Progress Notes (Signed)
Speech Language Pathology Weekly Progress and Session Note  Patient Details  Name: Cory [name] MRN: [id number] Date of Birth: [birth year]  Beginning of progress report period: November 08, 2014 End of progress report period: November 13, 2014  Today's Date: 11/13/2014 SLP Individual Time: 1300-1330 SLP Individual Time Calculation (min): 30 min  Short Term Goals: Week 4: SLP Short Term Goal 1 (Week 4): Patient will recall and utilize recommended safe swallow strategies with Dys. 2 textures and nectar-thick liquids with Min vebral cues to utilize safe swallow strategies to minimize overt s/s of aspiration.  SLP Short Term Goal 1 - Progress (Week 4): Met SLP Short Term Goal 2 (Week 4): Patient will attend to left of environment during functional tasks with Min question cues. SLP Short Term Goal 2 - Progress (Week 4): Not met SLP Short Term Goal 3 (Week 4): Patient will demonstrate sustained attention to a basic task for 15 minutes with Min verbal cues for redirection. SLP Short Term Goal 3 - Progress (Week 4): Not met SLP Short Term Goal 4 (Week 4): Patient will label 2 physical and 2 cognitive deficits that have resulted from his CVA with Min question cues.  SLP Short Term Goal 4 - Progress (Week 4): Not met SLP Short Term Goal 5 (Week 4): Patient will request help as needed duirng functional tasks with Mod question cues.   SLP Short Term Goal 5 - Progress (Week 4): Not met    New Short Term Goals: Week 5: SLP Short Term Goal 1 (Week 5): Patient will recall and utilize recommended safe swallow strategies with Dys. 3 textures and thin liquids with Min vebral cues to utilize safe swallow strategies to minimize overt s/s of aspiration.  SLP Short Term Goal 2 (Week 5): Patient will attend to left of environment during functional tasks with Min question cues. SLP Short Term Goal 3 (Week 5): Patient will demonstrate sustained attention to a basic task for 15 minutes with Min verbal cues for  redirection. SLP Short Term Goal 4 (Week 5): Patient will label 2 physical and 2 cognitive deficits that have resulted from his CVA with Min question cues.  SLP Short Term Goal 5 (Week 5): Patient will request help as needed duirng functional tasks with Mod question cues.    Weekly Progress Updates:  Pt made gains in swallowing function only this reporting period and has been upgraded to dys 3 solids, thin liquids.  No functional changes noted for pt's cognition as pt continues to present with impulsivity, very poor awareness of deficits, decreased sustained attention, poor visual scanning to the left of the environment, and basic functional problem solving impairments for initiation, sequencing, and organization.  Due to the abovementioned impairments, pt also requires full supervision during meals for use of swallowing precautions to minimize overt s/s of aspiration with his currently prescribed diet.  Pt now QD for therapies and is awaiting SNF placement due to decreased family support and the need for extensive cognitive assistance.  SLP will continue to regularly reinforce education related to current goals and progress in therapy to facilitate carryover between therapy sessions.    Intensity: Minumum of 1-2 x/day, 30 to 90 minutes (QD therapies ) Frequency: 5 out of 7 days (QD therapies ) Duration/Length of Stay: SNF placement Treatment/Interventions: Cognitive remediation/compensation;Cueing hierarchy;Dysphagia/aspiration precaution training;Functional tasks;Environmental controls;Internal/external aids;Medication managment;Patient/family education;Speech/Language facilitation;Therapeutic Activities   Daily Session  Skilled Therapeutic Interventions: Pt was seen for skilled ST targeting goals for dysphagia and cognition.  Upon arrival, pt was  reclined in bed.  SLP provided min cues for sequencing and initiation to sit up at edge of bed.  Additionally, pt required overall mod assist verbal cues  for wheelchair preparation prior to transfer.  While propelling himself in the wheelchair, pt required overall mod-max assist verbal cues for awareness of obstacles on his left side.  SLP facilitated the session with skilled observations during cup sips of thin liquids with pt exhibiting no overt s/s of aspiration.  Goals updated on this date to reflect current progress and plan of care.          FIM:  Comprehension Comprehension Mode: Auditory Comprehension: 5-Understands basic 90% of the time/requires cueing < 10% of the time Expression Expression Mode: Verbal Expression: 5-Expresses basic 90% of the time/requires cueing < 10% of the time. Social Interaction Social Interaction: 3-Interacts appropriately 50 - 74% of the time - May be physically or verbally inappropriate. Problem Solving Problem Solving: 3-Solves basic 50 - 74% of the time/requires cueing 25 - 49% of the time Memory Memory: 3-Recognizes or recalls 50 - 74% of the time/requires cueing 25 - 49% of the time FIM - Eating Eating Activity: 5: Needs verbal cues/supervision  Pain Pain Assessment Pain Assessment: No/denies pain Pain Score: 0-No pain  Therapy/Group: Individual Therapy  [name], Selinda Orion 11/13/2014, 3:22 PM

## 2014-11-13 NOTE — Progress Notes (Signed)
Recreational Therapy Session Note  Patient Details  Name: Cory [name] MRN: [id number] Date of Birth: 03/05/1961 Today's Date: 11/13/2014  Pain: no c/o  Skilled Therapeutic Interventions/Progress Updates: Session focused on activity tolerance, sit-stands, weight shifting & weightbearing through LLE without UE support during co-treat with PT.  Pt requires +2 assist for standing & weight shifting onto LLE. Therapy/Group: Co-Treatment  Ahron Hulbert 11/13/2014, 2:58 PM

## 2014-11-13 NOTE — Progress Notes (Signed)
Occupational Therapy Weekly Progress Note  Patient Details  Name: Cory [name] MRN: [id number] Date of Birth: [birth year]  Beginning of progress report period: November 08, 2014 End of progress report period: November 14, 2014  Today's Date: 11/14/2014 OT Individual Time: 3825-0539 OT Individual Time Calculation (min): 45 min    Patient has met 7 of 12 long term goals. Short term goals not set secondary to estimated length of stay. Pt making very slow progress. Pt continued to require supervision for transfers bed <> wheelchair and min A transfer to TTB and toilet with max verbal cues for set up and safety as pt continues to be impulsive. Pt requiring manual assist for weight shifting and max A to place L hand into weight bearing position when standing for LB self care tasks. Pt would benefit for continued therapy services at discharge.  Patient continues to demonstrate the following deficits: decreased I in self care, decreased functional use of L UE/LE, balance, safety awareness, functional transfers, functional mobility, and postural control and therefore will continue to benefit from skilled OT intervention to enhance overall performance with BADL.  Patient progressing toward long term goals..  Continue plan of care. Pt will require 24/7 supervision or SNF with rehab after discharge from inpatient rehabilitation.   OT Short Term Goals Week 5:  OT Short Term Goal 1 (Week 5): STGs= LTGs secondary to expected discharge  Skilled Therapeutic Interventions/Progress Updates:   Upon entering room, pt supine in bed sleeping with no c/o pain this session. Pt requesting bathing at sink level in wheelchair. Transfer bed > wheelchair with supervision and Max verbal cues for set up. Pt engaged in grooming at sink with min verbal cues to locate items to the L of sink. Pt utilizing flip top tooth paste in order to increased independence with task. Pt performing bathing with Min A. However, pt required  Mod A for standing balance while washing peri area and buttocks. Therapist providing assistance to place L UE into weight bearing position on sink side during standing task as stabilizer. Pt requiring verbal cues, visual input from mirror, and manual facilitation for weight shifting. Pt seated in wheelchair with QRB and arm tray donned and call bell within reach.    Therapy Documentation Precautions:  Precautions Precautions: Fall Precaution Comments: L inattention, brace on L ankle from recent fx Restrictions Weight Bearing Restrictions: No Pain: Pain Assessment Pain Assessment: No/denies pain Pain Score: 0-No pain  See FIM for current functional status  Therapy/Group: Individual Therapy  Phineas Semen 11/13/2014, 12:35 PM

## 2014-11-14 ENCOUNTER — Inpatient Hospital Stay (HOSPITAL_COMMUNITY): Payer: Medicaid Other | Admitting: Occupational Therapy

## 2014-11-14 ENCOUNTER — Inpatient Hospital Stay (HOSPITAL_COMMUNITY): Payer: Medicaid Other | Admitting: Speech Pathology

## 2014-11-14 ENCOUNTER — Inpatient Hospital Stay (HOSPITAL_COMMUNITY): Payer: Self-pay | Admitting: Rehabilitation

## 2014-11-14 NOTE — Progress Notes (Signed)
Physical Therapy Session Note  Patient Details  Name: Cory SleightStephen Dacanay MRN: [id number] Date of Birth: 01/09/1961  Today's Date: 11/14/2014 PT Individual Time: 1100-1200 PT Individual Time Calculation (min): 60 min   Short Term Goals: Week 4:  PT Short Term Goal 1 (Week 4): Continue to address LTG's  Skilled Therapeutic Interventions/Progress Updates:   Pt received sitting in w/c in room, agreeable to therapy session.  Pt self propelled to/from therapy gym using R hemi technique at S level.  Noted marked improvement in speed of propulsion with only single cue for avoiding obstacle on the L.  Skilled session focused on sit<>stand transfers with emphasis on midline posture for equal WB.  While in standing worked on weight shifts midline and to the L with facilitation for L quad and glute activation.  Pt able to progress from mod A to min A within session for up to 10-15 secs at a time.  Difficulty when asked multiple tasks during standing, but was able to hold intermittently.  Then focused on trunk shortening and lengthening in sitting with feet unsupported.  Had pt reach for cards under hip with assist under L hip and also reached for cones R and L and upwards.  Single LOB posteriorly and to the L, however was able to sit back up on his own.  Ended session with seated nustep x 7 mins with LEs only (LLE held in place with theraband) at level 1 resistance for NMR through LLE.  Tolerated well and was better able to activate LLE today.  Some assist to maintain hip add on LLE.  Transferred back to w/c at S level (did better today with recalling steps and set up of w/c and parts) at mod A cuing level.  Assisted back to room and left in w/c with quick release belt donned and all needs in reach.   Therapy Documentation Precautions:  Precautions Precautions: Fall Precaution Comments: L inattention, brace on L ankle from recent fx Restrictions Weight Bearing Restrictions: No   Pain: Pain  Assessment Pain Assessment: 0-10 Pain Score: 2  Pain Location: Back Pain Descriptors / Indicators: Aching Patients Stated Pain Goal: 2 Pain Intervention(s): Medication (See eMAR)  See FIM for current functional status  Therapy/Group: Individual Therapy  [name], Gem Conkle Ann 11/14/2014, 11:27 AM

## 2014-11-14 NOTE — Progress Notes (Signed)
Speech Language Pathology Daily Session Note  Patient Details  Name: Cory SleightStephen [name] MRN: [id number] Date of Birth: [birth year]  Today's Date: 11/14/2014 SLP Individual Time: 1000-1030 SLP Individual Time Calculation (min): 30 min  Short Term Goals: Week 5: SLP Short Term Goal 1 (Week 5): Patient will recall and utilize recommended safe swallow strategies with Dys. 3 textures and thin liquids with Min vebral cues to utilize safe swallow strategies to minimize overt s/s of aspiration.  SLP Short Term Goal 2 (Week 5): Patient will attend to left of environment during functional tasks with Min question cues. SLP Short Term Goal 3 (Week 5): Patient will demonstrate sustained attention to a basic task for 15 minutes with Min verbal cues for redirection. SLP Short Term Goal 4 (Week 5): Patient will label 2 physical and 2 cognitive deficits that have resulted from his CVA with Min question cues.  SLP Short Term Goal 5 (Week 5): Patient will request help as needed duirng functional tasks with Mod question cues.    Skilled Therapeutic Interventions: Skilled treatment session focused on addressing dysphagia goals.  Per RN report patient has been tolerating current diet with consistent cues.  SLP facilitated session with upgraded trials of regular textures with thin liquids via cup; patient required Mod cues for portion control and pacing as well as extended time for mastication, self-monitor and correct left buccal pocketing and anterior loss.  Patient demonstrated no overt s/s of aspiration; however, due to continued impulsivity recommend continued trials with SLP prior to diet advancement.  Continue with current plan of care.   FIM:  Comprehension Comprehension Mode: Auditory Comprehension: 5-Understands basic 90% of the time/requires cueing < 10% of the time Expression Expression Mode: Verbal Expression: 5-Expresses basic 90% of the time/requires cueing < 10% of the time. Social  Interaction Social Interaction: 3-Interacts appropriately 50 - 74% of the time - May be physically or verbally inappropriate. Problem Solving Problem Solving: 3-Solves basic 50 - 74% of the time/requires cueing 25 - 49% of the time Memory Memory: 3-Recognizes or recalls 50 - 74% of the time/requires cueing 25 - 49% of the time FIM - Eating Eating Activity: 5: Needs verbal cues/supervision;4: Helper checks for pocketed food  Pain Pain Assessment Pain Assessment: No/denies pain Pain Score: 2   Therapy/Group: Individual Therapy  Charlane FerrettiMelissa Reniah Cottingham, M.A., CCC-SLP [phone]  Deatrice Spanbauer 11/14/2014, 12:36 PM

## 2014-11-15 NOTE — Progress Notes (Signed)
[facility] PHYSICAL MEDICINE & REHABILITATION     PROGRESS NOTE    Subjective/Complaints:    ROS limited due to mental status/affect  Objective: Vital Signs: Blood pressure 106/73, pulse 56, temperature 98.4 F (36.9 C), temperature source Oral, resp. rate 20, weight 76.204 kg (168 lb), SpO2 100 %. No results found. No results for input(s): WBC, HGB, HCT, PLT in the last 72 hours.  Recent Labs  11/13/14 0540  CREATININE 0.97   CBG (last 3)  No results for input(s): GLUCAP in the last 72 hours.  Wt Readings from Last 3 Encounters:  11/13/14 76.204 kg (168 lb)  10/15/14 79.289 kg (174 lb 12.8 oz)  09/03/14 86.183 kg (190 lb)    Physical Exam:  Constitutional: He is oriented to person, place, and time. He appears well-developed and well-nourished.  HENT:  Head: Normocephalic and atraumatic.  Eyes: Conjunctivae are normal. Pupils are equal, round, and reactive to light.   Neck: Normal range of motion. Neck supple.  Cardiovascular: Normal rate and regular rhythm.  Respiratory: Effort normal and breath sounds normal. No respiratory distress. He has no wheezes.  GI: Soft. Bowel sounds are normal.  Musculoskeletal: He exhibits mild pain with Left hip Int/ext rotation Neurological: He is alert and oriented to person, place, and time. Delayed processing.  Left facial weakness with mild dysarthria and left inattention.   Has poor insight and awareness of deficits and easily distracted . Motor strength 0/5 in left deltoid, biceps, triceps, 0/5 to?tr grip, hip flexor, 2- knee extensor, 0 ankle dorsiflexion plantar flexion MAS 1 Left hamstrings, MAS 3 in L Hip Add, MAS 2 in Quad 5/5 on the right side in the same muscle groups.  Absent sensation in the left upper and left lower extremity.  Visual fields absent to confrontation testing on left side  Skin: Skin is warm and dry.    Assessment/Plan: 1. Functional deficits secondary to right MCA infarct, ?thrombotic, which require 3+  hours per day of interdisciplinary therapy in a comprehensive inpatient rehab setting. Physiatrist is providing close team supervision and 24 hour management of active medical problems listed below. Physiatrist and rehab team continue to assess barriers to discharge/monitor patient progress toward functional and medical goals. [facility], progress inhibited by R MCA related cognitive deficits- impulsivity and poor awareness of Left side and deficits, cont QD program, stable for SNF        FIM: FIM - Bathing Bathing Steps Patient Completed: Chest, Left Arm, Abdomen, Front perineal area, Buttocks, Right upper leg, Left upper leg Bathing: 4: Min-Patient completes 8-9 2352f 10 parts or 75+ percent  FIM - Upper Body Dressing/Undressing Upper body dressing/undressing steps patient completed: Thread/unthread right sleeve of pullover shirt/dresss, Put head through opening of pull over shirt/dress, Pull shirt over trunk Upper body dressing/undressing: 4: Min-Patient completed 75 plus % of tasks FIM - Lower Body Dressing/Undressing Lower body dressing/undressing steps patient completed: Thread/unthread right pants leg, Pull pants up/down, Don/Doff right sock, Don/Doff right shoe Lower body dressing/undressing: 3: Mod-Patient completed 50-74% of tasks  FIM - Toileting Toileting steps completed by patient: Performs perineal hygiene Toileting Assistive Devices: Grab bar or rail for support Toileting: 2: Max-Patient completed 1 of 3 steps  FIM - Diplomatic Services operational officerToilet Transfers Toilet Transfers Assistive Devices: Grab bars Toilet Transfers: 4-To toilet/BSC: Min A (steadying Pt. > 75%), 4-From toilet/BSC: Min A (steadying Pt. > 75%)  FIM - Bed/Chair Transfer Bed/Chair Transfer Assistive Devices: Arm rests Bed/Chair Transfer: 5: Supine > Sit: Supervision (verbal  cues/safety issues), 5: Bed > Chair or W/C: Supervision (verbal cues/safety issues)  FIM - Locomotion: Wheelchair Distance: 150 Locomotion:  Wheelchair: 5: Travels 150 ft or more: maneuvers on rugs and over door sills with supervision, cueing or coaxing FIM - Locomotion: Ambulation Locomotion: Ambulation Assistive Devices: Orthosis, Environmental consultantWalker - Hemi Ambulation/Gait Assistance: 2: Max assist Locomotion: Ambulation: 0: Activity did not occur  Comprehension Comprehension Mode: Auditory Comprehension: 5-Understands basic 90% of the time/requires cueing < 10% of the time  Expression Expression Mode: Verbal Expression Assistive Devices: 6-Talk trach valve Expression: 5-Expresses basic 90% of the time/requires cueing < 10% of the time.  Social Interaction Social Interaction Mode: Asleep Social Interaction: 3-Interacts appropriately 50 - 74% of the time - May be physically or verbally inappropriate.  Problem Solving Problem Solving Mode: Not assessed Problem Solving: 3-Solves basic 50 - 74% of the time/requires cueing 25 - 49% of the time  Memory Memory Mode: Asleep Memory Assistive Devices: Memory book Memory: 3-Recognizes or recalls 50 - 74% of the time/requires cueing 25 - 49% of the time  Medical Problem List and Plan: 1. Functional deficits secondary to R-MCA infarct question embolic v/s thrombotic with resultant left hemiplegia with sensory deficits, left facial weakness, left neglect and dysphagia. 2. DVT Prophylaxis/Anticoagulation: Pharmaceutical: Lovenox 3. Pain Management: tylenol for h/a for now.  topamax for post stroke headache, improved will wean 4. Mood: Seems withdrawn--will monitor for now. LCSW to follow for support and evaluation.  5. Neuropsych: This patient is not capable of making decisions on his own behalf. 6. Skin/Wound Care: Routine pressure relief measures.  7. Fluids/Electrolytes/Nutrition: Monitor I/O. Follow up admission labs tomorrow 8. HTN: 9. H/o Alcohol abuse/Tobacco use: May d/c CIWA protocol. No signs of WD for several weeks 10. Dyslipidemia: 11.  Mild HyperK, ? Hemolysis , no  obvious medication source, will recheck in am  12.  Hx subacute ankle fx (9-10wks wks ago) recheck xrays, healing oblique fibular fx , non displaced, will need either ankle splint or AFO when OOB, f/u ortho (Dr Ophelia CharterYates) as outpt 13.  Spasticity increasing primarily L hamstring, start zanaflex, monitor BP,  Botulinum to Hamstrings helpful , also develping Adductor and quad  spasticity     LOS (Days) 28 A FACE TO FACE EVALUATION WAS PERFORMED  KIRSTEINS,ANDREW E 11/15/2014 8:50 AM

## 2014-11-16 ENCOUNTER — Inpatient Hospital Stay (HOSPITAL_COMMUNITY): Payer: Medicaid Other | Admitting: Occupational Therapy

## 2014-11-16 NOTE — Progress Notes (Signed)
  [facility] PHYSICAL MEDICINE & REHABILITATION     PROGRESS NOTE    Subjective/Complaints:  Patient denies pain. He is typing on his cell phone with 1 hand.  Objective: Vital Signs: Blood pressure 123/86, pulse 55, temperature 98.6 F (37 C), temperature source Oral, resp. rate 16, weight 168 lb (76.204 kg), SpO2 100 %.  Physical Exam:  No acute distress. Chest clear to auscultation. Cardiac exam S1 and S2 are regular. Abdominal exam active bowel sounds, soft. Extremities no edema.    Assessment/Plan: 1. Functional deficits secondary to right MCA infarct, ?thrombotic,   Medical Problem List and Plan: 1. Functional deficits secondary to R-MCA infarct question embolic v/s thrombotic with resultant left hemiplegia with sensory deficits, left facial weakness, left neglect and dysphagia. 2. DVT Prophylaxis/Anticoagulation: Pharmaceutical: Lovenox 3. Pain Management: Patient currently denies pain. 4. Mood: Seems withdrawn--will monitor for now. LCSW to follow for support and evaluation.  5. Neuropsych: This patient is not capable of making decisions on his own behalf. 6. Skin/Wound Care: Routine pressure relief measures.  7. Fluids/Electrolytes/Nutrition: Monitor I/O.  Basic Metabolic Panel:    Component Value Date/Time   NA 139 11/08/2014 0645   K 4.2 11/08/2014 0645   CL 104 11/08/2014 0645   CO2 22 11/08/2014 0645   BUN 16 11/08/2014 0645   CREATININE 0.97 11/13/2014 0540   GLUCOSE 96 11/08/2014 0645   CALCIUM 9.7 11/08/2014 0645    8. HTN: Blood pressure ranging 110-123/65-86 9. H/o Alcohol abuse/Tobacco use: May d/c CIWA protocol. No signs of WD for several weeks 10. Dyslipidemia: On Lipitor 11.  Mild HyperK, resolved  Lab Results  Component Value Date   K 4.2 11/08/2014   12.  Hx subacute ankle fx (9-10wks wks ago) recheck xrays, healing oblique fibular fx , non displaced, will need either ankle splint or AFO when OOB, f/u ortho (Dr Ophelia CharterYates) as outpt 13.   Spasticity increasing primarily L hamstring, start zanaflex, monitor BP,  Botulinum to Hamstrings helpful , also develping Adductor and quad  spasticity      LOS (Days) 29 A FACE TO FACE EVALUATION WAS PERFORMED  [facility] LLCWORDS,Callaway Hardigree HENRY 11/16/2014 9:28 AM

## 2014-11-16 NOTE — Progress Notes (Signed)
Occupational Therapy Session Note  Patient Details  Name: Cory [name] MRN: [id number] Date of Birth: January 17, 1961  Today's Date: 11/16/2014 OT Individual Time: 1130-1200 OT Individual Time Calculation (min): 30 min    Skilled Therapeutic Interventions/Progress Updates: Self ROM expcerises withc/o in pain in left posterior shoulder (on scalpula).  Paient presnted with tight trapezius muscles.   This clinician attempted to sretch trapezius and subscalpularis muscles - patient unable to tolerate positioning to reach subscalp. Muscles.    Patient trnaferred to w/c to his right with CGA squat pivot and then assisted to nurses station to eat lunch with other patients and supervision by nursing staff.     Therapy Documentation Precautions:  Precautions Precautions: Fall Precaution Comments: L inattention, brace on L ankle from recent fx Restrictions Weight Bearing Restrictions: No    Pain: denied except wen he flexed his left UE past 95 degrees, he co of lpain in what he described as posterior to this left shoulder.    Did not request pain meds   See FIM for current functional status  Therapy/Group: Individual Therapy  Cory [name], Cory [name] [facility] HospitalYeary 11/16/2014, 1:50 PM

## 2014-11-16 NOTE — Progress Notes (Signed)
Occupational Therapy Session Note  Patient Details  Name: Cory [name] MRN: [id number] Date of Birth: October 17, 1961  Today's Date: 11/16/2014 OT Individual Time: 5361-4431 OT Individual Time Calculation (min): 30 min    Short Term Goals: Week 1:  OT Short Term Goal 1 (Week 1): Pt will perform shower transfer with Mod A in order to increase I in functional transfer. OT Short Term Goal 1 - Progress (Week 1): Not met OT Short Term Goal 2 (Week 1): Pt will perform UB dressing with Mod A in order to increase I in self care. OT Short Term Goal 2 - Progress (Week 1): Met OT Short Term Goal 3 (Week 1): Pt will perform self ROM to L UE with supervision and verbal cues as needed for proper technique. OT Short Term Goal 3 - Progress (Week 1): Not met OT Short Term Goal 4 (Week 1): Pt will locate 3 items to the L during B & D session in order to increase L attention during functional task. OT Short Term Goal 4 - Progress (Week 1): Not met Week 2:  OT Short Term Goal 1 (Week 2): Pt will move/position arm in safe position during functional transfers and STS with min verbal guidance cues for increased safety awareness. OT Short Term Goal 1 - Progress (Week 2): Met OT Short Term Goal 2 (Week 2): Pt will donn B shoes and socks in order to increase I with LB dressing. OT Short Term Goal 2 - Progress (Week 2): Progressing toward goal OT Short Term Goal 3 (Week 2): Pt will locate 3 items to the L during B and D session with min verbal cues in order to increase attention to L during self care. OT Short Term Goal 3 - Progress (Week 2): Met OT Short Term Goal 4 (Week 2): Pt will perform toilet transfer with Min A in order to increase I in functional transfers OT Short Term Goal 4 - Progress (Week 2): Progressing toward goal Week 3:  OT Short Term Goal 1 (Week 3): progress towards long term goals based on estimated discharge date Week 4:  OT Short Term Goal 1 (Week 4): STGs = LTGs secondary to upcoming  discharge  Skilled Therapeutic Interventions/Progress Updates:    No c./o pain at rest.  Pt seen this session to address LUE tone and ROM. Pt received in w/c in his room. Pt able to propel himself to therapy gym and transfer to mat with squat pivot with supervision. Pt stated that he has shoulder pain when trying to flex or extend arm. He has moderate hypertone throughout arm and increased in internal rotation. Educated pt and instructed him in tone reducing strategies of trunk rotation in both sitting and supine with knees bent. Pt performed exercise well with repeat demonstration.  Pt practice self rom for external rotation with L hand behind head with no pain. Pt then worked on LUE weight bearing and weight shifting ant/post and L/R. Pt has trace active L arm adduction.  Pt then transferred back to w/c, quick release belt applied,  and pt. self propelled back to his room.  Therapy Documentation Precautions:  Precautions Precautions: Fall Precaution Comments: L inattention, brace on L ankle from recent fx Restrictions Weight Bearing Restrictions: No   ADL:  See FIM for current functional status  Therapy/Group: Individual Therapy  [place] 11/16/2014, 1:35 PM

## 2014-11-17 ENCOUNTER — Inpatient Hospital Stay (HOSPITAL_COMMUNITY): Payer: Self-pay | Admitting: Occupational Therapy

## 2014-11-17 ENCOUNTER — Inpatient Hospital Stay (HOSPITAL_COMMUNITY): Payer: Self-pay | Admitting: Physical Therapy

## 2014-11-17 NOTE — Progress Notes (Signed)
  [facility] PHYSICAL MEDICINE & REHABILITATION     PROGRESS NOTE    Subjective/Complaints:  Patient states that he feels well. He has no complaints.  Objective: Vital Signs: Blood pressure 122/81, pulse 61, temperature 97.7 F (36.5 C), temperature source Oral, resp. rate 16, weight 168 lb (76.204 kg), SpO2 100 %.  Physical Exam:   Patient is in no acute distress. Chest clear to auscultation. Cardiac exam S1 and S2 are regular. Abdominal exam active bowel sounds, soft. Extremities no edema. Neurologic exam he has a dense left hemiparesis. Assessment/Plan: 1. Functional deficits secondary to right MCA infarct, ?thrombotic,   Medical Problem List and Plan: 1. Functional deficits secondary to R-MCA infarct question embolic v/s thrombotic with resultant left hemiplegia with sensory deficits, left facial weakness, left neglect and dysphagia. 2. DVT Prophylaxis/Anticoagulation: Pharmaceutical: Lovenox 3. Pain Management: Patient currently denies pain. 4. Mood: Seems withdrawn--will monitor for now. LCSW to follow for support and evaluation.  5. Neuropsych: This patient is not capable of making decisions on his own behalf. 6. Skin/Wound Care: Routine pressure relief measures.  7. Fluids/Electrolytes/Nutrition: Monitor I/O.  Basic Metabolic Panel:    Component Value Date/Time   NA 139 11/08/2014 0645   K 4.2 11/08/2014 0645   CL 104 11/08/2014 0645   CO2 22 11/08/2014 0645   BUN 16 11/08/2014 0645   CREATININE 0.97 11/13/2014 0540   GLUCOSE 96 11/08/2014 0645   CALCIUM 9.7 11/08/2014 0645    8. HTN: Blood pressure ranging 120-122/81-85. Continue current medications. 9. H/o Alcohol abuse/Tobacco use: May d/c CIWA protocol. No signs of WD for several weeks 10. Dyslipidemia: On Lipitor 11.  Mild HyperK, resolved  Lab Results  Component Value Date   K 4.2 11/08/2014   12.  Hx subacute ankle fx (9-10wks wks ago) recheck xrays, healing oblique fibular fx , non displaced,  will need either ankle splint or AFO when OOB, f/u ortho (Dr Ophelia CharterYates) as outpt 13.  Spasticity increasing primarily L hamstring, start zanaflex, monitor BP,  Botulinum to Hamstrings helpful , also develping Adductor and quad  spasticity      LOS (Days) 30 A FACE TO FACE EVALUATION WAS PERFORMED  [facility] HillsWOR[facility],BRUCE HENRY 11/17/2014 7:51 AM

## 2014-11-17 NOTE — Progress Notes (Signed)
Physical Therapy Session Note  Patient Details  Name: Cory [name] MRN: [id number] Date of Birth: 18-Feb-1961  Today's Date: 11/17/2014 PT Individual Time: 0956-1026 PT Individual Time Calculation (min): 30 min   Short Term Goals: Week 3:  PT Short Term Goal 1 (Week 3): =LTG's due to ELOS  Therapy Documentation Precautions:  Precautions Precautions: Fall Precaution Comments: L inattention, brace on L ankle from recent fx Restrictions Weight Bearing Restrictions: No Pain: Pain Assessment Pain Assessment: No/denies pain   Locomotion : Wheelchair Mobility Distance: 175 feet   Patient received seating in wheelchair. Patient self propelled wheelchair from room to therapy gym 175 feet with verbal cues provided throughout for attention, environmental awareness and obstacle negotiation.   Pre-gait activities: Patient performed stepping for approximately 2 minutes and 4 minutes separate trials with RLE mod assist in order to increase weight bearing of LLE for strengthening and initiation of protective stepping strategies.  Approximation applied at left knee to prevent buckling.   Stair negotiation: Patient negotiated 12 steps with right handrail and mod assist. Patient performed stairs with a step to pattern. Patient descended stairs backwards. Patient required mod assist for upright posture, even weight distribution, advancement, placement and stablization of LLE. Patient educated on proper sequence and technique and was able to return demonstration with minimal verbal cues.   Patient returned to room at end of session with all needs met resting comfortably in wheelchair chair at bedside with wheelchair seat belt engaged.  Call bell within reach and patient educated not to be up without assistance. Patient verbalized understanding.   Therapy/Group: Individual Therapy  Retta Diones 11/17/2014, 12:29 PM

## 2014-11-18 ENCOUNTER — Inpatient Hospital Stay (HOSPITAL_COMMUNITY): Payer: Self-pay | Admitting: Rehabilitation

## 2014-11-18 ENCOUNTER — Inpatient Hospital Stay (HOSPITAL_COMMUNITY): Payer: Medicaid Other

## 2014-11-18 ENCOUNTER — Inpatient Hospital Stay (HOSPITAL_COMMUNITY): Payer: Self-pay | Admitting: Speech Pathology

## 2014-11-18 NOTE — Progress Notes (Signed)
Occupational Therapy Session Note  Patient Details  Name: Cory [name] MRN: [id number] Date of Birth: 05-Jan-1961  Today's Date: 11/18/2014 OT Individual Time: 1400-1430 OT Individual Time Calculation (min): 30 min    Short Term Goals: Week 4:  OT Short Term Goal 1 (Week 4): STGs = LTGs secondary to upcoming discharge  Skilled Therapeutic Interventions/Progress Updates:  Therapeutic exercise with focus on improved integration of LUE, dynamic sitting balance, and family ed (brother and sister from [place] visiting in room).    Pt completed SROM of LUE seated at edge of bed, unsupported, fingers interlaced, with hand guidance to perform exercises correctly.   Written HEP provided and placed in Pt Notebook (3-ring binder).     Pt demo'd decreased hypertonicity of LUE after exercises and returned to w/c with mod assist (lift and weight-shifting) and instructional cues while during transfer toward left side from edge of bed.     Therapy Documentation Precautions:  Precautions Precautions: Fall Precaution Comments: L inattention, brace on L ankle from recent fx Restrictions Weight Bearing Restrictions: No  Vital Signs: Therapy Vitals Temp: 98.3 F (36.8 C) Temp Source: Oral Pulse Rate: 99 Resp: 18 BP: 123/85 mmHg Patient Position (if appropriate): Sitting Oxygen Therapy SpO2: 100 % O2 Device: Not Delivered   Pain: No/denies pain    See FIM for current functional status  Therapy/Group: Individual Therapy  Ramona Ruark 11/18/2014, 3:00 PM

## 2014-11-18 NOTE — Progress Notes (Signed)
Physical Therapy Session Note  Patient Details  Name: Cory SleightStephen Sava MRN: [id number] Date of Birth: 19-May-1961  Today's Date: 11/18/2014 PT Individual Time: 1138-1208 PT Individual Time Calculation (min): 30 min   Short Term Goals: Week 4:  PT Short Term Goal 1 (Week 4): Continue to address LTG's  Skilled Therapeutic Interventions/Progress Updates:   Pt received lying in bed, agreeable to therapy session.  Performed bed mobility with HOB flat and without rails at S level. Performed transfer to w/c at S level and did much better in controlled environment on set up and safety of w/c parts prior to transfer.  Self propelled to therapy gym using R hemi technique at S level with min cues for technique.  Transferred to therapy mat in gym at S level but requires total A cues to not transfer due to brakes not locked on multiple attempts. Skilled session with RT to focus on sit<>stand for equal WB, midline posture, dynamic standing balance with RT providing assist at R LE to prevent him from moving it closer to midline and to assist with R knee flex to decrease pushing to the L.  PT on the L to facilitate upright posture, L knee extension, glute activation, trunk rotation to midline, and weight shift to the L with reaching activity.  Requires mod to max A at times for balance with pt having no ability to correct posture when falling to the L.  Ended session with gait x 25' using hemi walker with max A (+2 for chair follow).  Require assist for placement of LLE and stabilization of L knee.  Also requires assist for upright posture and hip extension and max verbal cues for sequencing with hemi walker and waiting to step RLE when LLE stabilized.  Provided mirror for increased visual feedback, however requires max verbal cues to attend to mirror.  Pt assisted back to room and left in w/c with quick release belt donned all needs in reach.   Therapy Documentation Precautions:  Precautions Precautions:  Fall Precaution Comments: L inattention, brace on L ankle from recent fx Restrictions Weight Bearing Restrictions: No  Pain: Pain Assessment Pain Assessment: No/denies pain  See FIM for current functional status  Therapy/Group: Individual Therapy  [name], Zaryah Seckel Ann 11/18/2014, 12:55 PM

## 2014-11-18 NOTE — Progress Notes (Signed)
[facility] PHYSICAL MEDICINE & REHABILITATION     PROGRESS NOTE    Subjective/Complaints: No new complaints   ROS limited due to mental status/affect  Objective: Vital Signs: Blood pressure 130/94, pulse 78, temperature 98.2 F (36.8 C), temperature source Oral, resp. rate 18, weight 76.204 kg (168 lb), SpO2 100 %. No results found. No results for input(s): WBC, HGB, HCT, PLT in the last 72 hours. No results for input(s): NA, K, CL, GLUCOSE, BUN, CREATININE, CALCIUM in the last 72 hours.  Invalid input(s): CO CBG (last 3)  No results for input(s): GLUCAP in the last 72 hours.  Wt Readings from Last 3 Encounters:  11/13/14 76.204 kg (168 lb)  10/15/14 79.289 kg (174 lb 12.8 oz)  09/03/14 86.183 kg (190 lb)    Physical Exam:  Constitutional: He is oriented to person, place, and time. He appears well-developed and well-nourished.  HENT:  Head: Normocephalic and atraumatic.  Eyes: Conjunctivae are normal. Pupils are equal, round, and reactive to light.   Neck: Normal range of motion. Neck supple.  Cardiovascular: Normal rate and regular rhythm.  Respiratory: Effort normal and breath sounds normal. No respiratory distress. He has no wheezes.  GI: Soft. Bowel sounds are normal.  Musculoskeletal: He exhibits mild pain with Left hip Int/ext rotation Neurological: He is alert and oriented to person, place, and time. Delayed processing.  Left facial weakness with mild dysarthria and left inattention.   Has poor insight and awareness of deficits and easily distracted . Motor strength 0/5 in left deltoid, biceps, triceps, 0/5 to?tr grip, hip flexor, 2- knee extensor, 0 ankle dorsiflexion plantar flexion MAS 1 Left hamstrings, MAS 3 in L Hip Add, MAS 2 in Quad 5/5 on the right side in the same muscle groups.  Absent sensation in the left upper and left lower extremity.  Visual fields absent to confrontation testing on left side  Skin: Skin is warm and dry.    Assessment/Plan: 1.  Functional deficits secondary to right MCA infarct, ?thrombotic, which require 3+ hours per day of interdisciplinary therapy in a comprehensive inpatient rehab setting. Physiatrist is providing close team supervision and 24 hour management of active medical problems listed below. Physiatrist and rehab team continue to assess barriers to discharge/monitor patient progress toward functional and medical goals.  SNF pending  FIM: FIM - Bathing Bathing Steps Patient Completed: Chest, Left Arm, Abdomen, Front perineal area, Buttocks, Right upper leg, Left upper leg Bathing: 4: Min-Patient completes 8-9 1546f 10 parts or 75+ percent  FIM - Upper Body Dressing/Undressing Upper body dressing/undressing steps patient completed: Thread/unthread right sleeve of pullover shirt/dresss, Put head through opening of pull over shirt/dress, Pull shirt over trunk Upper body dressing/undressing: 4: Min-Patient completed 75 plus % of tasks FIM - Lower Body Dressing/Undressing Lower body dressing/undressing steps patient completed: Thread/unthread right pants leg, Pull pants up/down, Don/Doff right sock, Don/Doff right shoe Lower body dressing/undressing: 3: Mod-Patient completed 50-74% of tasks  FIM - Toileting Toileting steps completed by patient: Performs perineal hygiene Toileting Assistive Devices: Grab bar or rail for support Toileting: 2: Max-Patient completed 1 of 3 steps  FIM - Diplomatic Services operational officerToilet Transfers Toilet Transfers Assistive Devices: Grab bars Toilet Transfers: 4-To toilet/BSC: Min A (steadying Pt. > 75%), 4-From toilet/BSC: Min A (steadying Pt. > 75%)  FIM - Bed/Chair Transfer Bed/Chair Transfer Assistive Devices: Arm rests Bed/Chair Transfer: 4: Bed > Chair or W/C: Min A (steadying Pt. > 75%), 4: Chair or W/C > Bed: Min A (steadying Pt. > 75%)  FIM -  Locomotion: Wheelchair Distance: 175 feet Locomotion: Wheelchair: 5: Travels 150 ft or more: maneuvers on rugs and over door sills with supervision,  cueing or coaxing FIM - Locomotion: Ambulation Locomotion: Ambulation Assistive Devices: Orthosis, Environmental consultantWalker - Hemi Ambulation/Gait Assistance: 2: Max assist Locomotion: Ambulation: 0: Activity did not occur  Comprehension Comprehension Mode: Auditory Comprehension: 5-Understands basic 90% of the time/requires cueing < 10% of the time  Expression Expression Mode: Verbal Expression Assistive Devices: 6-Talk trach valve Expression: 5-Expresses basic 90% of the time/requires cueing < 10% of the time.  Social Interaction Social Interaction Mode: Asleep Social Interaction: 4-Interacts appropriately 75 - 89% of the time - Needs redirection for appropriate language or to initiate interaction.  Problem Solving Problem Solving Mode: Not assessed Problem Solving: 3-Solves basic 50 - 74% of the time/requires cueing 25 - 49% of the time  Memory Memory Mode: Asleep Memory Assistive Devices: Memory book Memory: 4-Recognizes or recalls 75 - 89% of the time/requires cueing 10 - 24% of the time  Medical Problem List and Plan: 1. Functional deficits secondary to R-MCA infarct question embolic v/s thrombotic with resultant left hemiplegia with sensory deficits, left facial weakness, left neglect and dysphagia. 2. DVT Prophylaxis/Anticoagulation: Pharmaceutical: Lovenox 3. Pain Management: tylenol for h/a for now.  topamax for post stroke headache, improved will wean 4. Mood: Seems withdrawn--will monitor for now. LCSW to follow for support and evaluation.  5. Neuropsych: This patient is not capable of making decisions on his own behalf. 6. Skin/Wound Care: Routine pressure relief measures.  7. Fluids/Electrolytes/Nutrition: Monitor I/O. Follow up admission labs tomorrow 8. HTN: 9. H/o Alcohol abuse/Tobacco use: May d/c CIWA protocol. No signs of WD for several weeks 10. Dyslipidemia: 11.  Mild HyperK, ? Hemolysis , no obvious medication source, will recheck in am  12.  Hx subacute ankle fx  (9-10wks wks ago) recheck xrays, healing oblique fibular fx , non displaced, will need either ankle splint or AFO when OOB, f/u ortho (Dr Ophelia CharterYates) as outpt 13.  Spasticity increasing primarily L hamstring, start zanaflex, monitor BP,  Botulinum to Hamstrings helpful , also develping Adductor and quad  spasticity       LOS (Days) 31 A FACE TO FACE EVALUATION WAS PERFORMED  Rosemary Mossbarger T 11/18/2014 8:27 AM

## 2014-11-18 NOTE — Progress Notes (Signed)
Speech Language Pathology Daily Session Note  Patient Details  Name: Cory [name] MRN: [id number] Date of Birth: Dec 20, 1960  Today's Date: 11/18/2014 SLP Individual Time: [id number] SLP Individual Time Calculation (min): 30 min  Short Term Goals: Week 5: SLP Short Term Goal 1 (Week 5): Patient will recall and utilize recommended safe swallow strategies with Dys. 3 textures and thin liquids with Min vebral cues to utilize safe swallow strategies to minimize overt s/s of aspiration.  SLP Short Term Goal 2 (Week 5): Patient will attend to left of environment during functional tasks with Min question cues. SLP Short Term Goal 3 (Week 5): Patient will demonstrate sustained attention to a basic task for 15 minutes with Min verbal cues for redirection. SLP Short Term Goal 4 (Week 5): Patient will label 2 physical and 2 cognitive deficits that have resulted from his CVA with Min question cues.  SLP Short Term Goal 5 (Week 5): Patient will request help as needed duirng functional tasks with Mod question cues.    Skilled Therapeutic Interventions:  Pt was seen for skilled ST targeting cognitive goals.  Upon arrival, pt was reclined in bed playing with his cell phone but was easily redirected to structured therapeutic tasks with min cues.  Pt continues to require overall min-mod cues for initiation and sequencing for wheelchair set up and mod cues for safety due to impulsivity when transferring from bed to wheelchair.  Pt with poor awareness of errors.  Pt also continues to require mod verbal cues for awareness of obstacles on his left when propelling his wheelchair in the hallway.  SLP facilitated the session with a 4 step picture sequencing task targeting basic problem solving and attention.  Pt completed the abovementioned task for ~80% with overall min-mod assist cues for organization and error awareness.  Continue per current plan of care.   FIM:  Comprehension Comprehension Mode:  Auditory Comprehension: 5-Understands basic 90% of the time/requires cueing < 10% of the time Expression Expression Mode: Verbal Expression: 5-Expresses basic 90% of the time/requires cueing < 10% of the time. Social Interaction Social Interaction: 4-Interacts appropriately 75 - 89% of the time - Needs redirection for appropriate language or to initiate interaction. Problem Solving Problem Solving: 3-Solves basic 50 - 74% of the time/requires cueing 25 - 49% of the time Memory Memory: 2-Recognizes or recalls 25 - 49% of the time/requires cueing 51 - 75% of the time  Pain Pain Assessment Pain Assessment: No/denies pain  Therapy/Group: Individual Therapy  Mihir Flanigan, Melanee SpryNicole L 11/18/2014, 3:53 PM

## 2014-11-18 NOTE — Progress Notes (Signed)
Recreational Therapy Session Note  Patient Details  Name: Cory SleightStephen [name] MRN: [id number] Date of Birth: [birth year] Today's Date: 11/18/2014  Pain: no c/o Skilled Therapeutic Interventions/Progress Updates: Session focused on activity tolerance, w/c mobility, sit-stands, WBing through LLE, midline stance.  Pt propelled w/c from room to gym with supervision & min cues.  Pt performed scoot pivot transfer with supervision & total assist/cues due to decreased safety awareness.  Pt attempted to transfer without the w/c brakes locked multiple times.  Pt performed sit-stands with min-max assist for balance activity.  Mod-max assist needed to correct posture and prevent falling to the left with activity.  Pt required mod-max instructional cues.  Therapy/Group: Co-Treatment   Cory [name] 11/18/2014, 3:48 PM

## 2014-11-18 NOTE — Plan of Care (Signed)
Problem: RH BLADDER ELIMINATION Goal: RH STG MANAGE BLADDER WITH ASSISTANCE STG Manage Bladder With Mod I Assistance  Outcome: Progressing No incontinent episode

## 2014-11-19 ENCOUNTER — Inpatient Hospital Stay (HOSPITAL_COMMUNITY): Payer: Medicaid Other

## 2014-11-19 ENCOUNTER — Inpatient Hospital Stay (HOSPITAL_COMMUNITY): Payer: Medicaid Other | Admitting: Physical Therapy

## 2014-11-19 ENCOUNTER — Inpatient Hospital Stay (HOSPITAL_COMMUNITY): Payer: Self-pay | Admitting: Speech Pathology

## 2014-11-19 ENCOUNTER — Inpatient Hospital Stay (HOSPITAL_COMMUNITY): Payer: Self-pay | Admitting: *Deleted

## 2014-11-19 NOTE — Progress Notes (Signed)
Occupational Therapy Session Note  Patient Details  Name: Cory SleightStephen [name] MRN: [id number] Date of Birth: 1961/01/09  Today's Date: 11/19/2014 OT Individual Time: 1100-1200 OT Individual Time Calculation (min): 60 min    Short Term Goals: Week 3:  OT Short Term Goal 1 (Week 3): progress towards long term goals based on estimated discharge date  Skilled Therapeutic Interventions/Progress Updates: ADL-retraining with focus on improved adapted bathing/dressing skills and attention to left.    Pt received supine in bed occupied with his cell phone and dressed although aware of planned ADL session.   Pt recovered to sit at edge of bed unassisted and completed transfer to w/c with setup assist to position w/c.   Pt aware of need to flip armrest back prior to squat pivot transfer during this session and he completed transfer with only standby assist.     With extra time, pt propelled w/c to gather clothing and proceed to shower.    OT provided supervision as pt maneuvered w/c beside tub bench as pt attempted transfer from distance too far from grab bar.    Pt then transferred using grab bar, unassisted to pivot to bench and there completed bathing with steadying assist to stand to wash buttocks and periarea.   Pt returned to w/c and was assisted to sink to dress, standing supported at sink with min assist to pull up underwear and pants.    Pt required assist to don left ankle orthosis and shoe and vc to reposition in w/c while grooming.    Pt demo's excellent compensation for left hemiplegia but continues to requires phyiscal assist with managing left upper/lower body during ADL and steadying assist to maintain balance while  attempting to stand.   Pt left in w/c at end of session awaiting lunch, QRB attached and call light placed within reach.      Therapy Documentation Precautions:  Precautions Precautions: Fall Precaution Comments: L inattention, brace on L ankle from recent fx Restrictions Weight  Bearing Restrictions: No   Pain: No/denies pain   See FIM for current functional status  Therapy/Group: Individual Therapy  Loyed Wilmes 11/19/2014, 12:43 PM

## 2014-11-19 NOTE — Progress Notes (Signed)
Recreational Therapy Session Note  Patient Details  Name: Cory SleightStephen [name] MRN: [id number] Date of Birth: [birth year] Today's Date: 11/19/2014  Pain: no c/o Skilled Therapeutic Interventions/Progress Updates: Session focused on activity tolerance, w/c mobility, furniture transfers.  Pt propelled w/c from [facility] with supervision, min cues to avoid obstacles.  Pt performed scoot pivot transfer to couch (to the right) with supervision/ 1 verbal cue for safety and then back to w/c (to the left) with supervision & mod cues.   Therapy/Group: Individual Therapy Shandell Jallow 11/19/2014, 2:49 PM

## 2014-11-19 NOTE — Progress Notes (Signed)
Speech Language Pathology Daily Session Note  Patient Details  Name: Cory SleightStephen Griffing MRN: [id number] Date of Birth: [birth year]  Today's Date: 11/19/2014 SLP Individual Time: 0900-0927 SLP Individual Time Calculation (min): 27 min  Short Term Goals: Week 5: SLP Short Term Goal 1 (Week 5): Patient will recall and utilize recommended safe swallow strategies with Dys. 3 textures and thin liquids with Min vebral cues to utilize safe swallow strategies to minimize overt s/s of aspiration.  SLP Short Term Goal 2 (Week 5): Patient will attend to left of environment during functional tasks with Min question cues. SLP Short Term Goal 3 (Week 5): Patient will demonstrate sustained attention to a basic task for 15 minutes with Min verbal cues for redirection. SLP Short Term Goal 4 (Week 5): Patient will label 2 physical and 2 cognitive deficits that have resulted from his CVA with Min question cues.  SLP Short Term Goal 5 (Week 5): Patient will request help as needed duirng functional tasks with Mod question cues.    Skilled Therapeutic Interventions:  Pt was seen for skilled ST targeting cognitive goals.  Upon arrival, pt was seated upright in wheelchair, awake, alert, and listening to music on his phone while the TV was on.  SLP turned TV off and provided supervision instructional cues for pt to turn off his phone to maximize sustained attention during structured therapeutic tasks.  SLP facilitated the session with a structured reading task targeting visual scanning to the left and working memory.  Pt required overall min assist multimodal cues to scan to the left of the Ona Roehrs via previously taught compensatory strategies such as finger tracking.  SLP also facilitated the session with mod-max cues for recall of paragraph details due to decreased working memory.  Additionally, pt utilized his schedule to answer basic, time management questions and facilitate improved recall of daily information with min  question cues.  Continue per current plan of care.   FIM:  Comprehension Comprehension Mode: Auditory Comprehension: 5-Understands basic 90% of the time/requires cueing < 10% of the time Expression Expression Mode: Verbal Expression: 4-Expresses basic 75 - 89% of the time/requires cueing 10 - 24% of the time. Needs helper to occlude trach/needs to repeat words. Social Interaction Social Interaction: 4-Interacts appropriately 75 - 89% of the time - Needs redirection for appropriate language or to initiate interaction. Problem Solving Problem Solving: 3-Solves basic 50 - 74% of the time/requires cueing 25 - 49% of the time Memory Memory: 4-Recognizes or recalls 75 - 89% of the time/requires cueing 10 - 24% of the time FIM - Eating Eating Activity: 5: Supervision/cues  Pain Pain Assessment Pain Assessment: No/denies pain  Therapy/Group: Individual Therapy   Jackalyn LombardNicole Jebadiah Imperato, M.A. CCC-SLP  Makinzey Banes, Melanee SpryNicole L 11/19/2014, 3:55 PM

## 2014-11-19 NOTE — Progress Notes (Signed)
Physical Therapy Session Note  Patient Details  Name: Cory SleightStephen [name] MRN: [id number] Date of Birth: 11-22-1961  Today's Date: 11/19/2014 PT Individual Time: 1400-1430 PT Individual Time Calculation (min): 30 min   Short Term Goals: Week 4:  PT Short Term Goal 1 (Week 4): Continue to address LTG's  Skilled Therapeutic Interventions/Progress Updates:   Pt received supine in bed, agreeable to therapy. Safety plan updated to allow pt to be left alone in room with quick release belt in place per primary PT. Pt at supervision level for bed mobility and transfers. Sitting edge of bed, pt donned shoes with increased time and min A. Pt performed squat pivot transfer bed > w/c without removing near arm rest. Pt propelled w/c using RLE 2 x 150 ft with supervision, 1 instance of running into doorway on L side. Pt instructed to perform w/c setup in preparation for transfer, requires increased time and cues for initiation and leg rest management. On mat table, pt practiced scooting around edge of table with focus on WB through BLE and clearing hips instead of shearing. Pt performed LLE NMR with R foot raised on step to encourage increased LLE weightbearing, verbal/tactile cues for anterior weight shift and eccentric control with stand > sit. Pt performed car transfer to sedan height via squat pivot with supervision, verbal cues for safe hand placement and staying low as he hit his head on car door when attempting to stand up all the way. Pt returned to room and left sitting in w/c with quick release belt on and all needs within reach.   Therapy Documentation Precautions:  Precautions Precautions: Fall Precaution Comments: L inattention, brace on L ankle from recent fx Restrictions Weight Bearing Restrictions: No Pain: Pain Assessment Pain Assessment: No/denies pain  See FIM for current functional status  Therapy/Group: Individual Therapy  Kerney ElbeVarner, Arilyn Brierley A 11/19/2014, 2:35 PM

## 2014-11-20 ENCOUNTER — Inpatient Hospital Stay (HOSPITAL_COMMUNITY): Payer: Medicaid Other | Admitting: Occupational Therapy

## 2014-11-20 ENCOUNTER — Inpatient Hospital Stay (HOSPITAL_COMMUNITY): Payer: Self-pay

## 2014-11-20 ENCOUNTER — Inpatient Hospital Stay (HOSPITAL_COMMUNITY): Payer: Self-pay | Admitting: Rehabilitation

## 2014-11-20 ENCOUNTER — Inpatient Hospital Stay (HOSPITAL_COMMUNITY): Payer: Medicaid Other | Admitting: *Deleted

## 2014-11-20 LAB — CREATININE, SERUM
CREATININE: 0.84 mg/dL (ref 0.50–1.35)
GFR calc Af Amer: >90 mL/min (ref >90)
GFR calc non Af Amer: >90 mL/min (ref >90)

## 2014-11-20 NOTE — Progress Notes (Signed)
Occupational Therapy Session Note  Patient Details  Name: Cory SleightStephen Rizo MRN: [id number] Date of Birth: 1961/09/18  Today's Date: 11/20/2014 OT Individual Time: [id number] OT Individual Time Calculation (min): 45 min    Short Term Goals: Week 5:  OT Short Term Goal 1 (Week 5): STGs= LTGs secondary to expected discharge  Skilled Therapeutic Interventions/Progress Updates:  Upon entering room, NT present assisting pt with supervision for breakfast for safety. OT resumed supervision for feeding with pt requiring set up A to obtain banana from peeling and  Min verbal cues for continued completion of task.Supine > sit with supervision transferring to wheelchair with close supervision and max verbal cues for safety awareness and set up. Pt requesting to wash at sink with Min A for bathing. LB bathing with Mod A as well as Mod A for standing balance. Pt requiring assistance to get L hand into stabilizing/weight bearing position on sink side while standing for LB washing and dressing. Pt is able to assist L leg over R knee but once crossed over he is unable to lean forward enough to donn L sock and shoe. Pt seated in wheelchair with QRB donned and call bell within reach upon exiting the room.   Therapy Documentation Precautions:  Precautions Precautions: Fall Precaution Comments: L inattention, brace on L ankle from recent fx Restrictions Weight Bearing Restrictions: No Pain: Pain Assessment Pain Assessment: No/denies pain Pain Score: 0-No pain  See FIM for current functional status  Therapy/Group: Individual Therapy  [name], Tayte Childers L 11/20/2014, 12:25 PM

## 2014-11-20 NOTE — Patient Care Conference (Signed)
Inpatient RehabilitationTeam Conference and Plan of Care Update Date: 11/20/2014   Time: 10:05 AM    Patient Name: Cory [name]      Medical Record Number: [id number]  Date of Birth: 11/09/1961 Sex: Male         Room/Bed: 4M08C/4M08C-01 Payor Info: Payor: MEDICAID PENDING / Plan: MEDICAID PENDING / Product Type: *No Product type* /    Admitting Diagnosis: R MCA INFARCT  Admit Date/Time:  10/18/2014  4:07 PM Admission Comments: No comment available   Primary Diagnosis:  Right middle cerebral artery stroke Principal Problem: Right middle cerebral artery stroke  Patient Active Problem List   Diagnosis Date Noted  . 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 Left-sided neglect 10/23/2014  . Alterations of sensations following CVA (cerebrovascular accident) 10/23/2014  . CVA (cerebral infarction) 10/18/2014  . Right middle cerebral artery stroke   . Dyslipidemia   . Acute CVA (cerebrovascular accident) 10/15/2014  . Stroke 10/15/2014  . Alcoholism 10/15/2014    Expected Discharge Date: Expected Discharge Date:  (SNF)  Team Members Present: Physician leading conference: Dr. Faith RogueZachary Swartz Social Worker Present: Amada JupiterLucy Martika Egler, LCSW Nurse Present: Carlean PurlMaryann Barbour, RN PT Present: Edman CircleAudra Hall, PT;Rebecca Varner, Clearnce HastenPT;Emily Parcell, PT OT Present: Perrin MalteseJames McGuire, Domenic SchwabT;Katie Pittman, OT SLP Present: Jackalyn LombardNicole Page, Paticia StackSLP;Melissa Bowie, SLP PPS Coordinator present : Tora DuckMarie Noel, RN, CRRN     Current Status/Progress Goal Weekly Team Focus  Medical   no changes  see prior  see prior   Bowel/Bladder   Continent of bowel and bladder. LBM 11/19/14  Pt to remain continent of bowel and bladder  Monitor   Swallow/Nutrition/ Hydration   Dys 3, thin liquids; full supervision   min assist with least restrictive diet   use of swallowing strategies    ADL's   S for bed mobility, S for transfers wheelchair <> bed with Max vcs for safety, Mod A for standing balance for  B & D, UB dressing with verbal cues, LB dressing Mod A, Min A bathing  min - mod A overall  L NMR, pt education, safety awareness, L inattention, dynamic standing balance,    Mobility   S bed mobility, S transfers with mod to max verbal cues for safety and awareness during transfers, mod to max for standing balance, max A gait with hemi walker.    S transfers, mod A standing, max A therapy only gait goal  L NMR, attention L, transfers, gait, safety awareness.   Communication   supervision-min assist   supervision   continue to increase vocal intensity   Safety/Cognition/ Behavioral Observations  Min-mod assist   min assist   continue to improve left attention and safety awareness, functional problem solving, attention     Pain   Denies  < 3  Assess and treat for pain q shift and prn   Skin   CDI  CDI  Assess skin q shift      *See Care Plan and progress notes for long and short-term goals.  Barriers to Discharge: no support to provide for current care needs    Possible Resolutions to Barriers:  SNF    Discharge Planning/Teaching Needs:  Continue to search for NH bed, administration is involved.  Pt feel thisis the best for  him and family not stepping up to assist him      Team Discussion:  Able to don shirt today with verbal cues!  Still mod -max safety awareness.  No functional change this week.  SW  still working with administration on SNF bed.  Revisions to Treatment Plan:  None   Continued Need for Acute Rehabilitation Level of Care: The patient requires daily medical management by a physician with specialized training in physical medicine and rehabilitation for the following conditions: Daily direction of a multidisciplinary physical rehabilitation program to ensure safe treatment while eliciting the highest outcome that is of practical value to the patient.: Yes Daily medical management of patient stability for increased activity during participation in an intensive  rehabilitation regime.: Yes Daily analysis of laboratory values and/or radiology reports with any subsequent need for medication adjustment of medical intervention for : Neurological problems;Other  Cory [name] 11/20/2014, 4:16 PM

## 2014-11-20 NOTE — Progress Notes (Signed)
Speech Language Pathology Weekly Progress Note  Patient Details  Name: Cory [name] MRN: [id number] Date of Birth: 1961/05/09  Beginning of progress report period: November 13, 2014 End of progress report period: November 20, 2014   Short Term Goals: Week 5: SLP Short Term Goal 1 (Week 5): Patient will recall and utilize recommended safe swallow strategies with Dys. 3 textures and thin liquids with Min vebral cues to utilize safe swallow strategies to minimize overt s/s of aspiration.  SLP Short Term Goal 1 - Progress (Week 5): Met SLP Short Term Goal 2 (Week 5): Patient will attend to left of environment during functional tasks with Min question cues. SLP Short Term Goal 2 - Progress (Week 5): Progressing toward goal SLP Short Term Goal 3 (Week 5): Patient will demonstrate sustained attention to a basic task for 15 minutes with Min verbal cues for redirection. SLP Short Term Goal 3 - Progress (Week 5): Met SLP Short Term Goal 4 (Week 5): Patient will label 2 physical and 2 cognitive deficits that have resulted from his CVA with Min question cues.  SLP Short Term Goal 4 - Progress (Week 5): Progressing toward goal SLP Short Term Goal 5 (Week 5): Patient will request help as needed duirng functional tasks with Mod question cues.   SLP Short Term Goal 5 - Progress (Week 5): Met    New Short Term Goals: Week 6: SLP Short Term Goal 1 (Week 6): Patient will recall and utilize recommended safe swallow strategies with Dys. 3 textures and thin liquids with supervision verbal cues to utilize safe swallow strategies to minimize overt s/s of aspiration.  SLP Short Term Goal 2 (Week 6): Patient will attend to left of environment during functional tasks with Min question cues. SLP Short Term Goal 3 (Week 6): Patient will label 2 physical and 2 cognitive deficits that have resulted from his CVA with Min question cues.  SLP Short Term Goal 4 (Week 6): Patient will request help as needed during  functional tasks with Min question cues.   SLP Short Term Goal 5 (Week 6): Patient will selectively attend to a basic task for 15-20 minutes with Min verbal cues for redirection.  Weekly Progress Updates:  Pt made slow functional gains this reporting period and has met 3 out of 5 goals due to improved use of swallowing precautions during meals, improved sustained attention, and improved intellectual/emergent awareness of deficits.  Pt currently requires overall min-mod assist for cognitive tasks and full supervision for use of swallowing precautions to minimize overt s/s of aspiration with dys 3 solids and thin liquids.  Pt continues to await SNF placement and would benefit from continued ST follow up while inpatient to maximize functional independence and reduce burden of care upon discharge.  SLP also continues to anticipate that pt will require 24/7 supervision and ST follow up at discharge.      Intensity: Minumum of 1-2 x/day, 30 to 90 minutes Frequency: 5 out of 7 days (QD for therapies ) Duration/Length of Stay: SNF placement Treatment/Interventions: Cognitive remediation/compensation;Cueing hierarchy;Dysphagia/aspiration precaution training;Functional tasks;Environmental controls;Internal/external aids;Medication managment;Patient/family education;Speech/Language facilitation;Therapeutic Activities    Nicolasa Milbrath, Selinda Orion 11/20/2014, 10:46 PM

## 2014-11-20 NOTE — Progress Notes (Signed)
Recreational Therapy Session Note  Patient Details  Name: Cory SleightStephen Hallowell MRN: [id number] Date of Birth: 10/23/1961 Today's Date: 11/20/2014  Pain: no c/o Skilled Therapeutic Interventions/Progress Updates: Session focused on activity tolerance, w/c mobility, safety awareness, selective attention, problem solving & attention to the left.  Pt propelled w/c on unit with supervision, min cues to avoid obstacles on the left.  Pt sorted cards by suit with min cues to attend to the left.  Pt utilized AE(Card rack) to play "tonk" card game with mod cues to attend to left during game play.  Therapy/Group: Individual Therapy Sayvon Arterberry 11/20/2014, 11:18 AM

## 2014-11-20 NOTE — Progress Notes (Signed)
Speech Language Pathology Daily Session Note  Patient Details  Name: Cory [name] MRN: [id number] Date of Birth: 14-Jul-1961  Today's Date: 11/20/2014 SLP Individual Time: [id number] SLP Individual Time Calculation (min): 12 min  Short Term Goals: Week 5: SLP Short Term Goal 1 (Week 5): Patient will recall and utilize recommended safe swallow strategies with Dys. 3 textures and thin liquids with Min vebral cues to utilize safe swallow strategies to minimize overt s/s of aspiration.  SLP Short Term Goal 2 (Week 5): Patient will attend to left of environment during functional tasks with Min question cues. SLP Short Term Goal 3 (Week 5): Patient will demonstrate sustained attention to a basic task for 15 minutes with Min verbal cues for redirection. SLP Short Term Goal 4 (Week 5): Patient will label 2 physical and 2 cognitive deficits that have resulted from his CVA with Min question cues.  SLP Short Term Goal 5 (Week 5): Patient will request help as needed duirng functional tasks with Mod question cues.    Skilled Therapeutic Interventions: Treatment complete targeting dysphagia goals. Patient able to verbalize safe swallowing strategies provided in previous session with min verbal questioning cues and demonstrated carryover into functional self feeding task with min verbal cueing for clearance of left sided buccal pocketing and monitoring for mild left sided anterior labial spillage. No overt s/s of aspiration observed. Patieint verbalized difficulty controlling impulsivity with bite/sip size and rate of po intake with meal tray. Discussed strategies which may assist with decreased rate of intake. Patient instructed to attempt these strategies during lunch and evening meals today. Patient verbalized understanding.    FIM:  Comprehension Comprehension Mode: Auditory Comprehension: 5-Understands complex 90% of the time/Cues < 10% of the time Expression Expression Mode: Verbal Expression:  4-Expresses basic 75 - 89% of the time/requires cueing 10 - 24% of the time. Needs helper to occlude trach/needs to repeat words. Social Interaction Social Interaction: 4-Interacts appropriately 75 - 89% of the time - Needs redirection for appropriate language or to initiate interaction. Problem Solving Problem Solving: 3-Solves basic 50 - 74% of the time/requires cueing 25 - 49% of the time Memory Memory: 4-Recognizes or recalls 75 - 89% of the time/requires cueing 10 - 24% of the time FIM - Eating Eating Activity: 5: Supervision/cues  Pain Pain Assessment Pain Assessment: No/denies pain Pain Score: 0-No pain  Therapy/Group: Individual Therapy   Ferdinand LangoLeah Crystall Donaldson MA, CCC-SLP [phone]   Cory [name] 11/20/2014, 10:30 AM

## 2014-11-20 NOTE — Progress Notes (Signed)
Loop recorder 

## 2014-11-20 NOTE — Progress Notes (Signed)
Physical Therapy Weekly Progress Note  Patient Details  Name: Leonid Manus MRN: [id number] Date of Birth: [birth year]  Beginning of progress report period: November 13, 2014 End of progress report period: November 20, 2014  Today's Date: 11/20/2014 PT Individual Time: 4401-0272 PT Individual Time Calculation (min): 30 min   Pt continues have met 9/10 LTG's and is making very little progress with all aspects of mobility.  Continues to require mod to max verbal cues during transfers and mobility for safe w/c set up and removal of parts.  Continue to feel he will need hands on care 24/7 and recommend SNF level of care.   Patient continues to demonstrate the following deficits: decreased balance, decreased safety, decreased awareness, decreased attention, decreased strength in LUE/LE, and therefore will continue to benefit from skilled PT intervention to enhance overall performance with activity tolerance, balance, postural control, ability to compensate for deficits, functional use of  left upper extremity and left lower extremity, attention, awareness, coordination and knowledge of precautions.  See Patient's Care Plan for progression toward long term goals.  Patient progressing toward long term goals..  Continue plan of care.  Skilled Therapeutic Interventions/Progress Updates:   Pt received sitting in w/c in room, agreeable to therapy session.  Self propelled to therapy gym in controlled environment x 120' at mod I level.  Pt doing better with w/c propulsion using RLE only and doing better avoiding obstacles.  Once in therapy gym, transferred to nustep with mod cues for w/c set up and safety and performing transfer at S level.  Performed 8 mins with LEs only with LLE stabilized on pedal with theraband.  Provided intermittent cues and assist for hip add and continuing to extend through full ROM.  Note that pt still needs cues to relax RUE during task.  Then transferred to w/c and w/c<>mat at S  level.  PT assisted with parts and set up for time.  Focused on standing with use of mirror for midline orientation, upright posture and equal WB through LEs. Worked on stepping and retro stepping RLE in order to increase weight shift and WB through LLE.  Pt with good activation of L quad control and glute activation, however difficulty maintaining during stepping, but overall was better than previous session.  Also note that he did better as there was no one in therapy gym so decreased distractions.  Pt propelled back to room and left in w/c with quick release belt donned and all needs in reach.   Therapy Documentation Precautions:  Precautions Precautions: Fall Precaution Comments: L inattention, brace on L ankle from recent fx Restrictions Weight Bearing Restrictions: No   Vital Signs: Therapy Vitals Temp: 98.6 F (37 C) Temp Source: Oral Pulse Rate: 70 Resp: 20 BP: 108/83 mmHg Oxygen Therapy SpO2: 100 % O2 Device: Not Delivered Pain: Pt with no c/o pain during session.   See FIM for current functional status  Therapy/Group: Individual Therapy  Denice Bors 11/20/2014, 8:08 AM

## 2014-11-21 ENCOUNTER — Inpatient Hospital Stay (HOSPITAL_COMMUNITY): Payer: Medicaid Other | Admitting: Occupational Therapy

## 2014-11-21 ENCOUNTER — Encounter: Payer: Self-pay | Admitting: Cardiology

## 2014-11-21 ENCOUNTER — Telehealth: Payer: Self-pay | Admitting: Cardiology

## 2014-11-21 ENCOUNTER — Inpatient Hospital Stay (HOSPITAL_COMMUNITY): Payer: Medicaid Other | Admitting: *Deleted

## 2014-11-21 ENCOUNTER — Inpatient Hospital Stay (HOSPITAL_COMMUNITY): Payer: Self-pay

## 2014-11-21 NOTE — Progress Notes (Signed)
Occupational Therapy Session Note  Patient Details  Name: Cory [name] MRN: [id number] Date of Birth: 05-27-1961  Today's Date: 11/21/2014 OT Individual Time: [id number] OT Individual Time Calculation (min): 60 min    Short Term Goals: Week 5:  OT Short Term Goal 1 (Week 5): STGs= LTGs secondary to expected discharge  Skilled Therapeutic Interventions/Progress Updates:  Upon entering the room, pt seated in bed finishing breakfast with OT resuming responsibility for full supervision during meal in order to finish task. OT session with focus on self care, functional transfer, standing balance, L NMR, L inattention, and safety awareness. Pt performed supine >sit with supervision and  mod multimodal cues for safety and awareness. OT assisted pt to shower via wheelchair. Pt performing tub transfer from wheelchair <> TTB with supervision as well. Pt required min-mod verbal guidance cues for sequencing and initiation as pt perseverating on washing and rinsing R LE this session. OT continues to educate pt on utilizing L UE for washing tasks with [facility] assist from R hand. Pt performed dressing and grooming at sink side. Standing balance with Mod A for weight shifting and weight bearing onto L hand to stabilize self during clothing management. UB dressing with set up A and min verbal cues for technique. Pt seated in wheelchair with QRB and arm tray donned. Call bell and all other items within reach upon exiting the room.   Therapy Documentation Precautions:  Precautions Precautions: Fall Precaution Comments: L inattention, brace on L ankle from recent fx Restrictions Weight Bearing Restrictions: No Pain: Pain Assessment Pain Assessment: No/denies pain  See FIM for current functional status  Therapy/Group: Individual Therapy  [name], [name] 11/21/2014, 12:31 PM

## 2014-11-21 NOTE — Telephone Encounter (Signed)
Attempted to call pt and inform pt to send manual transmission. No answer and unable to leave a message.

## 2014-11-21 NOTE — Progress Notes (Signed)
[place] PHYSICAL MEDICINE & REHABILITATION     PROGRESS NOTE    Subjective/Complaints: No new issues. Up with therapy in the bathroom today   ROS limited due to mental status/affect  Objective: Vital Signs: Blood pressure 130/74, pulse 64, temperature 97.3 F (36.3 C), temperature source Oral, resp. rate 20, weight 76.204 kg (168 lb), SpO2 100 %. No results found. No results for input(s): WBC, HGB, HCT, PLT in the last 72 hours.  Recent Labs  11/20/14 0601  CREATININE 0.84   CBG (last 3)  No results for input(s): GLUCAP in the last 72 hours.  Wt Readings from Last 3 Encounters:  11/13/14 76.204 kg (168 lb)  10/15/14 79.289 kg (174 lb 12.8 oz)  09/03/14 86.183 kg (190 lb)    Physical Exam:  Constitutional: He is oriented to person, place, and time. He appears well-developed and well-nourished.  HENT:  Head: Normocephalic and atraumatic.  Eyes: Conjunctivae are normal. Pupils are equal, round, and reactive to light.   Neck: Normal range of motion. Neck supple.  Cardiovascular: Normal rate and regular rhythm.  Respiratory: Effort normal and breath sounds normal. No respiratory distress. He has no wheezes.  GI: Soft. Bowel sounds are normal.  Musculoskeletal: He exhibits mild pain with Left hip Int/ext rotation Neurological: He is alert and oriented to person, place, and time. Delayed processing.  Left facial weakness with mild dysarthria and left inattention.   Has poor insight and awareness of deficits and easily distracted . Motor strength 0/5 in left deltoid, biceps, triceps, 0/5 to?tr grip, hip flexor, 2- knee extensor, 0 ankle dorsiflexion plantar flexion MAS 1 Left hamstrings, MAS 3 in L Hip Add, MAS 2 in Quad 5/5 on the right side in the same muscle groups.  Absent sensation in the left upper and left lower extremity.  Visual fields absent to confrontation testing on left side  Skin: Skin is warm and dry.    Assessment/Plan: 1. Functional deficits secondary  to right MCA infarct, ?thrombotic, which require 3+ hours per day of interdisciplinary therapy in a comprehensive inpatient rehab setting. Physiatrist is providing close team supervision and 24 hour management of active medical problems listed below. Physiatrist and rehab team continue to assess barriers to discharge/monitor patient progress toward functional and medical goals.  SNF pending  FIM: FIM - Bathing Bathing Steps Patient Completed: Chest, Left Arm, Abdomen, Front perineal area, Buttocks, Right upper leg, Left upper leg, Right lower leg (including foot) Bathing: 4: Min-Patient completes 8-9 4332f 10 parts or 75+ percent  FIM - Upper Body Dressing/Undressing Upper body dressing/undressing steps patient completed: Thread/unthread right sleeve of pullover shirt/dresss, Thread/unthread left sleeve of pullover shirt/dress, Put head through opening of pull over shirt/dress, Pull shirt over trunk Upper body dressing/undressing: 5: Supervision: Safety issues/verbal cues FIM - Lower Body Dressing/Undressing Lower body dressing/undressing steps patient completed: Thread/unthread right underwear leg, Thread/unthread left underwear leg, Thread/unthread right pants leg, Thread/unthread left pants leg, Don/Doff left sock, Don/Doff right shoe Lower body dressing/undressing: 3: Mod-Patient completed 50-74% of tasks  FIM - Toileting Toileting steps completed by patient: Performs perineal hygiene Toileting Assistive Devices: Grab bar or rail for support Toileting: 2: Max-Patient completed 1 of 3 steps  FIM - Diplomatic Services operational officerToilet Transfers Toilet Transfers Assistive Devices: Grab bars Toilet Transfers: 4-To toilet/BSC: Min A (steadying Pt. > 75%), 4-From toilet/BSC: Min A (steadying Pt. > 75%)  FIM - Bed/Chair Transfer Bed/Chair Transfer Assistive Devices: Arm rests Bed/Chair Transfer: 5: Bed > Chair or W/C: Supervision (verbal cues/safety issues), 5:  Chair or W/C > Bed: Supervision (verbal cues/safety issues),  5: Supine > Sit: Supervision (verbal cues/safety issues)  FIM - Locomotion: Wheelchair Distance: 150 Locomotion: Wheelchair: 5: Travels 150 ft or more: maneuvers on rugs and over door sills with supervision, cueing or coaxing FIM - Locomotion: Ambulation Locomotion: Ambulation Assistive Devices: Orthosis, Environmental consultantWalker - Hemi Ambulation/Gait Assistance: 2: Max assist Locomotion: Ambulation: 0: Activity did not occur  Comprehension Comprehension Mode: Auditory Comprehension: 5-Understands complex 90% of the time/Cues < 10% of the time  Expression Expression Mode: Verbal Expression Assistive Devices: 6-Talk trach valve Expression: 4-Expresses basic 75 - 89% of the time/requires cueing 10 - 24% of the time. Needs helper to occlude trach/needs to repeat words.  Social Interaction Social Interaction Mode: Asleep Social Interaction: 4-Interacts appropriately 75 - 89% of the time - Needs redirection for appropriate language or to initiate interaction.  Problem Solving Problem Solving Mode: Not assessed Problem Solving: 3-Solves basic 50 - 74% of the time/requires cueing 25 - 49% of the time  Memory Memory Mode: Asleep Memory Assistive Devices: Memory book Memory: 4-Recognizes or recalls 75 - 89% of the time/requires cueing 10 - 24% of the time  Medical Problem List and Plan: 1. Functional deficits secondary to R-MCA infarct question embolic v/s thrombotic with resultant left hemiplegia with sensory deficits, left facial weakness, left neglect and dysphagia. 2. DVT Prophylaxis/Anticoagulation: Pharmaceutical: Lovenox 3. Pain Management: tylenol for h/a for now.  topamax for post stroke headache, improved will wean 4. Mood: Seems withdrawn--will monitor for now. LCSW to follow for support and evaluation.  5. Neuropsych: This patient is not capable of making decisions on his own behalf. 6. Skin/Wound Care: Routine pressure relief measures.  7. Fluids/Electrolytes/Nutrition: Monitor I/O.  Follow up admission labs tomorrow 8. HTN: 9. H/o Alcohol abuse/Tobacco use: May d/c CIWA protocol. No signs of WD for several weeks 10. Dyslipidemia: 11.  Mild HyperK--resolved 12.  Hx subacute ankle fx (9-10wks wks ago) recheck xrays, healing oblique fibular fx , non displaced, will need either ankle splint or AFO when OOB, f/u ortho (Dr Ophelia CharterYates) as outpt 13.  Spasticity increasing primarily L hamstring, start zanaflex, monitor BP,  Botulinum to Hamstrings helpful , also develping Adductor and quad  spasticity       LOS (Days) 34 A FACE TO FACE EVALUATION WAS PERFORMED  Cory [name] 11/21/2014 8:45 AM

## 2014-11-21 NOTE — Progress Notes (Signed)
Social Work Patient ID: Cory SleightStephen [name], male   DOB: July 23, 1961, 53 y.o.   MRN: [id number] Updated pt on team conference and goals.  Still looking for a NH bed and availability.  Sister and brother earlier in the week. Have never heard back from pt's girlfriend and attempted to contact multiple times.

## 2014-11-21 NOTE — Progress Notes (Signed)
Physical Therapy Note  Patient Details  Name: Cory SleightStephen Ellefson MRN: [id number] Date of Birth: 07-23-1961 Today's Date: 11/21/2014  Patient missed 30 minutes of skilled physical therapy secondary to refusal to participate. Upon entering, patient asleep in bed. After waking patient, patient refusing to get OOB. Attempted to motivate patient to get out of bed to attend patient new year's eve party, but patient continues to refuse. Will follow up as able.   Zella RicherBridget S Virginia Curl S. Erik Nessel, PT, DPT 11/21/2014, 4:09 PM

## 2014-11-21 NOTE — Progress Notes (Signed)
Occupational Therapy Weekly Progress Note and treatment session  Patient Details  Name: Cory [name] MRN: [id number] Date of Birth: 08-04-1961  Beginning of progress report period: November 14, 2014 End of progress report period: November 22, 2014  Today's Date: 11/22/2014 OT Individual Time: 1430-1530 OT Individual Time Calculation (min): 60 min    Patient has met  8 of 12 long term goals and is making minimal progress towards remaining goals. Pt has increased tone in L UE and continues to require mod- max verbal cues for safety during self care and functional transfers. Pt would benefit from SNF placement secondary to level of assist and 24/7 care.  Patient continues to demonstrate the following deficits: decreased I in self care, decreased safety awareness, decreased functional use of L UE, decreased strength in L UE and L LE, decreased awareness, decreased balance and therefore will continue to benefit from skilled OT intervention to enhance overall performance with BADL.  See Patient's Care Plan for progression toward long term goals.  Patient progressing toward long term goals..  Continue plan of care.  Skilled Therapeutic Interventions/Progress Updates:  Upon entering room, pt seated in wheelchair awaiting therapist arrival with no c/o pain. Skilled OT session with focus on STS, functional transfers, self care - bathing at shower level, pt education, L UE NMR. OT introduced LH sponge to assist pt with washing R arm and B feet. Bath mitt also presented with therapist assist to donn and doff onto L hand in order to continue to incorporate L UE into bathing tasks with R hand assisting. Transfer from wheelchair <> TTB with Min A squat pivot this session. Dressing performed seated in wheelchair at sink side with L hand placed on sink side for weight bearing while standing to complete LB clothing management. Pt not quite able to donn sock on L foot while it is on stool or with crossover  technique. Pt seated in wheelchair with QRB donned and call bell within reach upon exiting the room.   Therapy Documentation Precautions:  Precautions Precautions: Fall Precaution Comments: L inattention, brace on L ankle from recent fx Restrictions Weight Bearing Restrictions: No  See FIM for current functional status  Therapy/Group: Individual Therapy  Phineas Semen 11/21/2014, 9:00 PM

## 2014-11-21 NOTE — Progress Notes (Signed)
Speech Language Pathology Daily Session Note  Patient Details  Name: Cory [name] MRN: [id number] Date of Birth: 1961-09-16  Today's Date: 11/21/2014 SLP Individual Time: 1000-1030 SLP Individual Time Calculation (min): 30 min  Short Term Goals: Week 6: SLP Short Term Goal 1 (Week 6): Patient will recall and utilize recommended safe swallow strategies with Dys. 3 textures and thin liquids with supervision verbal cues to utilize safe swallow strategies to minimize overt s/s of aspiration.  SLP Short Term Goal 2 (Week 6): Patient will attend to left of environment during functional tasks with Min question cues. SLP Short Term Goal 3 (Week 6): Patient will label 2 physical and 2 cognitive deficits that have resulted from his CVA with Min question cues.  SLP Short Term Goal 4 (Week 6): Patient will request help as needed during functional tasks with Min question cues.   SLP Short Term Goal 5 (Week 6): Patient will selectively attend to a basic task for 15-20 minutes with Min verbal cues for redirection.  Skilled Therapeutic Interventions: Treatment focused on cognitive goals. SLP facilitated session with Min cues for recall of safe swallowing strategies as well as previous therapy activities and goals. Pt completed 6-step sequencing task with Mod faded to Min multimodal cueing. Continue plan of care.   FIM:  Comprehension Comprehension Mode: Auditory Comprehension: 5-Follows basic conversation/direction: With extra time/assistive device Expression Expression Mode: Verbal Expression: 4-Expresses basic 75 - 89% of the time/requires cueing 10 - 24% of the time. Needs helper to occlude trach/needs to repeat words. Social Interaction Social Interaction: 4-Interacts appropriately 75 - 89% of the time - Needs redirection for appropriate language or to initiate interaction. Problem Solving Problem Solving: 3-Solves basic 50 - 74% of the time/requires cueing 25 - 49% of the time Memory Memory:  4-Recognizes or recalls 75 - 89% of the time/requires cueing 10 - 24% of the time FIM - Eating Eating Activity: 5: Needs verbal cues/supervision  Pain Pain Assessment Pain Assessment: No/denies pain  Therapy/Group: Individual Therapy   Cory [name], Cory [name] [phone]  Cory [name], Korde Jeppsen 11/21/2014, 12:11 PM

## 2014-11-22 ENCOUNTER — Inpatient Hospital Stay (HOSPITAL_COMMUNITY): Payer: Medicaid Other | Admitting: Occupational Therapy

## 2014-11-22 NOTE — Progress Notes (Signed)
[place] PHYSICAL MEDICINE & REHABILITATION     PROGRESS NOTE    Subjective/Complaints: No new issues.   ROS limited due to mental status/affect  Objective: Vital Signs: Blood pressure 110/74, pulse 61, temperature 98.7 F (37.1 C), temperature source Oral, resp. rate 20, weight 76.204 kg (168 lb), SpO2 100 %. No results found. No results for input(s): WBC, HGB, HCT, PLT in the last 72 hours.  Recent Labs  11/20/14 0601  CREATININE 0.84   CBG (last 3)  No results for input(s): GLUCAP in the last 72 hours.  Wt Readings from Last 3 Encounters:  11/13/14 76.204 kg (168 lb)  10/15/14 79.289 kg (174 lb 12.8 oz)  09/03/14 86.183 kg (190 lb)    Physical Exam:  Constitutional: He is oriented to person, place, and time. He appears well-developed and well-nourished.  HENT:  Head: Normocephalic and atraumatic.  Eyes: Conjunctivae are normal. Pupils are equal, round, and reactive to light.   Neck: Normal range of motion. Neck supple.  Cardiovascular: Normal rate and regular rhythm.  Respiratory: Effort normal and breath sounds normal. No respiratory distress. He has no wheezes.  GI: Soft. Bowel sounds are normal.  Musculoskeletal: He exhibits mild pain with Left hip Int/ext rotation Neurological: He is alert and oriented to person, place, and time. Delayed processing.  Left facial weakness with mild dysarthria and left inattention.   Has poor insight and awareness of deficits and easily distracted . Motor strength 0/5 in left deltoid, biceps, triceps, 0/5 to?tr grip, hip flexor, 2- knee extensor, 0 ankle dorsiflexion plantar flexion MAS 1 Left hamstrings, MAS 3 in L Hip Add, MAS 2 in Quad 5/5 on the right side in the same muscle groups.  Absent sensation in the left upper and left lower extremity.  Visual fields absent to confrontation testing on left side  Skin: Skin is warm and dry.    Assessment/Plan: 1. Functional deficits secondary to right MCA infarct, ?thrombotic,  which require 3+ hours per day of interdisciplinary therapy in a comprehensive inpatient rehab setting. Physiatrist is providing close team supervision and 24 hour management of active medical problems listed below. Physiatrist and rehab team continue to assess barriers to discharge/monitor patient progress toward functional and medical goals.  SNF pending  FIM: FIM - Bathing Bathing Steps Patient Completed: Chest, Left Arm, Front perineal area, Abdomen, Buttocks, Left upper leg, Right lower leg (including foot), Left lower leg (including foot) Bathing: 4: Min-Patient completes 8-9 30f 10 parts or 75+ percent  FIM - Upper Body Dressing/Undressing Upper body dressing/undressing steps patient completed: Thread/unthread right sleeve of pullover shirt/dresss, Thread/unthread left sleeve of pullover shirt/dress, Put head through opening of pull over shirt/dress, Pull shirt over trunk Upper body dressing/undressing: 5: Set-up assist to: Obtain clothing/put away FIM - Lower Body Dressing/Undressing Lower body dressing/undressing steps patient completed: Thread/unthread right underwear leg, Thread/unthread left underwear leg, Thread/unthread right pants leg, Thread/unthread left pants leg, Pull pants up/down, Don/Doff right sock, Don/Doff right shoe Lower body dressing/undressing: 3: Mod-Patient completed 50-74% of tasks  FIM - Toileting Toileting steps completed by patient: Performs perineal hygiene Toileting Assistive Devices: Grab bar or rail for support Toileting: 2: Max-Patient completed 1 of 3 steps  FIM - Diplomatic Services operational officer Devices: Grab bars Toilet Transfers: 4-To toilet/BSC: Min A (steadying Pt. > 75%), 4-From toilet/BSC: Min A (steadying Pt. > 75%)  FIM - Bed/Chair Transfer Bed/Chair Transfer Assistive Devices: Arm rests Bed/Chair Transfer: 0: Activity did not occur  FIM - Locomotion: Wheelchair Distance:  150 Locomotion: Wheelchair: 0: Activity did not  occur FIM - Locomotion: Ambulation Locomotion: Ambulation Assistive Devices: Orthosis, Walker - Hemi Ambulation/Gait Assistance: Not tested (comment) Locomotion: Ambulation: 0: Activity did not occur  Comprehension Comprehension Mode: Auditory Comprehension: 5-Understands complex 90% of the time/Cues < 10% of the time  Expression Expression Mode: Verbal Expression Assistive Devices: 6-Talk trach valve Expression: 4-Expresses basic 75 - 89% of the time/requires cueing 10 - 24% of the time. Needs helper to occlude trach/needs to repeat words.  Social Interaction Social Interaction Mode: Asleep Social Interaction: 4-Interacts appropriately 75 - 89% of the time - Needs redirection for appropriate language or to initiate interaction.  Problem Solving Problem Solving Mode: Not assessed Problem Solving: 3-Solves basic 50 - 74% of the time/requires cueing 25 - 49% of the time  Memory Memory Mode: Asleep Memory Assistive Devices: Memory book Memory: 4-Recognizes or recalls 75 - 89% of the time/requires cueing 10 - 24% of the time  Medical Problem List and Plan: 1. Functional deficits secondary to R-MCA infarct question embolic v/s thrombotic with resultant left hemiplegia with sensory deficits, left facial weakness, left neglect and dysphagia. 2. DVT Prophylaxis/Anticoagulation: Pharmaceutical: Lovenox 3. Pain Management: tylenol for h/a for now.  topamax for post stroke headache, improved will wean 4. Mood: Seems withdrawn--will monitor for now. LCSW to follow for support and evaluation.  5. Neuropsych: This patient is not capable of making decisions on his own behalf. 6. Skin/Wound Care: Routine pressure relief measures.  7. Fluids/Electrolytes/Nutrition: Monitor I/O. Follow up admission labs tomorrow 8. HTN: 9. H/o Alcohol abuse/Tobacco use: May d/c CIWA protocol. No signs of WD for several weeks 10. Dyslipidemia: 11.  Mild HyperK--resolved 12.  Hx subacute ankle fx (9-10wks  wks ago) recheck xrays, healing oblique fibular fx , non displaced, will need either ankle splint or AFO when OOB, f/u ortho (Dr Ophelia Charter) as outpt 13.  Spasticity increasing primarily L hamstring, start zanaflex, monitor BP,  Botulinum to Hamstrings helpful , also develping Adductor and quad  spasticity       LOS (Days) 35 A FACE TO FACE EVALUATION WAS PERFORMED  Jamille Fisher T 11/22/2014 7:52 AM

## 2014-11-23 ENCOUNTER — Inpatient Hospital Stay (HOSPITAL_COMMUNITY): Payer: Medicaid Other | Admitting: Occupational Therapy

## 2014-11-23 ENCOUNTER — Inpatient Hospital Stay (HOSPITAL_COMMUNITY): Payer: Medicaid Other

## 2014-11-23 NOTE — Progress Notes (Addendum)
Physical Therapy Session Note  Patient Details  Name: Deakin Lacek MRN: [id number] Date of Birth: [birth year]  Today's Date: 11/23/2014 PT Individual Time: 1350-1450 PT Individual Time Calculation (min): 60 min   Short Term Goals: Week 3:  PT Short Term Goal 1 (Week 3): =LTG's due to ELOS  Skilled Therapeutic Interventions/Progress Updates:  1:1. Pt received sitting in w/c, ready for therapy. Denied pain, Focus this session on functional endurance, functional transfers and w/c propulsion as well as NMR. Pt req overall distant supervision for w/c propulsion 150-75'x4 with R LE only. Pt req min cues for obstacle negotiation due to R inattention.   Emphasis on set up of squat pivot t/f as well as management of w/c parts for t/f w/c<>car, w/c<>couch and w/c<>NuStep. Pt req overall max cues for set up and management of parts and (S)-min A for safe completion of t/f due to decreased attention to positioning of L LE.   In standing with use of mirror for visual feedback and rail for R UE support, focus on midline orientation first in static standing then progressing to dynamic weight shifts and mini squats 2x10 reps. Despite visual feedback and verbal cues, therapist having to consistently manually facilitating increased weight shift to L side for more even WB and use of L LE. Pt utilized NuStep to target B LE strength and coordination, good tolerance to level 1x62min. Pt req intermittent manual facilitation for increased knee add as well as to fully extend R LE. Pt verbally cued for increased pace to generate more fluid, reciprocal motion to mimic that required by ambulation.   Pt left sitting in w/c at end of session w/ all needs in reach, quick release belt in place.    Therapy Documentation Precautions:  Precautions Precautions: Fall Precaution Comments: L inattention, brace on L ankle from recent fx Restrictions Weight Bearing Restrictions: No   Pain: Pain Assessment Pain Assessment:  No/denies pain  See FIM for current functional status  Therapy/Group: Individual Therapy  Denzil Hughes 11/23/2014, 3:01 PM

## 2014-11-23 NOTE — Progress Notes (Signed)
Occupational Therapy Session Note  Patient Details  Name: Cory [name] MRN: [id number] Date of Birth: 1961/03/21  Today's Date: 11/23/2014 OT Individual Time: 4098-1191 and 4782-9562 OT Individual Time Calculation (min): 60 min and 30 min    Short Term Goals: Week 6:  OT Short Term Goal 1 (Week 6): short term goals = LTGs secondary to anticipated upcoming discharge  Skilled Therapeutic Interventions/Progress Updates:  Session 1: Upon entering room, pt seated in wheelchair with no c/o pain. Skilled OT session with focus on self care, functional transfers, STS, L inattention, use of AE, education, and safety awareness. Bathing completed at shower level with Min A and use of bath mitt on L hand and LH sponge this session. Pt continues to perseverate on washing R LE when and requires multiple verbal cues throughout bathing to wash other parts. Squat pivot transfer wheelchair <> TTB with min A for safety. Pt engaging in lateral leans with steady assist in order to wash buttocks. Dressing performed in wheelchair at sink side. Pt finally able to donn L sock with assistance to hold L leg crossed over R leg with one handed technique. Pt seated in wheelchair with QRB donned for safety upon exiting the room.   Session 2: Upon entering the room, pt seated in wheelchair with no c/o pain. Pt with increased flexor tone with therapist performing 10 reps of PROM in all planes to initially stretch. Pt with c/o pain at end points for shoulder flexion, shoulder abduction, and external rotation. Pt demonstrated ability to perform self ROM HEP with use of paper handout with min verbal cues for proper technique. Pt able to perform shoulder,elbow, and wrist without difficulty but required vcs for hand exercises. Pt performing 1 set of 10 of each exercise. OT recommended that pt perform 2 sets of 10 each day. Pt remained in wheelchair with QRB donned and visitors entering the room as therapist left.  Therapy  Documentation Precautions:  Precautions Precautions: Fall Precaution Comments: L inattention, brace on L ankle from recent fx Restrictions Weight Bearing Restrictions: No  See FIM for current functional status  Therapy/Group: Individual Therapy  Lowella Grip 11/23/2014, 8:20 AM

## 2014-11-24 ENCOUNTER — Inpatient Hospital Stay (HOSPITAL_COMMUNITY): Payer: Medicaid Other | Admitting: Occupational Therapy

## 2014-11-24 ENCOUNTER — Inpatient Hospital Stay (HOSPITAL_COMMUNITY): Payer: Medicaid Other

## 2014-11-24 NOTE — Progress Notes (Signed)
Occupational Therapy Session Note  Patient Details  Name: Cory [name] MRN: [id number] Date of Birth: 05-05-1961  Today's Date: 11/24/2014 OT Individual Time: 1140-1210 OT Individual Time Calculation (min): 30 min    Skilled Therapeutic Interventions/Progress Updates: Heat applied to tight L SITS muscles, followed by self ROM and neur education by this clincian with patient sitting EOB.   Patient also participated in bed mobility and repositioning with min A.     Therapy Documentation Precautions:  Precautions Precautions: Fall Precaution Comments: L inattention, brace on L ankle from recent fx Restrictions Weight Bearing Restrictions: No  Pain: not rated but complained of sore pain in L middle deltoid and upper trapezius (neck out to shoulder) during ROM and neuro reducation.   He did voice need for pain meds as he indicated the pain was not present today except for during PROM, AROM, or AAROM.    See FIM for current functional status  Therapy/Group: Individual Therapy  Bud Face [facility] 11/24/2014, 5:00 PM

## 2014-11-24 NOTE — Progress Notes (Signed)
[facility] [facility] PHYSICAL MEDICINE & REHABILITATION     PROGRESS NOTE    Subjective/Complaints: No problems overnight. Appears comfortable   ROS limited due to mental status/affect  Objective: Vital Signs: Blood pressure 115/64, pulse 75, temperature 98.1 F (36.7 C), temperature source Oral, resp. rate 18, weight 76.204 kg (168 lb), SpO2 99 %. No results found. No results for input(s): WBC, HGB, HCT, PLT in the last 72 hours. No results for input(s): NA, K, CL, GLUCOSE, BUN, CREATININE, CALCIUM in the last 72 hours.  Invalid input(s): CO CBG (last 3)  No results for input(s): GLUCAP in the last 72 hours.  Wt Readings from Last 3 Encounters:  11/13/14 76.204 kg (168 lb)  10/15/14 79.289 kg (174 lb 12.8 oz)  09/03/14 86.183 kg (190 lb)    Physical Exam:  Constitutional: He is oriented to person, place, and time. He appears well-developed and well-nourished.  HENT:  Head: Normocephalic and atraumatic.  Eyes: Conjunctivae are normal. Pupils are equal, round, and reactive to light.   Neck: Normal range of motion. Neck supple.  Cardiovascular: Normal rate and regular rhythm.  Respiratory: Effort normal and breath sounds normal. No respiratory distress. He has no wheezes.  GI: Soft. Bowel sounds are normal.  Musculoskeletal: He exhibits mild pain with Left hip Int/ext rotation Neurological: He is alert and oriented to person, place, and time. Delayed processing.  Left facial weakness with dysarthria and left inattention.   Has poor insight and awareness of deficits and easily distracted . Motor strength 0/5 in left deltoid, biceps, triceps, 0/5 to?tr grip, hip flexor, 2- knee extensor, 0 ankle dorsiflexion plantar flexion MAS 1 Left hamstrings, MAS 3 in L Hip Add, MAS 1-2 in Quad 5/5 on the right side in the same muscle groups.  Absent sensation in the left upper and left lower extremity.  Visual fields absent to confrontation testing on left side  Skin: Skin is warm and dry.     Assessment/Plan: 1. Functional deficits secondary to right MCA infarct, ?thrombotic, which require 3+ hours per day of interdisciplinary therapy in a comprehensive inpatient rehab setting. Physiatrist is providing close team supervision and 24 hour management of active medical problems listed below. Physiatrist and rehab team continue to assess barriers to discharge/monitor patient progress toward functional and medical goals.  SNF pending  FIM: FIM - Bathing Bathing Steps Patient Completed: Chest, Left Arm, Front perineal area, Abdomen, Buttocks, Left upper leg, Right lower leg (including foot), Left lower leg (including foot), Right upper leg Bathing: 4: Min-Patient completes 8-9 17f 10 parts or 75+ percent  FIM - Upper Body Dressing/Undressing Upper body dressing/undressing steps patient completed: Thread/unthread right sleeve of pullover shirt/dresss, Thread/unthread left sleeve of pullover shirt/dress, Put head through opening of pull over shirt/dress, Pull shirt over trunk Upper body dressing/undressing: 5: Set-up assist to: Obtain clothing/put away FIM - Lower Body Dressing/Undressing Lower body dressing/undressing steps patient completed: Thread/unthread right underwear leg, Thread/unthread left underwear leg, Thread/unthread right pants leg, Thread/unthread left pants leg, Pull pants up/down, Don/Doff right sock, Don/Doff right shoe Lower body dressing/undressing: 3: Mod-Patient completed 50-74% of tasks  FIM - Toileting Toileting steps completed by patient: Performs perineal hygiene Toileting Assistive Devices: Grab bar or rail for support Toileting: 2: Max-Patient completed 1 of 3 steps (assist with clothing )  FIM - Diplomatic Services operational officer Devices: Grab bars Toilet Transfers: 4-To toilet/BSC: Min A (steadying Pt. > 75%), 4-From toilet/BSC: Min A (steadying Pt. > 75%)  FIM - Press photographer  Assistive Devices: Arm rests Bed/Chair  Transfer: 4: Bed > Chair or W/C: Min A (steadying Pt. > 75%), 4: Chair or W/C > Bed: Min A (steadying Pt. > 75%)  FIM - Locomotion: Wheelchair Distance: 150 Locomotion: Wheelchair: 5: Travels 150 ft or more: maneuvers on rugs and over door sills with supervision, cueing or coaxing FIM - Locomotion: Ambulation Locomotion: Ambulation Assistive Devices: Orthosis, Environmental consultant - Hemi Ambulation/Gait Assistance: Not tested (comment) Locomotion: Ambulation: 0: Activity did not occur  Comprehension Comprehension Mode: Auditory Comprehension: 5-Follows basic conversation/direction: With no assist  Expression Expression Mode: Verbal Expression Assistive Devices: 6-Talk trach valve Expression: 4-Expresses basic 75 - 89% of the time/requires cueing 10 - 24% of the time. Needs helper to occlude trach/needs to repeat words.  Social Interaction Social Interaction Mode: Asleep Social Interaction: 4-Interacts appropriately 75 - 89% of the time - Needs redirection for appropriate language or to initiate interaction.  Problem Solving Problem Solving Mode: Not assessed Problem Solving: 3-Solves basic 50 - 74% of the time/requires cueing 25 - 49% of the time  Memory Memory Mode: Asleep Memory Assistive Devices: Memory book Memory: 4-Recognizes or recalls 75 - 89% of the time/requires cueing 10 - 24% of the time  Medical Problem List and Plan: 1. Functional deficits secondary to R-MCA infarct question embolic v/s thrombotic with resultant left hemiplegia with sensory deficits, left facial weakness, left neglect and dysphagia. 2. DVT Prophylaxis/Anticoagulation: Pharmaceutical: Lovenox 3. Pain Management: tylenol for h/a for now.  topamax for post stroke headache, improved will wean 4. Mood: Seems withdrawn--will monitor for now. LCSW to follow for support and evaluation.  5. Neuropsych: This patient is not capable of making decisions on his own behalf. 6. Skin/Wound Care: Routine pressure relief  measures.  7. Fluids/Electrolytes/Nutrition: Monitor I/O. Follow up admission labs tomorrow 8. HTN: 9. H/o Alcohol abuse/Tobacco use: May d/c CIWA protocol. No signs of WD for several weeks 10. Dyslipidemia: 11.  Mild HyperK--resolved 12.  Hx subacute ankle fx (9-10wks wks ago) recheck xrays, healing oblique fibular fx , non displaced, will need either ankle splint or AFO when OOB, f/u ortho (Dr Ophelia Charter) as outpt 13.  Spasticity increasing primarily L hamstring, start zanaflex, monitor BP,  Botulinum to Hamstrings helpful , also develping Adductor and quad  spasticity       LOS (Days) 37 A FACE TO FACE EVALUATION WAS PERFORMED  SWARTZ,ZACHARY T 11/24/2014 8:05 AM

## 2014-11-24 NOTE — Progress Notes (Addendum)
Occupational Therapy Session Note  Patient Details  Name: Cory [name] MRN: [id number] Date of Birth: 1961/08/19  Today's Date: 11/24/2014 OT Individual Time:  - 845-945 Total minutes=60          Skilled Therapeutic Interventions/Progress Updates:   skilled OT to address neurological cognitivite and UE and LE deficits as they affect his self care and transfer status.   Overall focus during today's am ADL session as follows:  Address postural control during static and dynamic standing for self care.   Patient tended to lean right and neglect side of body and thereby with decreased L LE weight bearing.    Required moderate HOH L UE assist to wash right side of body.   Patient remarkably able to complete most of lower body dressing though he did thread both legs into his left underwear leg.   He recognized the error.  Therapy Documentation Precautions:  Precautions Precautions: Fall Precaution Comments: L inattention, brace on L ankle from recent fx Restrictions Weight Bearing Restrictions: No  Pain:denied during session today    See FIM for current functional status  Therapy/Group: Individual Therapy  Bud Face [facility] 11/24/2014, 4:16 PM

## 2014-11-24 NOTE — Progress Notes (Signed)
Physical Therapy Session Note  Patient Details  Name: Ramesh Moan MRN: [id number] Date of Birth: [birth year]  Today's Date: 11/24/2014 PT Individual Time: 4098-1191 PT Individual Time Calculation (min): 60 min   Short Term Goals: Week 4:  PT Short Term Goal 1 (Week 4): Continue to address LTG's  Skilled Therapeutic Interventions/Progress Updates:   Session focused on functional w/c mobility at overall S level (intermittent cueing for obstacle negotiation), furniture transfers in ADL apartment with focus on w/c parts management and set up for transfer with overall S (min to S verbal cues needed), therapeutic activity for gait training in parallel bars (forward and retro gait) and then with hemiwalker in hallway with min to mod A x 30' x 2 trials with cues for increased WB to LLE and upright posture, manual facilitation at L quad and for weightshift, and mirror for visual feedback, and neuro re-ed for sit to stands (with bias to L side for increased use of LLE) and weighshifting in standing to LLE with terminal knee extension to activate quad. Trial with AFO on LLE during second gait trial to improve stability at knee and swing through of LLE during gait pattern. Returned back to room with safety belt in place and all needs in reach.   Therapy Documentation Precautions:  Precautions Precautions: Fall Precaution Comments: L inattention, brace on L ankle from recent fx Restrictions Weight Bearing Restrictions: No  Pain: Pain Assessment Pain Assessment: No/denies pain  See FIM for current functional status  Therapy/Group: Individual Therapy  Karolee Stamps [facility] 11/24/2014, 11:43 AM

## 2014-11-25 ENCOUNTER — Inpatient Hospital Stay (HOSPITAL_COMMUNITY): Payer: Medicaid Other | Admitting: *Deleted

## 2014-11-25 ENCOUNTER — Inpatient Hospital Stay (HOSPITAL_COMMUNITY): Payer: Medicaid Other | Admitting: Occupational Therapy

## 2014-11-25 ENCOUNTER — Inpatient Hospital Stay (HOSPITAL_COMMUNITY): Payer: Self-pay | Admitting: Speech Pathology

## 2014-11-25 DIAGNOSIS — S82402S Unspecified fracture of shaft of left fibula, sequela: Secondary | ICD-10-CM

## 2014-11-25 NOTE — Progress Notes (Signed)
Physical Therapy Session Note  Patient Details  Name: Cory [name] MRN: [id number] Date of Birth: 1961-08-23  Today's Date: 11/25/2014 PT Individual Time: 1000-1045 PT Individual Time Calculation (min): 45 min   Short Term Goals: Week 5:  PT Short Term Goal 1 (Week 5): Contiue to work towards LTG's.   Skilled Therapeutic Interventions/Progress Updates:    Patient received supine in bed, asleep, but agreeable to participate. Session focused on functional transfers, wheelchair mobility, gait training, and L LE NMR. L AFO donned at beginning of session. Patient performing all squat pivot transfers and wheelchair mobility >150' x2 with R LE only with supervision. Emphasis on steps for proper set up of transfers and patient able to report all correctly with questioning cues. Gait training 25' x1 with R hemiwalker (L AFO) and min-modA, facilitation for increased weight shift to the L to increase step length on the R, upright posture. See below for L LE NMR. Patient returned to room and left sitting in wheelchair with seatbelt donned and all needs within reach.  Therapy Documentation Precautions:  Precautions Precautions: Fall Precaution Comments: L inattention, brace on L ankle from recent fx Restrictions Weight Bearing Restrictions: No Pain: Pain Assessment Pain Assessment: No/denies pain Pain Score: 0-No pain Locomotion : Ambulation Ambulation/Gait Assistance: 4: Min assist;3: Mod assist  Other Treatments: Treatments Neuromuscular Facilitation: Left;Lower Extremity;Forced use;Activity to increase motor control;Activity to increase timing and sequencing;Activity to increase sustained activation;Activity to increase lateral weight shifting (R LE step ups to increase weight shift to L LE and increased weight bearing through L LE; R UE support on handrail)  See FIM for current functional status  Therapy/Group: Individual Therapy  Chipper Herb. Janay Canan, PT, DPT 11/25/2014,  12:30 PM

## 2014-11-25 NOTE — Progress Notes (Signed)
Occupational Therapy Session Note  Patient Details  Name: Cory [name] MRN: [id number] Date of Birth: 01/05/1961  Today's Date: 11/25/2014 OT Individual Time: 1400-1445 OT Individual Time Calculation (min): 45 min    Short Term Goals: Week 5:  OT Short Term Goal 1 (Week 5): STGs= LTGs secondary to expected discharge  Skilled Therapeutic Interventions/Progress Updates:    Engaged in therapeutic activity with focus on LUE self-ROM, PROM, and stretching.  Pt reports pain in Lt shoulder, posterior/scapula area with PROM and stretching in supine, sidelying and unsupported sitting at EOB.  Noted tone in Lt biceps and elbow with resistance in elbow with all stretching.  Pt demonstrated self-ROM exercises from previously provided self-ROM HEP handout.  Further educated on self-ROM and positioning in bed to decrease elbow tone.  Engaged in bridging in bed to promote symmetrical usage of BLE while increasing positioning in bed.  Therapy Documentation Precautions:  Precautions Precautions: Fall Precaution Comments: L inattention, brace on L ankle from recent fx Restrictions Weight Bearing Restrictions: No General:   Vital Signs: Therapy Vitals Temp: 98.6 F (37 C) Temp Source: Oral Pulse Rate: 74 Resp: 18 BP: 130/85 mmHg Patient Position (if appropriate): Lying Oxygen Therapy SpO2: 100 % O2 Device: Not Delivered Pain: Pain Assessment Pain Assessment: No/denies pain Pain Score: 0-No pain  See FIM for current functional status  Therapy/Group: Individual Therapy  Rosalio Loud 11/25/2014, 2:55 PM

## 2014-11-25 NOTE — Progress Notes (Signed)
Speech Language Pathology Daily Session Note  Patient Details  Name: Trentin Knappenberger MRN: [id number] Date of Birth: 27-Aug-1961  Today's Date: 11/25/2014 SLP Individual Time: 1137-1201 SLP Individual Time Calculation (min): 24 min  Short Term Goals: Week 6: SLP Short Term Goal 1 (Week 6): Patient will recall and utilize recommended safe swallow strategies with Dys. 3 textures and thin liquids with supervision verbal cues to utilize safe swallow strategies to minimize overt s/s of aspiration.  SLP Short Term Goal 2 (Week 6): Patient will attend to left of environment during functional tasks with Min question cues. SLP Short Term Goal 3 (Week 6): Patient will label 2 physical and 2 cognitive deficits that have resulted from his CVA with Min question cues.  SLP Short Term Goal 4 (Week 6): Patient will request help as needed during functional tasks with Min question cues.   SLP Short Term Goal 5 (Week 6): Patient will selectively attend to a basic task for 15-20 minutes with Min verbal cues for redirection.  Skilled Therapeutic Interventions:  Pt was seen for skilled ST targeting dysphagia goals.  Upon arrival, pt was reclined in bed, awake, alert, and agreeable to participate in ST.  SLP provided min cues and increased processing time for initiation and sequencing to transfer from bed to wheelchair safely.  SLP then facilitated the session with skilled observations completed during presentations of pt's currently prescribed diet with pt exhibiting mild left pocketing and intermittent anterior loss of dys 3 solids due to left labial, lingual, and buccal weakness.  Pt recalled and utilized his recommended swallowing precautions to clear oral residue with supervision cues.  No overt s/s of aspiration were noted across solid or liquid consistencies.  Continue per current plan of care.    FIM:  Comprehension Comprehension Mode: Auditory Comprehension: 5-Follows basic conversation/direction: With no  assist Expression Expression Mode: Verbal Expression: 4-Expresses basic 75 - 89% of the time/requires cueing 10 - 24% of the time. Needs helper to occlude trach/needs to repeat words. Social Interaction Social Interaction: 3-Interacts appropriately 50 - 74% of the time - May be physically or verbally inappropriate. Problem Solving Problem Solving: 3-Solves basic 50 - 74% of the time/requires cueing 25 - 49% of the time Memory Memory: 4-Recognizes or recalls 75 - 89% of the time/requires cueing 10 - 24% of the time FIM - Eating Eating Activity: 5: Needs verbal cues/supervision;5: Set-up assist for open containers  Pain Pain Assessment Pain Assessment: No/denies pain Pain Score: 0-No pain  Therapy/Group: Individual Therapy  Aubrina Nieman, Melanee Spry 11/25/2014, 11:46 AM

## 2014-11-25 NOTE — Progress Notes (Signed)
[place] PHYSICAL MEDICINE & REHABILITATION     PROGRESS NOTE    Subjective/Complaints: No problems overnight. Appears comfortable No ankle pain   ROS limited due to mental status/affect  Objective: Vital Signs: Blood pressure 111/76, pulse 60, temperature 98.5 F (36.9 C), temperature source Oral, resp. rate 16, weight 76.204 kg (168 lb), SpO2 100 %. No results found. No results for input(s): WBC, HGB, HCT, PLT in the last 72 hours. No results for input(s): NA, K, CL, GLUCOSE, BUN, CREATININE, CALCIUM in the last 72 hours.  Invalid input(s): CO CBG (last 3)  No results for input(s): GLUCAP in the last 72 hours.  Wt Readings from Last 3 Encounters:  11/13/14 76.204 kg (168 lb)  10/15/14 79.289 kg (174 lb 12.8 oz)  09/03/14 86.183 kg (190 lb)    Physical Exam:  Constitutional: He is oriented to person, place, and time. He appears well-developed and well-nourished.  HENT:  Head: Normocephalic and atraumatic.  Eyes: Conjunctivae are normal. Pupils are equal, round, and reactive to light.   Neck: Normal range of motion. Neck supple.  Cardiovascular: Normal rate and regular rhythm.  Respiratory: Effort normal and breath sounds normal. No respiratory distress. He has no wheezes.  GI: Soft. Bowel sounds are normal.  Musculoskeletal: He exhibits mild pain with Left hip Int/ext rotation Neurological: He is alert and oriented to person, place, and time. Delayed processing.  Left facial weakness with dysarthria and left inattention.   Has poor insight and awareness of deficits and easily distracted . Motor strength 0/5 in left deltoid, biceps, triceps, 0/5 to?tr grip, 2-hip flexor, 2- knee extensor, 0 ankle dorsiflexion plantar flexion MAS 1 Left hamstrings, MAS 3 in L Hip Add, MAS 1-2 in Quad 5/5 on the right side in the same muscle groups.  Absent sensation in the left upper and left lower extremity.  Visual fields absent to confrontation testing on left side  Skin: Skin is  warm and dry.    Assessment/Plan: 1. Functional deficits secondary to right MCA infarct,Physiatrist is providing close team supervision and 24 hour management of active medical problems listed below. Physiatrist and rehab team continue to assess barriers to discharge/monitor patient progress toward functional and medical goals.  SNF pending  FIM: FIM - Bathing Bathing Steps Patient Completed: Chest, Right Arm, Abdomen, Front perineal area, Right upper leg, Left upper leg, Left Arm, Right lower leg (including foot), Left lower leg (including foot) Bathing: 4: Min-Patient completes 8-9 55f 10 parts or 75+ percent  FIM - Upper Body Dressing/Undressing Upper body dressing/undressing steps patient completed: Thread/unthread right sleeve of pullover shirt/dresss, Put head through opening of pull over shirt/dress Upper body dressing/undressing: 3: Mod-Patient completed 50-74% of tasks FIM - Lower Body Dressing/Undressing Lower body dressing/undressing steps patient completed: Thread/unthread left underwear leg, Thread/unthread right underwear leg, Thread/unthread right pants leg, Thread/unthread left pants leg, Don/Doff right sock, Don/Doff right shoe Lower body dressing/undressing: 3: Mod-Patient completed 50-74% of tasks  FIM - Toileting Toileting steps completed by patient: Performs perineal hygiene Toileting Assistive Devices: Grab bar or rail for support Toileting: 2: Max-Patient completed 1 of 3 steps  FIM - Diplomatic Services operational officer Devices: Grab bars Toilet Transfers: 4-To toilet/BSC: Min A (steadying Pt. > 75%)  FIM - Bed/Chair Transfer Bed/Chair Transfer Assistive Devices: Arm rests Bed/Chair Transfer: 6: Supine > Sit: No assist, 5: Bed > Chair or W/C: Supervision (verbal cues/safety issues), 5: Chair or W/C > Bed: Supervision (verbal cues/safety issues)  FIM - Locomotion: Wheelchair Distance: 150  Locomotion: Wheelchair: 5: Travels 150 ft or more: maneuvers on  rugs and over door sills with supervision, cueing or coaxing FIM - Locomotion: Ambulation Locomotion: Ambulation Assistive Devices: Orthosis, Environmental consultant - Hemi Ambulation/Gait Assistance: 4: Min assist, 3: Mod assist Locomotion: Ambulation: 1: Travels less than 50 ft with moderate assistance (Pt: 50 - 74%)  Comprehension Comprehension Mode: Auditory Comprehension: 5-Follows basic conversation/direction: With no assist  Expression Expression Mode: Verbal Expression Assistive Devices: 6-Talk trach valve Expression: 4-Expresses basic 75 - 89% of the time/requires cueing 10 - 24% of the time. Needs helper to occlude trach/needs to repeat words.  Social Interaction Social Interaction Mode: Asleep Social Interaction: 3-Interacts appropriately 50 - 74% of the time - May be physically or verbally inappropriate.  Problem Solving Problem Solving Mode: Not assessed Problem Solving: 3-Solves basic 50 - 74% of the time/requires cueing 25 - 49% of the time  Memory Memory Mode: Asleep Memory Assistive Devices: Memory book Memory: 4-Recognizes or recalls 75 - 89% of the time/requires cueing 10 - 24% of the time  Medical Problem List and Plan: 1. Functional deficits secondary to R-MCA infarct question embolic v/s thrombotic with resultant left hemiplegia with sensory deficits, left facial weakness, left neglect and dysphagia. 2. DVT Prophylaxis/Anticoagulation: Pharmaceutical: Lovenox 3. Pain Management: tylenol for h/a for now.  topamax for post stroke headache, improved will wean 4. Mood: Seems withdrawn--will monitor for now. LCSW to follow for support and evaluation.  5. Neuropsych: This patient is not capable of making decisions on his own behalf. 6. Skin/Wound Care: Routine pressure relief measures.  7. Fluids/Electrolytes/Nutrition: Monitor I/O. Follow up admission labs tomorrow 8. HTN: 9. H/o Alcohol abuse/Tobacco use: May d/c CIWA protocol. No signs of WD for several weeks 10.  Dyslipidemia: 11.  Mild HyperK--resolved 12.  Hx subacute ankle fx (10-11wks wks ago)will recheck xrays, may not need splint any longer depending on result, f/u ortho (Dr Ophelia Charter) as outpt 13.  Spasticity increasing primarily L hamstring, start zanaflex, monitor BP,  Botulinum to Hamstrings helpful , also with  Adductor and quad  spasticity       LOS (Days) 38 A FACE TO FACE EVALUATION WAS PERFORMED  Cory [name] 11/25/2014 7:21 AM

## 2014-11-26 ENCOUNTER — Inpatient Hospital Stay (HOSPITAL_COMMUNITY): Payer: Medicaid Other | Admitting: Speech Pathology

## 2014-11-26 ENCOUNTER — Inpatient Hospital Stay (HOSPITAL_COMMUNITY): Payer: Medicaid Other

## 2014-11-26 ENCOUNTER — Inpatient Hospital Stay (HOSPITAL_COMMUNITY): Payer: Self-pay

## 2014-11-26 ENCOUNTER — Inpatient Hospital Stay (HOSPITAL_COMMUNITY): Payer: Medicaid Other | Admitting: *Deleted

## 2014-11-26 NOTE — Progress Notes (Signed)
Social Work Patient ID: Cory [name], male   DOB: Jan 20, 1961, 54 y.o.   MRN: [id number] Director of Rehab met with pt and discussed discharge date set for Thursday, he will need to discuss with family and make plans for this date. NH search has been exhausted and no bed offers made.  Director has contacted pt's daughter and sister to discuss the set discharge date. Will  Arrange DME and follow up pt needs to let worker know where he is going.

## 2014-11-26 NOTE — Progress Notes (Signed)
Occupational Therapy Note  Patient Details  Name: Cory [name] MRN: [id number] Date of Birth: 10/24/1961  Today's Date: 11/26/2014 OT Individual Time: 1300-1400 OT Individual Time Calculation (min): 60 min   Pt c/o increased pain in left shoulder with PROM-unrated; repositioned Individual Therapy  Engaged in therapeutic activity/NMR with focus on LUE self-ROM, PROM, and stretching. Pt reports pain in Lt shoulder, posterior/scapula area with PROM and stretching in supine, sidelying and unsupported sitting at EOB. Noted tone in Lt biceps and elbow with resistance in elbow with all stretching. Pt required max multimodal cues to maintain sitting balance at midline.  Pt exhibits head tilt to right and shortening of trunk on right.  Pt performed squat pivot transfers w/c<>mat with close supervision.  Lavone NeriLanier, Lisa Milian [facility] 11/26/2014, 2:06 PM

## 2014-11-26 NOTE — Progress Notes (Signed)
Physical Therapy Session Note  Patient Details  Name: Cory SleightStephen [name] MRN: [id number] Date of Birth: 1961/07/13  Today's Date: 11/26/2014 PT Individual Time: 0940-1040 PT Individual Time Calculation (min): 60 min   Short Term Goals: Week 5:  PT Short Term Goal 1 (Week 5): Contiue to work towards LTG's.   Skilled Therapeutic Interventions/Progress Updates:   Pt received supine in bed, returning from xray. Pt transferred supine > sit with HOB flat and cues to not grab edge of bed with supervision. At edge of bed, pt required increased time, verbal cues, and eventually assist for hemi dressing technique to don pants and shirt. Pt performed sit <> stand from edge of bed and wheelchair with min A due to unsafe L foot placement, able to remember to not push up with RUE. Pt performed squat pivot transfers throughout session with supervision and questioning cues for safe wheelchair setup. Pt propelled w/c using RLE 2 x 150 ft in controlled environment with supervision. NuStep for L NMR using BLE only, level 3 x 10 min. Gait training using hemiwalker and L AFO, 2 x 30 ft with min-mod A with cues for upright posture, R knee extension during L swing phase, and advancing hemiwalker. Pt returned to room and left sitting in wheelchair with all needs within reach.  Therapy Documentation Precautions:  Precautions Precautions: Fall Precaution Comments: L inattention, brace on L ankle from recent fx Restrictions Weight Bearing Restrictions: No Pain: Pain Assessment Pain Assessment: No/denies pain  See FIM for current functional status  Therapy/Group: Individual Therapy  Kerney ElbeVarner, Preslie Depasquale A 11/26/2014, 11:50 AM

## 2014-11-26 NOTE — Progress Notes (Signed)
Speech Language Pathology Daily Session Note  Patient Details  Name: Cory [name] MRN: [id number] Date of Birth: November 29, 1960  Today's Date: 11/26/2014 SLP Individual Time: 1100-1200 SLP Individual Time Calculation (min): 60 min  Short Term Goals: Week 6: SLP Short Term Goal 1 (Week 6): Patient will recall and utilize recommended safe swallow strategies with Dys. 3 textures and thin liquids with supervision verbal cues to utilize safe swallow strategies to minimize overt s/s of aspiration.  SLP Short Term Goal 2 (Week 6): Patient will attend to left of environment during functional tasks with Min question cues. SLP Short Term Goal 3 (Week 6): Patient will label 2 physical and 2 cognitive deficits that have resulted from his CVA with Min question cues.  SLP Short Term Goal 4 (Week 6): Patient will request help as needed during functional tasks with Min question cues.   SLP Short Term Goal 5 (Week 6): Patient will selectively attend to a basic task for 15-20 minutes with Min verbal cues for redirection.  Skilled Therapeutic Interventions: Skilled treatment session focused on addressing dysphagia and cognition goals. SLP facilitated session with Min cues for recall and use of safe swallowing strategies during consumption of regular textures and thin liquids.  Cuing was effective at preventing overt s/s of aspiration throughout trials and as a result recommend initiation of a diet upgrade to regular textures and thin liquids with continued full supervision for cuing.  SLP also facilitated session with Mod multimodal cues to attend to left upper extremity during self-feeding task as well as left of environment during wheel chair mobility task to and from room.     FIM:  Comprehension Comprehension Mode: Auditory Comprehension: 5-Understands basic 90% of the time/requires cueing < 10% of the time Expression Expression Mode: Verbal Expression: 5-Expresses basic needs/ideas: With no assist Social  Interaction Social Interaction: 4-Interacts appropriately 75 - 89% of the time - Needs redirection for appropriate language or to initiate interaction. Problem Solving Problem Solving: 3-Solves basic 50 - 74% of the time/requires cueing 25 - 49% of the time Memory Memory: 3-Recognizes or recalls 50 - 74% of the time/requires cueing 25 - 49% of the time FIM - Eating Eating Activity: 5: Needs verbal cues/supervision;5: Set-up assist for open containers;4: Helper checks for pocketed food  Pain Pain Assessment Pain Assessment: No/denies pain  Therapy/Group: Individual Therapy  Charlane FerrettiMelissa Dhillon [place], M.A., CCC-SLP [phone]  [name] 11/26/2014, 3:01 PM

## 2014-11-27 ENCOUNTER — Inpatient Hospital Stay (HOSPITAL_COMMUNITY): Payer: Self-pay | Admitting: Speech Pathology

## 2014-11-27 ENCOUNTER — Inpatient Hospital Stay (HOSPITAL_COMMUNITY): Payer: Medicaid Other | Admitting: Occupational Therapy

## 2014-11-27 ENCOUNTER — Inpatient Hospital Stay (HOSPITAL_COMMUNITY): Payer: Medicaid Other | Admitting: Physical Therapy

## 2014-11-27 LAB — CREATININE, SERUM
Creatinine, Ser: 0.78 mg/dL (ref 0.50–1.35)
GFR calc Af Amer: >90 mL/min (ref >90)
GFR calc non Af Amer: >90 mL/min (ref >90)

## 2014-11-27 LAB — BASIC METABOLIC PANEL
ANION GAP: 12 (ref 5–15)
BUN: 10 mg/dL (ref 6–23)
CHLORIDE: 107 meq/L (ref 96–112)
CO2: 20 mmol/L (ref 19–32)
Calcium: 9.1 mg/dL (ref 8.4–10.5)
Creatinine, Ser: 0.75 mg/dL (ref 0.50–1.35)
GFR calc Af Amer: >90 mL/min (ref >90)
Glucose, Bld: 95 mg/dL (ref 70–99)
POTASSIUM: 4.1 mmol/L (ref 3.5–5.1)
SODIUM: 139 mmol/L (ref 135–145)

## 2014-11-27 MED ORDER — TIZANIDINE HCL 2 MG PO TABS
2.0000 mg | ORAL_TABLET | Freq: Three times a day (TID) | ORAL | Status: AC
Start: 2014-11-27 — End: ?

## 2014-11-27 MED ORDER — ASPIRIN 81 MG PO TBEC
325.0000 mg | DELAYED_RELEASE_TABLET | Freq: Every day | ORAL | Status: DC
Start: 2014-11-27 — End: 2014-11-27

## 2014-11-27 MED ORDER — ASPIRIN 325 MG PO TABS: 325.0000 mg | ORAL_TABLET | Freq: Every day | ORAL | Status: DC

## 2014-11-27 MED ORDER — PANTOPRAZOLE SODIUM 20 MG PO TBEC: 20.0000 mg | DELAYED_RELEASE_TABLET | Freq: Every day | ORAL | Status: DC

## 2014-11-27 MED ORDER — SENNOSIDES-DOCUSATE SODIUM 8.6-50 MG PO TABS: 2.0000 | ORAL_TABLET | Freq: Every day | ORAL | Status: AC

## 2014-11-27 MED ORDER — ASPIRIN EC 325 MG PO TBEC
325.0000 mg | DELAYED_RELEASE_TABLET | Freq: Every day | ORAL | Status: DC
Start: 2014-11-28 — End: 2014-11-28
  Administered 2014-11-28: 325 mg via ORAL
  Filled 2014-11-27 (×2): qty 1

## 2014-11-27 MED ORDER — ATORVASTATIN CALCIUM 20 MG PO TABS: 20.0000 mg | ORAL_TABLET | Freq: Every day | ORAL | Status: DC

## 2014-11-27 MED ORDER — ASPIRIN 81 MG PO TBEC: 81.0000 mg | DELAYED_RELEASE_TABLET | Freq: Every day | ORAL | Status: DC

## 2014-11-27 MED ORDER — PANTOPRAZOLE SODIUM 20 MG PO TBEC
20.0000 mg | DELAYED_RELEASE_TABLET | Freq: Every day | ORAL | Status: DC
  Administered 2014-11-28: 20 mg via ORAL
  Filled 2014-11-27 (×3): qty 1

## 2014-11-27 NOTE — Plan of Care (Signed)
Problem: RH Problem Solving Goal: LTG Patient will demonstrate problem solving for (SLP) LTG: Patient will demonstrate problem solving for basic/complex daily situations with cues (SLP)  Outcome: Not Met (add Reason) Pt continues to require min-mod assist for functional problem solving of basic daily situations due to impulsivity and poor awareness of deficits   Problem: RH Awareness Goal: LTG: Patient will demonstrate intellectual/emergent (SLP) LTG: Patient will demonstrate intellectual/emergent/anticipatory awareness with assist during a cognitive/linguistic activity (SLP)  Outcome: Not Met (add Reason) Pt continues to require mod-max assist for emergent awareness of physical and cognitive deficits during functional tasks.

## 2014-11-27 NOTE — Progress Notes (Signed)
Recreational Therapy Session Note  Patient Details  Name: Naida SleightStephen Ines MRN: [id number] Date of Birth: 1961-10-29 Today's Date: 11/27/2014  Pain: no c/o Skilled Therapeutic Interventions/Progress Updates: Pt finishing up getting dressed with OT upon my entry into room. Remainder of session spent discussing discharge plans.  Rehab Director stopped by to discuss recent telephone conversation with pt's daughter in which Director reported daughter hung up on him as she does not feel that pt should discharge to girlfriends home.  Director also reported that if he is still in agreement to go home with girlfriend locally versus going out of state, that services are being arranged to assist with safety and medical management.  Pt agreeable to this plan but stated plans to talk further with his daughter. Therapy/Group: Individual Therapy  Brit Wernette 11/27/2014, 3:48 PM

## 2014-11-27 NOTE — Progress Notes (Addendum)
[facility] PHYSICAL MEDICINE & REHABILITATION     PROGRESS NOTE    Subjective/Complaints: WHat about my arm? We discussed poor prognosis for recovery of LUE based on lack of movement now > 6wks post CVA No ankle pain   ROS limited due to mental status/affect  Objective: Vital Signs: Blood pressure 111/71, pulse 63, temperature 98.4 F (36.9 C), temperature source Oral, resp. rate 17, weight 76.204 kg (168 lb), SpO2 100 %. Dg Ankle 2 Views Left  11/26/2014   CLINICAL DATA:  Tenderness left ankle. No injury. Ankle fracture October 2015.  EXAM: LEFT ANKLE - 2 VIEW  COMPARISON:  10/24/2014  FINDINGS: Examination demonstrates evidence of interval healing of patient's left distal fibular fracture without significant residual lucency at the fracture site. Ankle mortise is intact. Remainder of the exam is unchanged.  IMPRESSION: Interval healing distal fibular fracture.  No acute injury.   Electronically Signed   By: Marin Olp M.D.   On: 11/26/2014 10:04   No results for input(s): WBC, HGB, HCT, PLT in the last 72 hours. No results for input(s): NA, K, CL, GLUCOSE, BUN, CREATININE, CALCIUM in the last 72 hours.  Invalid input(s): CO CBG (last 3)  No results for input(s): GLUCAP in the last 72 hours.  Wt Readings from Last 3 Encounters:  11/13/14 76.204 kg (168 lb)  10/15/14 79.289 kg (174 lb 12.8 oz)  09/03/14 86.183 kg (190 lb)    Physical Exam:  Constitutional: He is oriented to person, place, and time. He appears well-developed and well-nourished.  HENT:  Head: Normocephalic and atraumatic.  Eyes: Conjunctivae are normal. Pupils are equal, round, and reactive to light.   Neck: Normal range of motion. Neck supple.  Cardiovascular: Normal rate and regular rhythm.  Respiratory: Effort normal and breath sounds normal. No respiratory distress. He has no wheezes.  GI: Soft. Bowel sounds are normal.  Musculoskeletal: He exhibits mild pain with Left hip Int/ext  rotation Neurological: He is alert and oriented to person, place, and time. Delayed processing.  Left facial weakness with dysarthria and left inattention.   Has poor insight and awareness of deficits and easily distracted . Motor strength 0/5 in left deltoid, biceps, triceps, 0/5 to?tr grip, 2-hip flexor, 3- knee extensor, 0 ankle dorsiflexion plantar flexion MAS 1 Left hamstrings, MAS 3 in L Hip Add, MAS 1-2 in Quad 5/5 on the right side in the same muscle groups.  Absent sensation in the left upper and left lower extremity.  Visual fields absent to confrontation testing on left side  Skin: Skin is warm and dry.    Assessment/Plan: 1. Functional deficits secondary to right MCA infarct,Physiatrist is providing close team supervision and 24 hour management of active medical problems listed below. Physiatrist and rehab team continue to assess barriers to discharge/monitor patient progress toward functional and medical goals. Based on registered sex offender status unable to place in SNF Requires 24/7 sup. Poor awareness of deficits, left neglect, poor balance and Left hemiparesis with spasticity        Rec HHPT, OT, SLP and aide May be able to go to girlfriend's place  Team conference today please see physician documentation under team conference tab, met with team face-to-face to discuss problems,progress, and goals. Formulized individual treatment plan based on medical history, underlying problem and comorbidities.  FIM: FIM - Bathing Bathing Steps Patient Completed: Chest, Right Arm, Abdomen, Front perineal area, Right upper leg, Left upper leg, Left Arm, Right lower leg (including foot), Left lower leg (including  foot) Bathing: 4: Min-Patient completes 8-9 67f 10 parts or 75+ percent  FIM - Upper Body Dressing/Undressing Upper body dressing/undressing steps patient completed: Thread/unthread right sleeve of pullover shirt/dresss, Put head through opening of pull over shirt/dress Upper body  dressing/undressing: 3: Mod-Patient completed 50-74% of tasks FIM - Lower Body Dressing/Undressing Lower body dressing/undressing steps patient completed: Thread/unthread left underwear leg, Thread/unthread right underwear leg, Thread/unthread right pants leg, Thread/unthread left pants leg, Don/Doff right sock, Don/Doff right shoe Lower body dressing/undressing: 3: Mod-Patient completed 50-74% of tasks  FIM - Toileting Toileting steps completed by patient: Performs perineal hygiene Toileting Assistive Devices: Grab bar or rail for support Toileting: 2: Max-Patient completed 1 of 3 steps  FIM - Radio producer Devices: Grab bars Toilet Transfers: 4-To toilet/BSC: Min A (steadying Pt. > 75%)  FIM - Bed/Chair Transfer Bed/Chair Transfer Assistive Devices: Arm rests Bed/Chair Transfer: 5: Supine > Sit: Supervision (verbal cues/safety issues), 5: Bed > Chair or W/C: Supervision (verbal cues/safety issues)  FIM - Locomotion: Wheelchair Distance: 150 Locomotion: Wheelchair: 5: Travels 150 ft or more: maneuvers on rugs and over door sills with supervision, cueing or coaxing FIM - Locomotion: Ambulation Locomotion: Ambulation Assistive Devices: Orthosis, Walker - Hemi (L AFO) Ambulation/Gait Assistance: 4: Min assist, 3: Mod assist Locomotion: Ambulation: 1: Travels less than 50 ft with moderate assistance (Pt: 50 - 74%)  Comprehension Comprehension Mode: Auditory Comprehension: 5-Understands basic 90% of the time/requires cueing < 10% of the time  Expression Expression Mode: Verbal Expression Assistive Devices: 6-Talk trach valve Expression: 5-Expresses basic needs/ideas: With no assist  Social Interaction Social Interaction Mode: Asleep Social Interaction: 4-Interacts appropriately 75 - 89% of the time - Needs redirection for appropriate language or to initiate interaction.  Problem Solving Problem Solving Mode: Not assessed Problem Solving: 3-Solves  basic 50 - 74% of the time/requires cueing 25 - 49% of the time  Memory Memory Mode: Asleep Memory Assistive Devices: Memory book Memory: 3-Recognizes or recalls 50 - 74% of the time/requires cueing 25 - 49% of the time  Medical Problem List and Plan: 1. Functional deficits secondary to R-MCA infarct question embolic v/s thrombotic with resultant left hemiplegia with sensory deficits, left facial weakness, left neglect and dysphagia. 2. DVT Prophylaxis/Anticoagulation: Pharmaceutical: Lovenox 3. Pain Management: tylenol for h/a for now.  topamax for post stroke headache, improved will D/C 4. Mood: Seems withdrawn--will monitor for now. LCSW to follow for support and evaluation.  5. Neuropsych: This patient is not capable of making decisions on his own behalf. 6. Skin/Wound Care: Routine pressure relief measures.  7. Fluids/Electrolytes/Nutrition: Monitor I/O. Follow up admission labs tomorrow 8. HTN: 9. H/o Alcohol abuse/Tobacco use: May d/c CIWA protocol. No signs of WD for several weeks 10. Dyslipidemia:  11.  Hx subacute ankle fx (10-11wks wks ago)will recheck xrays, may not need splint any longer depending on result, f/u ortho (Dr Lorin Mercy) as outpt 12.  Spasticity increasing primarily L hamstring, start zanaflex, monitor BP,  Botulinum to Hamstrings helpful , also with  Adductor and quad  spasticity       LOS (Days) 40 A FACE TO FACE EVALUATION WAS PERFORMED  Charlett Blake 11/27/2014 7:43 AM

## 2014-11-27 NOTE — Progress Notes (Signed)
Social Work Patient ID: Cory [name], male   DOB: 02/19/1961, 54 y.o.   MRN: [id number] Met with pt to discuss follow up home health-[facility] and equipment being delivered to home this afternoon-early evening. Discussed ETOH and resources and he is aware  of them and doesn't request referrals.  He feels he does not need it now and it has been 7 weeks since he had any. He is agreeable to the plans and wants to make sure he will get more therapies. Will ask [facility] liasion to meet with him before discharge.  See tomorrow to answer questions that may come up.

## 2014-11-27 NOTE — Progress Notes (Signed)
Social Work Patient ID: Cory SleightStephen Forness, male   DOB: 12-12-1960, 54 y.o.   MRN: [id number] Spoke with Haywood LassoLynette to discuss plan she will let him come to her home on Thursday.  Discussed delivery of wheelchair, tub bench and bedside commode after 3;30 today and pt coming tomorrow after 3;00 via ambulance.  Pt is agreeable to this plan and also aware Haywood LassoLynette is planning to move back to [place] in two weeks. Pt will need to plan what to do and his options prior to this. He will talk with Haywood LassoLynette later today and his daughter and sister are aware of the discharge plan and date. Pt is in agreement with this plan. [facility] and get home health follow up arranged.

## 2014-11-27 NOTE — Plan of Care (Signed)
Problem: RH Memory Goal: LTG Patient demonstrate ability for day to day recall (PT) LTG: Patient will demonstrate ability for day to day recall/carryover during mobility activities with assist (PT)  Outcome: Not Met (add Reason) Continues to require mod to max verbal cues to recall safe w/c set up and positioning during transfers.      

## 2014-11-27 NOTE — Discharge Summary (Signed)
Physician Discharge Summary  Patient ID: Cory [name] MRN: [id number] DOB/AGE: [birth year] 54 y.o.  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 No injury. Ankle fracture October 2015.  EXAM: LEFT ANKLE - 2 VIEW  COMPARISON:  10/24/2014  FINDINGS: Examination demonstrates evidence of interval healing of patient's left distal fibular fracture without significant residual lucency at the fracture site. Ankle mortise is intact. Remainder of the exam is unchanged.  IMPRESSION: Interval healing distal fibular fracture.  No acute injury.   Electronically Signed   By: Elberta Fortis M.D.   On: 11/26/2014 10:04    Labs:  BMET    Component Value Date/Time   NA 139 11/27/2014 0623   K 4.1 11/27/2014 0623   CL 107 11/27/2014 0623   CO2 20 11/27/2014 0623   GLUCOSE 95 11/27/2014 0623   BUN 10 11/27/2014 0623   CREATININE 0.78 11/27/2014 0623   CREATININE 0.75 11/27/2014 0623   CALCIUM 9.1 11/27/2014 0623   GFRNONAA >90 11/27/2014 0623   GFRNONAA >90 11/27/2014 0623   GFRAA >90 11/27/2014 0623   GFRAA >90 11/27/2014 0623     CBC:    Component Value Date/Time   WBC 6.3 10/24/2014 0750   RBC 5.26 10/24/2014 0750   HGB 14.7 10/24/2014 0750   HCT 45.2 10/24/2014 0750   PLT 238 10/24/2014 0750   MCV 85.9 10/24/2014 0750   MCH 27.9 10/24/2014 0750   MCHC 32.5 10/24/2014 0750   RDW 12.3 10/24/2014 0750   LYMPHSABS 2.3 10/24/2014 0750   MONOABS 0.7 10/24/2014 0750   EOSABS 0.2 10/24/2014 0750   BASOSABS 0.0 10/24/2014 0750     CBG: No results for input(s):  GLUCAP in the last 168 hours.  Brief HPI:   Cory [name] is a 54 y.o. RH- male with history of HTN, Hyperlipidemia, heavy alcohol use, noncompliance with meds; who was admitted on 10/15/14 with left facial droop, slurred speech, left sided weakness and somnolence. CT head with ischemic changes in R-MCA territory affecting frontal cortex, anterior coronal radiata and dorsal striatum. 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  MBS done revealing moderate oral and mild pharyngeal dysphagia and patient placed on dysphagia 2, nectar liquids. Neurology recommended ASA for secondary stroke prevention. Therapy evaluations done 11/25 and patient limited by dense L- hemiplegia, poor truncal control with impaired poor safety awareness. Plans for loop recorder today? CIR recommended by rehab team and MD for follow up therapy.    Hospital Course: Cory [name] was admitted to rehab 10/18/2014 for inpatient therapies to consist of PT, ST and OT at least three hours five days a week. Past admission physiatrist, therapy team and rehab RN have worked together to provide customized collaborative inpatient rehab. He was maintained on ASA for secondary stroke prevention. He did have an episode of vomiting with hematemesis likely due to [employer] tear and ASA was decreased and protonix added to help with healing. Recheck CBC shows that H/H is stable. ASA has been increased back to full strength. Diet has been advanced to  dysphagia 3, thin liquids and he is tolerating this without difficulty. Headaches have resolved therefore topamax was discontinued. Blood pressures have been monitored on tid basis and have been controlled. He has not shown any signs of alcohol withdrawal and had bee educated on need for alcohol and tobacco cessation.   He continues to have difficulty weightbearing thorough left ankle. Follow up  X rays were done on 12/3 showing healing fracture . Repeat films on 01/05 revealed interval healing without significant residual lucency at fracture site. He continues with left hemiparesis with sensory deficits and poor motor return in LUE. He has developed spasticity on the left and was started on Zanaflex to help alleviate symptoms.  Left hamstring was injected with botox to help decrease adductor and quad spasticity.    Patient has reached maximal gains of CIR stay and has plateaued. He currently requires supervision at wheelchair level due to need for moderate cues for all aspects of mobility. Family was contacted for discharge but have abdicated/declined to provide housing, assistance or care for patient past discharge. SNF is unavailable due to lack of insurance as well as legal ramifications due to patient's prior history. Patient's girlfriend has agreed to assist as needed and was agreeable to provide residence for him.  He has been given a month's supply of medication via [facility] and has been set up for follow up with primary care with [facility].  He will continue to receive HHPT, HHOT,HHRN and HHSW by [facility] past discharge.    Rehab course: During patient's stay in rehab weekly team conferences were held to monitor patient's progress, set goals and discuss barriers to discharge. Patient has had improvement in activity tolerance, balance, postural control, as well as ability to compensate for deficits. He is has had improvement in functional use LUE/ LLE and is starting to show some improvement in awareness. He requires min assist with bathing as well as upper body dressing. He requires moderate assist with lower body dressing. He is able to perform transfers with supervision and requires min to moderate assist with ambulation of 5275' with L-AFO and hemi walker.  He continues to have poor insight as well as poor awarness of deficits. He requires min to moderate cues  for basic problem solving and moderate to mac cues for emergent awareness of physical and cognitive deficits.    Disposition: Home   Diet: Heart healthy.   Special Instructions: 1. Needs 24 hours supervision.     Medication List    STOP taking these medications        folic acid 1 MG tablet  Commonly known as:  FOLVITE      TAKE these medications        aspirin 325 MG tablet  Take 1 tablet (325 mg total) by mouth daily.     atorvastatin 20 MG tablet  Commonly known as:  LIPITOR  Take 1 tablet (20 mg total) by mouth daily at 6 PM.     pantoprazole 20 MG tablet  Commonly known as:  PROTONIX  Take 1 tablet (20 mg total) by mouth daily.  Start taking on:  11/28/2014     senna-docusate 8.6-50 MG per tablet  Commonly known as:  Senokot-S  Take 2 tablets by mouth at bedtime.     thiamine 100 MG tablet  Take 1 tablet (100 mg total) by mouth daily.     tiZANidine 2 MG tablet  Commonly known as:  ZANAFLEX  Take 1 tablet (  2 mg total) by mouth 3 (three) times daily.       Follow-up Information    Follow up with Erick Colace, MD On 12/16/2014.   Specialty:  Physical Medicine and Rehabilitation   Why:  Be there at 12:15 pm  for 12:45 pm  appointment   Contact information:   [address] [place] [address] [phone]       Follow up with Delia Heady, MD. Call today.   Specialties:  Neurology, Radiology   Why:  for follow up appointment   Contact information:   [address] [place] [address] [phone]       [facility] with [facility] On 11/28/2014.   Why:  APPT @ 9;00 AM   Contact information:   [address] [place] [address] [phone]      Signed: Jacquelynn Cree 11/27/2014, 3:47 PM

## 2014-11-27 NOTE — Progress Notes (Signed)
Occupational Therapy Discharge Summary and intervention session  Patient Details  Name: Cory [name] MRN: [id number] Date of Birth: October 28, 1961  Today's Date: 11/27/2014 OT Individual Time: 2426-8341 OT Individual Time Calculation (min): 45 min    Patient has met 9 of 12 long term goals due to improved activity tolerance, improved balance, ability to compensate for deficits, improved attention, improved awareness and improved coordination.  Patient to discharge at overall min- mod level.  Patient will be discharging to live with girlfriend who was offered family training but declined.   Reasons goals not met: Goals not meet secondary to patients slow progress towards remaining goals.  Recommendation:  Patient will benefit from ongoing skilled OT services in home health setting to continue to advance functional skills in the area of BADL.  Equipment: defer to [facility]  Reasons for discharge: lack of progress toward goals  Patient/family agrees with progress made and goals achieved: Yes   OT intervention: Upon entering the room, pt seated in wheelchair awaiting therapist. Pt with no c/o pain during session. OT discussed in great deal that pt is not to attempt to dress self, wash self, or cook food without assistance from caregiver. Pt shows poor insight into these areas. Pt propelled self in wheelchair to bathroom with supervision for squat pivot wheelchair <> TTB. Therapist continues to encourage pt to utilize [place] sponge and bath mitt in order to increase independence with bathing. Pt requiring min A for lateral lean to clean buttocks. Pt engaging in bathing and grooming at sink side. Pt standing with min A to place L hand onto sink side for weight bearing while performing LB clothing management. Pt seated in wheelchair with with QRB donned and call bell within reach. Pt with no further concerns.  OT Discharge Precautions/Restrictions  Restrictions Weight Bearing Restrictions: No Pain Pain  Assessment Pain Assessment: No/denies pain Pain Score: 0-No pain Vision/Perception  Vision- Assessment Tracking/Visual Pursuits: Other (comment) (continues to have decreased smoothness with horizontal and vertical tracking, requires min verbal cues for head turns and eye shifts)  Cognition Overall Cognitive Status: Impaired/Different from baseline Arousal/Alertness: Awake/alert Orientation Level: Oriented X4 Attention: Selective Selective Attention: Impaired Selective Attention Impairment: Functional basic Memory: Impaired Memory Impairment: Decreased recall of new information;Retrieval deficit;Storage deficit Awareness: Impaired Awareness Impairment: Emergent impairment Problem Solving: Impaired Problem Solving Impairment: Functional basic Executive Function: Self Monitoring;Self Correcting;Reasoning Reasoning: Impaired Reasoning Impairment: Verbal basic;Functional basic Self Monitoring: Impaired Self Monitoring Impairment: Functional basic Self Correcting: Impaired Self Correcting Impairment: Functional basic Behaviors: Impulsive;Perseveration Safety/Judgment: Impaired Sensation Sensation Light Touch: Impaired Detail Light Touch Impaired Details: Impaired LUE;Impaired LLE Stereognosis: Not tested Hot/Cold: Not tested Hot/Cold Impaired Details: Impaired LUE;Impaired LLE Proprioception: Impaired Detail Proprioception Impaired Details: Impaired LUE;Impaired LLE Coordination Gross Motor Movements are Fluid and Coordinated: No Fine Motor Movements are Fluid and Coordinated: No Coordination and Movement Description: decreased strength and ROM in L UE/LE Heel Shin Test: unable to perform due to weakness Motor  Motor Motor: Hemiplegia;Abnormal tone;Abnormal postural alignment and control Motor - Discharge Observations: decreased strength and ROM in L LE, 0/5 throughout L UE with increased tone in bicep and elbow, increased tone in L hamstrings and adductors Mobility  Bed  Mobility Bed Mobility: Supine to Sit;Sit to Supine Supine to Sit: 5: Supervision Supine to Sit Details: Verbal cues for sequencing;Verbal cues for technique;Verbal cues for precautions/safety Sitting - Scoot to Edge of Bed: 3: Mod assist Sitting - Scoot to Edge of Bed Details: Manual facilitation for weight shifting;Manual facilitation for placement;Verbal cues for  technique;Verbal cues for sequencing;Tactile cues for weight shifting;Tactile cues for sequencing;Tactile cues for initiation Sit to Supine: 5: Supervision Sit to Supine - Details: Verbal cues for sequencing;Verbal cues for technique;Verbal cues for precautions/safety Transfers Transfers: Sit to Stand;Stand to Sit Sit to Stand: 4: Min assist Sit to Stand Details: Verbal cues for sequencing;Verbal cues for technique;Verbal cues for precautions/safety;Manual facilitation for weight shifting Stand to Sit: 4: Min assist Stand to Sit Details (indicate cue type and reason): Verbal cues for sequencing;Verbal cues for technique;Verbal cues for precautions/safety;Manual facilitation for weight shifting  Trunk/Postural Assessment  Cervical Assessment Cervical Assessment: Exceptions to [facility] Cervical Strength Overall Cervical Strength Comments: head laterally to R Thoracic Assessment Thoracic Assessment: Within Functional Limits Lumbar Assessment Lumbar Assessment: Within Functional Limits Postural Control Postural Control: Deficits on evaluation Righting Reactions: delayed Protective Responses: delayed Postural Limitations: Has extreme trunk shortening on the R during functional activities and keeps trunk rotated to the L during gait.   Balance Balance Balance Assessed: Yes Static Sitting Balance Static Sitting - Balance Support: Feet supported Static Sitting - Level of Assistance: 6: Modified independent (Device/Increase time) Dynamic Sitting Balance Sitting balance - Comments: Stand by assist Static Standing Balance Static  Standing - Balance Support: Right upper extremity supported;During functional activity Static Standing - Level of Assistance: 4: Min assist Extremity/Trunk Assessment RUE Assessment RUE Assessment: Within Functional Limits LUE Assessment LUE Assessment: Exceptions to WFL LUE AROM (degrees) Overall AROM Left Upper Extremity: Deficits (Pt has no AROM in L UE at this time. PROM limited as well with increased tone in bicep and elbow. However, has been taught self ROM HEP.) LUE Strength LUE Overall Strength: Deficits  See FIM for current functional status  Phineas Semen 11/27/2014, 11:37 AM

## 2014-11-27 NOTE — Progress Notes (Signed)
Social Work Patient ID: Naida SleightStephen [name], male   DOB: November 13, 1961, 54 y.o.   MRN: [id number] Rehab Director returned daughter's call and explained pt along with girlfriend have decided to go to her home upon discharge tomorrow.   Daughter does not feel this is the best place for pt and doesn't agree with discharge. Told she could come and take pt home, which she hung up on Director. Director spoke with pt along with Lisa-RT present and explained his daughter hung up on him when discussing discharge plan and date. Pt is aware discharge tomorrow and The services arranged and girlfriend on board.  He is aware of her plans to [place] [facility] and he plans to stay in [place] and is arranging a plan for himself.

## 2014-11-27 NOTE — Progress Notes (Signed)
Speech Language Pathology Discharge Summary  Patient Details  Name: Cory [name] MRN: [id number] Date of Birth: 03/28/1961  Today's Date: 11/27/2014 SLP Individual Time: 1000-1025 SLP Individual Time Calculation (min): 25 min   Skilled Therapeutic Interventions:  Pt was seen for skilled ST targeting cognitive goals and ongoing pt education.  Pt with visitors upon arrival and was internally distracted by upcoming discharge and presented with questions about what he could do after leaving the hospital to continue getting better.  As pt continues to present with poor intellectual/emergent awareness of deficits, SLP facilitated the session with guided questions to facilitate pt's independence for identifying current goals of therapy.  Pt was able to identify 2 cognitive deficits (i.e. impulsivity and decreased left attention) occurring s/p stroke with min question cues.  Following identification of deficits, SLP provided pt with skilled education related to activities to maximize cognitive remediation/compensation following discharge.   Pt was also able to recall at least 2 compensatory strategies to minimize overt s/s of aspiration with his currently prescribed diet with supervision question cues.       Patient has met 5 of 7 long term goals.  Patient to discharge at overall Min;Mod level.  Reasons goals not met: pt continues to require min-mod cues for basic problem solving and mod-max cues for emergent awareness of physical and cognitive deficits    Clinical Impression/Discharge Summary:   Pt made slow and inconsistent functional gains while inpatient and is discharging having met 5 out of 7 long term goals.  Pt has demonstrated improvements in swallowing function and has been upgraded to a regular diet with thin liquids.  Pt continues to require full supervision for use of swallowing precautions during meals to minimize overt s/s of aspiration with his currently prescribed diet due to cognitive  deficits.  Pt currently requires overall min-mod assist during cognitive tasks due to decreased selective attention, left inattention, impulsivity, poor carryover of daily information, poor intellectual/emergent awareness of deficits, and decreased functional problem solving.  Pt is reportedly discharging to his girlfriend's home; however, girlfriend will be moving out of state in ~2 weeks and will not be available to provide assistance or supervision.  Pt and pt's family are aware that therapies are recommending 24/7 supervision.  Education has been offered to numerous members of pt's family and to pt's girlfriend and has been declined repeatedly.  Continue to recommend 24/7 supervision which pt will likely not have at discharge due to decreased caregiver support.     Care Partner:  Caregiver Able to Provide Assistance: Other (comment) (pt's family declining family education/training at this time)  Type of Caregiver Assistance: Physical  Recommendation:  24 hour supervision/assistance;Skilled Nursing facility  Rationale for SLP Follow Up: Maximize functional communication;Maximize cognitive function and independence;Reduce caregiver burden   Equipment: none recommended by SLP    Reasons for discharge: Discharged from hospital   Patient/Family Agrees with Progress Made and Goals Achieved:  (Pt unable, family not present )   See FIM for current functional status  PageSelinda Orion 11/27/2014, 3:53 PM

## 2014-11-27 NOTE — Plan of Care (Signed)
Problem: RH Dressing Goal: LTG Patient will perform upper body dressing (OT) LTG Patient will perform upper body dressing with assist, with/without cues (OT).  Outcome: Not Met (add Reason) Min A at this time.  Problem: RH Toileting Goal: LTG Patient will perform toileting w/assist, cues/equip (OT) LTG: Patient will perform toiletiing (clothes management/hygiene) with assist, with/without cues using equipment (OT)  Outcome: Not Met (add Reason) Max A currently as pt requires assistance with clothing management and hygiene.

## 2014-11-27 NOTE — Progress Notes (Signed)
Physical Therapy Discharge Summary  Patient Details  Name: Cory [name] MRN: [id number] Date of Birth: [birth year]  Today's Date: 11/27/2014 PT Individual Time: 1400-1515 PT Individual Time Calculation (min): 75 min    Patient has met 9 of 10 long term goals due to improved activity tolerance, improved balance, improved postural control, increased strength, ability to compensate for deficits, functional use of  left lower extremity and improved attention.  Patient to discharge at a wheelchair level Supervision.   Patient's care partner unavailable to provide the necessary physical and cognitive assistance at discharge.  Pt to D/C to girlfriend's house for two weeks, however gf will be working during the day.  PT/OT/SLP are continuing to recommend 24/7 S strongly and hands on assist for toileting and ADL tasks.  Note that he has 3 small steps to enter home.  No family available for training, however discussed, demonstrated and provided handout to pt during session.   Reasons goals not met: Pt continues to require mod to max verbal cues for safe w/c set up and positioning during all aspects of mobility.    Recommendation:  Patient will benefit from ongoing skilled PT services in home health setting to continue to advance safe functional mobility, address ongoing impairments in decreased balance, decreased functional use of LUE/LE, decreased awareness, decreased attention, decreased safety, and minimize fall risk.  Equipment: w/c and cushion  Reasons for discharge: treatment goals met and discharge from hospital  Patient/family agrees with progress made and goals achieved: Yes   PT Treatment/Intervention:  Pt received lying in bed in room, agreeable to therapy session.  Skilled session focused on grad day activities and checking off goals for D/C tomorrow.  Performed bed mobility at S level.  Assisted with donning shoes and L AFO to maximize time during session.  Transferred to w/c at S level  with continued cues for safety.  Had lengthy discussion with pt regarding safety at home and home entry.  Discussed that pt would NOT be ambulatory at home and would need to remain in w/c or furniture as much as possible to minimize fall risk.  Also discussed safe entry into home and that gf and her son would both need to assist him.  Provided demonstration and handout on how to safely assist pt.  Pt verbalized understanding and PT continued to stress this throughout session.  Performed w/c mobility in controlled and simulated home environment at S level using R hemi technique.  Continue to provide min cues for avoiding obstacles on the L.  Performed car transfer to simulated home vehicle height at S level with max verbal cues for set up and where gf should be to assist.  Performed transfer to bed and and bed mobility in ADL apt at S level, however he states his bed is very high at home.  Therefore assisted into therapy gym and elevated mat to highest point for pt to transfer to.  He was able to transfer at S level, however will need assist to prevent forward slide off EOB or could have gf place step stool under LEs while scooting back onto bed, however he will still need assist with this at home.  Pt verbalized understanding.  Performed gait x 75' in hallway with hemi walker and L AFO at min/mod A level.  Continues to need max verbal cues for sequencing throughout, facilitation for upright posture, R trunk rotation and placement of LLE due to adductor tone.  Then trialed use of L PFRW at continued min/mod A  level.  Same facilitation but with better ability to assist trunk rotation.  Ended session with stair negotiation, up/down 5 steps with R handrail at mod A level and continued cues for safety and sequencing.  Pt assisted back to room and left in w/c with quick release belt donned and all needs in reach.   PT Discharge Precautions/Restrictions Precautions Precautions: Fall Precaution Comments: L  inattention, brace on L ankle from recent fx Restrictions Weight Bearing Restrictions: No  Pain Pain Assessment Pain Assessment: No/denies pain Pain Score: 0-No pain Vision/Perception  Vision - Assessment Tracking/Visual Pursuits: Other (comment) (continues to have decreased smoothness with horizontal and vertical tracking, requires min verbal cues for head turns and eye shifts)  Cognition Overall Cognitive Status: Impaired/Different from baseline Arousal/Alertness: Awake/alert Orientation Level: Oriented X4 Attention: Selective Selective Attention: Impaired Selective Attention Impairment: Functional basic Memory: Impaired Memory Impairment: Decreased recall of new information;Retrieval deficit;Storage deficit Awareness: Impaired Awareness Impairment: Emergent impairment Problem Solving: Impaired Problem Solving Impairment: Functional basic Executive Function: Self Monitoring;Self Correcting;Reasoning Reasoning: Impaired Reasoning Impairment: Verbal basic;Functional basic Self Monitoring: Impaired Self Monitoring Impairment: Functional basic Self Correcting: Impaired Self Correcting Impairment: Functional basic Behaviors: Impulsive;Perseveration Safety/Judgment: Impaired Sensation Sensation Light Touch: Impaired Detail Light Touch Impaired Details: Impaired LUE;Impaired LLE Stereognosis: Not tested Hot/Cold: Not tested Hot/Cold Impaired Details: Impaired LUE;Impaired LLE Proprioception: Impaired Detail Proprioception Impaired Details: Impaired LUE;Impaired LLE Coordination Gross Motor Movements are Fluid and Coordinated: No Fine Motor Movements are Fluid and Coordinated: No Coordination and Movement Description: decreased strength and ROM in L UE/LE Heel Shin Test: unable to perform due to weakness Motor  Motor Motor: Hemiplegia;Abnormal tone;Abnormal postural alignment and control Motor - Discharge Observations: decreased strength and ROM in L LE, 0/5 throughout L  UE with increased tone in bicep and elbow, increased tone in L hamstrings and adductors  Mobility Bed Mobility Bed Mobility: Supine to Sit;Sit to Supine Supine to Sit: 5: Supervision Supine to Sit Details: Verbal cues for sequencing;Verbal cues for technique;Verbal cues for precautions/safety Sit to Supine: 5: Supervision Sit to Supine - Details: Verbal cues for sequencing;Verbal cues for technique;Verbal cues for precautions/safety Transfers Transfers: Yes Sit to Stand: 4: Min assist Sit to Stand Details: Verbal cues for sequencing;Verbal cues for technique;Verbal cues for precautions/safety;Manual facilitation for weight shifting Stand to Sit: 4: Min assist Stand to Sit Details (indicate cue type and reason): Verbal cues for sequencing;Verbal cues for technique;Verbal cues for precautions/safety;Manual facilitation for weight shifting Squat Pivot Transfers: 5: Supervision Squat Pivot Transfer Details: Verbal cues for sequencing;Verbal cues for technique;Verbal cues for precautions/safety;Verbal cues for safe use of DME/AE Locomotion  Ambulation Ambulation: Yes Ambulation/Gait Assistance: 4: Min assist;3: Mod assist Ambulation Distance (Feet): 75 Feet Assistive device: Hemi-walker Ambulation/Gait Assistance Details: Verbal cues for sequencing;Verbal cues for technique;Verbal cues for precautions/safety;Manual facilitation for weight shifting;Verbal cues for safe use of DME/AE;Verbal cues for gait pattern;Manual facilitation for placement;Manual facilitation for weight bearing Ambulation/Gait Assistance Details: Pt continues to require slightl placement of LLE during gait, however is able to advance LLE on his own with AFO.  Requires facilitation for upright posture throughout and max verbal cues for sequencing and technique throughout.   Gait Gait: Yes Gait Pattern: Impaired Gait Pattern: Step-to pattern;Step-through pattern;Decreased stride length;Decreased step length - right;Decreased  stance time - left;Decreased weight shift to left;Trunk flexed;Lateral trunk lean to left;Decreased dorsiflexion - left;Poor foot clearance - left;Narrow base of support;Trunk rotated posteriorly on left Stairs / Additional Locomotion Stairs: Yes Stairs Assistance: 3: Mod assist Stairs Assistance Details: Verbal  cues for sequencing;Verbal cues for technique;Verbal cues for precautions/safety;Manual facilitation for weight shifting;Verbal cues for safe use of DME/AE;Verbal cues for gait pattern Stair Management Technique: One rail Right;Step to pattern;Forwards Number of Stairs: 5 Height of Stairs: 6 Architect: Yes Wheelchair Assistance: 5: Investment banker, operational Details: Verbal cues for precautions/safety;Verbal cues for safe use of DME/AE Wheelchair Propulsion: Right upper extremity;Right lower extremity Wheelchair Parts Management: Supervision/cueing Distance: 150  Trunk/Postural Assessment  Cervical Assessment Cervical Assessment: Exceptions to [facility] Cervical Strength Overall Cervical Strength Comments: keeps head laterally flexed to the R Thoracic Assessment Thoracic Assessment: Within Functional Limits Lumbar Assessment Lumbar Assessment: Within Functional Limits Postural Control Postural Control: Deficits on evaluation Righting Reactions: delayed Protective Responses: delayed Postural Limitations: Has extreme trunk shortening on the R during functional activities and keeps trunk rotated to the L during gait.   Balance Balance Balance Assessed: Yes Static Sitting Balance Static Sitting - Balance Support: Feet supported Static Sitting - Level of Assistance: 6: Modified independent (Device/Increase time) Dynamic Sitting Balance Sitting balance - Comments: Stand by assist Static Standing Balance Static Standing - Balance Support: Right upper extremity supported;During functional activity Static Standing - Level of Assistance: 4: Min  assist Extremity Assessment      RLE Assessment RLE Assessment: Within Functional Limits LLE Assessment LLE Assessment: Exceptions to [facility] LLE Strength LLE Overall Strength: Deficits Left Hip Flexion: 3-/5 Left Hip Extension: 3-/5 Left Knee Flexion: 1/5 Left Knee Extension: 2-/5 Left Ankle Dorsiflexion: 0/5 LLE Tone LLE Tone: Modified Ashworth Modified Ashworth Scale for Grading Hypertonia LLE: More marked increase in muscle tone through most of the ROM, but affected part(s) easily moved LLE Tone Comments: improved tone in L hamstrings due to botox injection, however now with increased adductor tone  See FIM for current functional status  Denice Bors 11/27/2014, 6:50 PM

## 2014-11-27 NOTE — Discharge Instructions (Signed)
Inpatient Rehab Discharge Instructions  Cory SleightStephen [name] Discharge date and time:  11/06/14 Activities/Precautions/ Functional Status: Activity: activity as tolerated Diet: cardiac diet Wound Care: none needed   Functional status:  ___ No restrictions     ___ Walk up steps independently ___ 24/7 supervision/assistance   ___ Walk up steps with assistance ___ Intermittent supervision/assistance  ___ Bathe/dress independently ___ Walk with walker     ___ Bathe/dress with assistance ___ Walk Independently    ___ Shower independently ___ Walk with assistance    ___ Shower with assistance ___ No alcohol     ___ Return to work/school ________  Special Instructions:     COMMUNITY REFERRALS UPON DISCHARGE:    Home Health:   PT, OT, RN, SW  Agency:[facility] Phone:[phone] Date of last service:11/28/2014  Medical Equipment/Items Ordered:WHEELCHAIR, TUB BENCH & BEDSIDE COMMODE  Agency/Supplier:[facility]    [phone]  Other:[facility] APPLICATION [phone]  MEDICAID PENDING- WORKER Maurine MinisterERESA GILLARD   [phone] [phone] [facility]   [phone] DOCTOR FOLLOW UP AND ASSIST WITH MEDICATIONS MATCH PROGRAM FOR ONE MONTH OF MEDICINES-GAVE TO PATIENT  GENERAL COMMUNITY RESOURCES FOR PATIENT/FAMILY: Support Groups:[facility] SUPPORT [facility]   My questions have been answered and I understand these instructions. I will adhere to these goals and the provided educational materials after my discharge from the hospital.  Patient/Caregiver Signature _______________________________ Date __________  Clinician Signature _______________________________________ Date __________  Please bring this form and your medication list with you to all your follow-up doctor's appointments.

## 2014-11-27 NOTE — Patient Care Conference (Signed)
Inpatient RehabilitationTeam Conference and Plan of Care Update Date: 11/27/2014   Time: 11;45 AM    Patient Name: Cory [name]      Medical Record Number: [id number]  Date of Birth: 09/07/1961 Sex: Male         Room/Bed: 4M08C/4M08C-01 Payor Info: Payor: MEDICAID PENDING / Plan: MEDICAID PENDING / Product Type: *No Product type* /    Admitting Diagnosis: R MCA INFARCT  Admit Date/Time:  10/18/2014  4:07 PM Admission Comments: No comment available   Primary Diagnosis:  Right middle cerebral artery stroke Principal Problem: Right middle cerebral artery stroke  Patient Active Problem List   Diagnosis Date Noted  . 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 Left-sided neglect 10/23/2014  . Alterations of sensations following CVA (cerebrovascular accident) 10/23/2014  . CVA (cerebral infarction) 10/18/2014  . Right middle cerebral artery stroke   . Dyslipidemia   . Acute CVA (cerebrovascular accident) 10/15/2014  . Stroke 10/15/2014  . Alcoholism 10/15/2014    Expected Discharge Date: Expected Discharge Date: 11/28/14  Team Members Present: Physician leading conference: Dr. Claudette LawsAndrew Kirsteins Social Worker Present: Dossie DerBecky Kevis Qu, LCSW Nurse Present: Carmie EndAngie Joyce, RN PT Present: Bayard Huggerebecca Varner, PT OT Present: Perrin MalteseJames McGuire, OT;Katie Raquel JamesPittman, OT;Jennifer Katrinka BlazingSmith, OT SLP Present: Fae PippinMelissa Bowie, SLP PPS Coordinator present : Tora DuckMarie Noel, RN, CRRN     Current Status/Progress Goal Weekly Team Focus  Medical   Impulsivity, poor awareness of deficits, severe left hemiparesis  Discharged to environment with 24 7 supervision  Discharge planning   Bowel/Bladder   Cont of bowel and bladder, LBM 11/24/14  Pt to remain continent of bowel and bladder  Assess bowel and bladder function q shift   Swallow/Nutrition/ Hydration   regular textures and thin liquids with full supervision   min assist with least restrictive diet   use of swallow straegies    ADL's   S for bed mobility, S for transfers wheelchair <> bed with mod-max verbal cues for safety, Mod A standing balance, UB dressing with verbal cues, LB dressing with Mod A, min A bathing  Min - Mod A overall  L NMR, pt education, safety awareness, L inattention, dynamic standing balance   Mobility   S bed mobility, S transfers still with mod to max verbal cues for safe set up and positioning, min to mod for standing activities and min/mod A gait with hemi walker  S transfers, mod A standing, max A therapy only gait goal  D/C planning, pt education   Communication   Supervision   supervision   continue to increase vocal intensity   Safety/Cognition/ Behavioral Observations  Min-Mod assist   min assist   continue to improve left attention and safety awareness, functional problem solving and attention    Pain   No complaints of pain  < 3  Assess qshift and PRN   Skin   dry and intact  CDI  Assess Q shift and PRN      *See Care Plan and progress notes for long and short-term goals.  Barriers to Discharge: Limited family and caregiver support    Possible Resolutions to Barriers:  Explore caregiver options, SNF not an option secondary to registered sex offender status    Discharge Planning/Teaching Needs:  Plan to go to girlfriend's home and function from wheelchair level. Making discharge arrangements      Team Discussion:  Slow progress-supervision transfers-wheelchair level recommended at home. Inattention to left and impulsive still. Home health follow up to continue to  work on ambulation and deficits.   Revisions to Treatment Plan:  Plan now home with girlfriend tomorrow   Continued Need for Acute Rehabilitation Level of Care: The patient requires daily medical management by a physician with specialized training in physical medicine and rehabilitation for the following conditions: Daily direction of a multidisciplinary physical rehabilitation program to ensure safe treatment while  eliciting the highest outcome that is of practical value to the patient.: Yes Daily medical management of patient stability for increased activity during participation in an intensive rehabilitation regime.: Yes Daily analysis of laboratory values and/or radiology reports with any subsequent need for medication adjustment of medical intervention for : Neurological problems;Other  Cory [name], Cory [name] 11/28/2014, 1:48 PM

## 2014-11-28 NOTE — Progress Notes (Signed)
Social Work Discharge Note Discharge Note  The overall goal for the admission was met for:   Discharge location: Yes-[facility]  Length of Stay: No-41 DAYS  Discharge activity level: Yes-SUPERVISION/MIN LEVEL  Home/community participation: Yes  Services provided included: MD, RD, PT, OT, SLP, RN, CM, TR, Pharmacy, Neuropsych and SW  Financial Services: Other: [employer]  Follow-up services arranged: Home Health: [facility]-PT,OT,RN,SW, DME: [facility]-WHEELCHAIR, BEDSIDE COMMODE, TUB BENCH and Patient/Family has no preference for HH/DME agencies  Comments (or additional information):PLAN TO GO TO [facility] HOME HE IS AWARE SHE IS MOVING IN TWO WEEKS AND IS MAKING OTHER ARRANGEMENTS FOR HIMSELF.  UNSURE IF HE IS MOVING WITH HER OR STAYING IN [place]. MATCH MEDICINES GIVEN TO PT FOR ONE MONTH HE WILL  [facility]. DISABILITY AND MEDICAID APPLICATIONS ARE PENDING. DAUGHTER AND SISTER ARE AWARE OF THIS PLAN. HAVE CONTACTED OFFICER DWAYNE HARRIS-PT'S PROBATION OFFICER TO INFORM HIM OF PT'S DISCHARGE  AND THE ADDRESS HE IS RETURNING TO.  Patient/Family verbalized understanding of follow-up arrangements: Yes  Individual responsible for coordination of the follow-up plan: SELF & LYNETTE-GIRLFRIEND, ALONG WITH TYISHA-DAUGHTER  Confirmed correct DME delivered: Elease Hashimoto 11/28/2014    Elease Hashimoto

## 2014-11-28 NOTE — Progress Notes (Signed)
11/28/14 1625 nsg Patient discharged to home per ambulance.

## 2014-11-28 NOTE — Progress Notes (Signed)
[facility]     PROGRESS NOTE    Subjective/Complaints: No ankle pain Slept ok    ROS denies pain or breathing problems  Objective: Vital Signs: Blood pressure 133/88, pulse 75, temperature 97.6 F (36.4 C), temperature source Oral, resp. rate 18, weight 74.889 kg (165 lb 1.6 oz), SpO2 100 %. Dg Ankle 2 Views Left  11/26/2014   CLINICAL DATA:  Tenderness left ankle. No injury. Ankle fracture October 2015.  EXAM: LEFT ANKLE - 2 VIEW  COMPARISON:  10/24/2014  FINDINGS: Examination demonstrates evidence of interval healing of patient's left distal fibular fracture without significant residual lucency at the fracture site. Ankle mortise is intact. Remainder of the exam is unchanged.  IMPRESSION: Interval healing distal fibular fracture.  No acute injury.   Electronically Signed   By: Elberta Fortis M.D.   On: 11/26/2014 10:04   No results for input(s): WBC, HGB, HCT, PLT in the last 72 hours.  Recent Labs  11/27/14 0623  NA 139  K 4.1  CL 107  GLUCOSE 95  BUN 10  CREATININE 0.75  0.78  CALCIUM 9.1   CBG (last 3)  No results for input(s): GLUCAP in the last 72 hours.  Wt Readings from Last 3 Encounters:  11/27/14 74.889 kg (165 lb 1.6 oz)  10/15/14 79.289 kg (174 lb 12.8 oz)  09/03/14 86.183 kg (190 lb)    Physical Exam:  Constitutional: He is oriented to person, place, and time. He appears well-developed and well-nourished.  HENT:  Head: Normocephalic and atraumatic.  Eyes: Conjunctivae are normal. t.   Neck: Normal range of motion. Neck supple.  Cardiovascular: Normal rate and regular rhythm.  Respiratory: Effort normal and breath sounds normal. No respiratory distress. He has no wheezes.  GI: Soft. Bowel sounds are normal.  Musculoskeletal: no pain with ankle ROM Neurological: He is alert and oriented to person, place, and time. Delayed processing.  Left facial weakness with dysarthria and left inattention.   Has poor insight and  awareness of deficits and easily distracted . Motor strength 0/5 in left deltoid, biceps, triceps, 0/5 to?tr grip, 2-hip flexor, 3- knee extensor, 0 ankle dorsiflexion plantar flexion MAS 1 Left hamstrings, MAS 3 in L Hip Add, MAS 1-2 in Quad 5/5 on the right side in the same muscle groups.   Skin: Skin is warm and dry.    Assessment/Plan: 1. Functional deficits secondary to right MCA infarct, Medically stable for hospital d/c Requires 24/7 sup. Poor awareness of deficits, left neglect, poor balance and Left hemiparesis with spasticity        Rec HHPT, OT, SLP and aide D/C to girlfriend's place See d/c summ Discussed no driving , no ETOH, no smoking  Will need PCP, community wellness  FIM: FIM - Bathing Bathing Steps Patient Completed: Chest, Abdomen, Front perineal area, Right upper leg, Left upper leg, Left Arm, Right lower leg (including foot), Left lower leg (including foot), Buttocks Bathing: 4: Min-Patient completes 8-9 17f 10 parts or 75+ percent  FIM - Upper Body Dressing/Undressing Upper body dressing/undressing steps patient completed: Thread/unthread right sleeve of pullover shirt/dresss, Put head through opening of pull over shirt/dress, Pull shirt over trunk Upper body dressing/undressing: 4: Min-Patient completed 75 plus % of tasks FIM - Lower Body Dressing/Undressing Lower body dressing/undressing steps patient completed: Thread/unthread left underwear leg, Thread/unthread right underwear leg, Thread/unthread right pants leg, Thread/unthread left pants leg, Don/Doff right sock, Don/Doff right shoe Lower body dressing/undressing: 3: Mod-Patient completed 50-74% of  tasks  FIM - Toileting Toileting steps completed by patient: Performs perineal hygiene Toileting Assistive Devices: Grab bar or rail for support Toileting: 2: Max-Patient completed 1 of 3 steps  FIM - Diplomatic Services operational officerToilet Transfers Toilet Transfers Assistive Devices: Grab bars Toilet Transfers: 4-To toilet/BSC: Min A  (steadying Pt. > 75%)  FIM - Bed/Chair Transfer Bed/Chair Transfer Assistive Devices: Arm rests Bed/Chair Transfer: 5: Supine > Sit: Supervision (verbal cues/safety issues), 5: Sit > Supine: Supervision (verbal cues/safety issues), 5: Bed > Chair or W/C: Supervision (verbal cues/safety issues), 5: Chair or W/C > Bed: Supervision (verbal cues/safety issues)  FIM - Locomotion: Wheelchair Distance: 150 Locomotion: Wheelchair: 5: Travels 150 ft or more: maneuvers on rugs and over door sills with supervision, cueing or coaxing FIM - Locomotion: Ambulation Locomotion: Ambulation Assistive Devices: Orthosis, Environmental consultantWalker - Hemi Ambulation/Gait Assistance: 4: Min assist, 3: Mod assist Locomotion: Ambulation: 2: Travels 50 - 149 ft with moderate assistance (Pt: 50 - 74%)  Comprehension Comprehension Mode: Auditory Comprehension: 5-Understands basic 90% of the time/requires cueing < 10% of the time  Expression Expression Mode: Verbal Expression Assistive Devices: 6-Talk trach valve Expression: 5-Expresses basic needs/ideas: With no assist  Social Interaction Social Interaction Mode: Asleep Social Interaction: 4-Interacts appropriately 75 - 89% of the time - Needs redirection for appropriate language or to initiate interaction.  Problem Solving Problem Solving Mode: Not assessed Problem Solving: 3-Solves basic 50 - 74% of the time/requires cueing 25 - 49% of the time  Memory Memory Mode: Asleep Memory Assistive Devices: Memory book Memory: 3-Recognizes or recalls 50 - 74% of the time/requires cueing 25 - 49% of the time  Medical Problem List and Plan: 1. Functional deficits secondary to R-MCA infarct question embolic v/s thrombotic with resultant left hemiplegia with sensory deficits, left facial weakness, left neglect and dysphagia. 2. DVT Prophylaxis/Anticoagulation: Pharmaceutical:D/C Lovenox 3. Pain Management: tylenol for h/a for now.  topamax for post stroke headache, improved will  D/C 4. Mood: Seems withdrawn--will monitor for now. LCSW to follow for support and evaluation.  5. Neuropsych: This patient is not capable of making decisions on his own behalf. 6. Skin/Wound Care: Routine pressure relief measures.  7. Fluids/Electrolytes/Nutrition: Monitor I/O. Follow up admission labs tomorrow 8. HTN: 9. H/o Alcohol abuse/Tobacco use: May d/c CIWA protocol. No signs of WD for several weeks 10. Dyslipidemia:  11.  Hx subacute ankle fx ~12wks ago, healed , ortho f/u if pain recurs   12.  Spasticity increasing primarily L hamstring, start zanaflex, monitor BP,  Botulinum to Hamstrings helpful , also with  Adductor and quad  spasticity       LOS (Days) 41 A FACE TO FACE EVALUATION WAS PERFORMED  KIRSTEINS,ANDREW E 11/28/2014 7:05 AM

## 2014-11-28 NOTE — Progress Notes (Signed)
Social Work Patient ID: Cory [name], male   DOB: May 15, 1961, 54 y.o.   MRN: [id number] Met with pt to see if equipment was delivered last night, he reports it was.  He feels ready to go home today and feels his questions have been answered. Gave him the months worth of medicines and he will follow up with the [facility] for PCP and medication assistance.  Aware have arranged ambulance for 3;30 to make sure Willette Cluster is home from work by then.  Pt states: " Don't pay attention to my daughter she is just  looking out for her Daddy."  He states: " You all have done all you can for me here now it is my turn with follow up therapies."  Work toward discharge today scheduled ambulance for 3;30 pick up.

## 2014-11-28 NOTE — Progress Notes (Signed)
Social Work Patient ID: Cory SleightStephen [name], male   DOB: 11/16/1961, 54 y.o.   MRN: [id number] Spoke with Lynette-pt's girlfriend to inform ambulance here and he will be on his way shortly.  She received the equipment last night and is ready for him to come home. She hopes he will listen to her and do what he is suppose to do, but knows him well and is doubtful of this. She had no other questions but will call if any arise.

## 2014-11-28 NOTE — Progress Notes (Addendum)
Recreational Therapy Discharge Summary Patient Details  Name: Masashi Snowdon MRN: [id number] Date of Birth: October 08, 1961 Today's Date: 11/28/2014  Long term goals set: 1  Long term goals met: 1  Comments on progress toward goals: Pt is discharging home with local girlfriend today for 2 weeks at supervision w/c level.  Girlfriend is unable to provide the reccommeded supervision, but pt has no other options for discharge destination at this time. Pt is discharging at set up assist level for simple TR tasks w/c level and supervision-min assist with verbal cuing for more complex tasks seated w/c level.  Reasons for discharge: discharge from hospital  Patient/family agrees with progress made and goals achieved: Yes  Ronne Stefanski 11/28/2014, 9:14 AM

## 2014-11-28 NOTE — Progress Notes (Signed)
Social Work Patient ID: Naida SleightStephen Melvin, male   DOB: 10-24-1961, 54 y.o.   MRN: [id number] Message also left for Haywood LassoLynette if further questions or concerns to contact me.

## 2014-11-28 NOTE — Progress Notes (Signed)
11/28/14 1336 nsg Per Social Work patient will be discharged to home per ambulance. All paper works are ready. Discharged instructions discussed by PA. RN followed up discharged instructions no further questions.Waiting for ambulance patient is ready.

## 2014-11-29 ENCOUNTER — Telehealth: Payer: Self-pay | Admitting: Cardiology

## 2014-11-29 ENCOUNTER — Encounter: Payer: Self-pay | Admitting: Cardiology

## 2014-11-29 NOTE — Telephone Encounter (Signed)
LMOVM requesting that pt send manual transmission b/c home monitor has been disconnected.   

## 2014-12-16 ENCOUNTER — Inpatient Hospital Stay: Payer: Self-pay | Admitting: Physical Medicine & Rehabilitation

## 2014-12-16 ENCOUNTER — Encounter: Payer: Self-pay | Attending: Physical Medicine & Rehabilitation

## 2014-12-16 DIAGNOSIS — I10 Essential (primary) hypertension: Secondary | ICD-10-CM | POA: Diagnosis not present

## 2014-12-16 DIAGNOSIS — I69392 Facial weakness following cerebral infarction: Secondary | ICD-10-CM | POA: Diagnosis not present

## 2014-12-16 DIAGNOSIS — F1020 Alcohol dependence, uncomplicated: Secondary | ICD-10-CM

## 2014-12-16 DIAGNOSIS — I69354 Hemiplegia and hemiparesis following cerebral infarction affecting left non-dominant side: Secondary | ICD-10-CM | POA: Diagnosis not present

## 2014-12-16 DIAGNOSIS — E785 Hyperlipidemia, unspecified: Secondary | ICD-10-CM | POA: Diagnosis not present

## 2014-12-20 ENCOUNTER — Telehealth: Payer: Self-pay | Admitting: Cardiology

## 2014-12-20 NOTE — Telephone Encounter (Signed)
Spoke w/ pt and he will send transmission today.

## 2014-12-20 NOTE — Telephone Encounter (Signed)
LMOVM for pt to return call 

## 2014-12-30 ENCOUNTER — Emergency Department (HOSPITAL_COMMUNITY)
Admission: EM | Admit: 2014-12-30 | Discharge: 2014-12-31 | Disposition: A | Discharge status: Home / Self Care | Payer: Medicaid Other | Attending: Emergency Medicine | Admitting: Emergency Medicine

## 2014-12-30 ENCOUNTER — Emergency Department (HOSPITAL_COMMUNITY): Payer: Medicaid Other

## 2014-12-30 ENCOUNTER — Encounter (HOSPITAL_COMMUNITY): Payer: Self-pay | Admitting: Emergency Medicine

## 2014-12-30 DIAGNOSIS — Z87891 Personal history of nicotine dependence: Secondary | ICD-10-CM | POA: Diagnosis not present

## 2014-12-30 DIAGNOSIS — R7989 Other specified abnormal findings of blood chemistry: Secondary | ICD-10-CM

## 2014-12-30 DIAGNOSIS — Y998 Other external cause status: Secondary | ICD-10-CM | POA: Diagnosis not present

## 2014-12-30 DIAGNOSIS — W19XXXA Unspecified fall, initial encounter: Secondary | ICD-10-CM

## 2014-12-30 DIAGNOSIS — W1812XA Fall from or off toilet with subsequent striking against object, initial encounter: Secondary | ICD-10-CM | POA: Insufficient documentation

## 2014-12-30 DIAGNOSIS — Z79899 Other long term (current) drug therapy: Secondary | ICD-10-CM | POA: Diagnosis not present

## 2014-12-30 DIAGNOSIS — S0083XA Contusion of other part of head, initial encounter: Secondary | ICD-10-CM | POA: Insufficient documentation

## 2014-12-30 DIAGNOSIS — Z7982 Long term (current) use of aspirin: Secondary | ICD-10-CM | POA: Diagnosis not present

## 2014-12-30 DIAGNOSIS — Y9289 Other specified places as the place of occurrence of the external cause: Secondary | ICD-10-CM | POA: Diagnosis not present

## 2014-12-30 DIAGNOSIS — S40012A Contusion of left shoulder, initial encounter: Secondary | ICD-10-CM | POA: Diagnosis not present

## 2014-12-30 DIAGNOSIS — Z8673 Personal history of transient ischemic attack (TIA), and cerebral infarction without residual deficits: Secondary | ICD-10-CM | POA: Insufficient documentation

## 2014-12-30 DIAGNOSIS — Y9389 Activity, other specified: Secondary | ICD-10-CM | POA: Diagnosis not present

## 2014-12-30 DIAGNOSIS — S0093XA Contusion of unspecified part of head, initial encounter: Secondary | ICD-10-CM

## 2014-12-30 DIAGNOSIS — S0990XA Unspecified injury of head, initial encounter: Secondary | ICD-10-CM | POA: Diagnosis present

## 2014-12-30 HISTORY — DX: Cerebral infarction, unspecified: I63.9

## 2014-12-30 LAB — CBC WITH DIFFERENTIAL/PLATELET
BASOS PCT: 0 % (ref 0–1)
Basophils Absolute: 0 10*3/uL (ref 0.0–0.1)
EOS ABS: 0.1 10*3/uL (ref 0.0–0.7)
EOS PCT: 2 % (ref 0–5)
HEMATOCRIT: 40.9 % (ref 39.0–52.0)
Hemoglobin: 13.4 g/dL (ref 13.0–17.0)
LYMPHS ABS: 1.9 10*3/uL (ref 0.7–4.0)
Lymphocytes Relative: 34 % (ref 12–46)
MCH: 26.8 pg (ref 26.0–34.0)
MCHC: 32.8 g/dL (ref 30.0–36.0)
MCV: 81.8 fL (ref 78.0–100.0)
MONOS PCT: 9 % (ref 3–12)
Monocytes Absolute: 0.5 10*3/uL (ref 0.1–1.0)
NEUTROS PCT: 55 % (ref 43–77)
Neutro Abs: 3.1 10*3/uL (ref 1.7–7.7)
Platelets: 249 10*3/uL (ref 150–400)
RBC: 5 MIL/uL (ref 4.22–5.81)
RDW: 12.6 % (ref 11.5–15.5)
WBC: 5.7 10*3/uL (ref 4.0–10.5)

## 2014-12-30 LAB — URINALYSIS, ROUTINE W REFLEX MICROSCOPIC
Glucose, UA: NEGATIVE mg/dL
Hgb urine dipstick: NEGATIVE
Ketones, ur: 15 mg/dL — AB
Leukocytes, UA: NEGATIVE
Nitrite: NEGATIVE
PH: 6.5 (ref 5.0–8.0)
PROTEIN: 30 mg/dL — AB
Specific Gravity, Urine: 1.034 — ABNORMAL HIGH (ref 1.005–1.030)
UROBILINOGEN UA: 1 mg/dL (ref 0.0–1.0)

## 2014-12-30 LAB — BASIC METABOLIC PANEL
Anion gap: 5 (ref 5–15)
BUN: 13 mg/dL (ref 6–23)
CHLORIDE: 101 mmol/L (ref 96–112)
CO2: 30 mmol/L (ref 19–32)
Calcium: 9.1 mg/dL (ref 8.4–10.5)
Creatinine, Ser: 0.76 mg/dL (ref 0.50–1.35)
GFR calc Af Amer: >90 mL/min (ref >90)
GFR calc non Af Amer: >90 mL/min (ref >90)
Glucose, Bld: 105 mg/dL — ABNORMAL HIGH (ref 70–99)
Potassium: 3.8 mmol/L (ref 3.5–5.1)
Sodium: 136 mmol/L (ref 135–145)

## 2014-12-30 LAB — URINE MICROSCOPIC-ADD ON

## 2014-12-30 LAB — TROPONIN I: Troponin I: <0.03 ng/mL (ref <0.031)

## 2014-12-30 LAB — D-DIMER, QUANTITATIVE: D-Dimer, Quant: 2.17 ug/mL-FEU — ABNORMAL HIGH (ref 0.00–0.48)

## 2014-12-30 MED ORDER — PANTOPRAZOLE SODIUM 20 MG PO TBEC: 20.0000 mg | DELAYED_RELEASE_TABLET | Freq: Every day | ORAL | Status: AC

## 2014-12-30 MED ORDER — IOHEXOL 350 MG/ML SOLN
80.0000 mL | Freq: Once | INTRAVENOUS | Status: AC | PRN
  Administered 2014-12-30: 80 mL via INTRAVENOUS

## 2014-12-30 MED ORDER — ATORVASTATIN CALCIUM 20 MG PO TABS: 20.0000 mg | ORAL_TABLET | Freq: Every day | ORAL | Status: AC

## 2014-12-30 MED ORDER — ASPIRIN 325 MG PO TABS: 325.0000 mg | ORAL_TABLET | Freq: Once | ORAL | Status: AC

## 2014-12-30 NOTE — Progress Notes (Signed)
ED CM reviewed record and noted patient to be without PCP or health insurance. [facility] with c/o falling off the commode he felt dizzy and fell to the ground striking his head and left shoulder. He came in with c-collar in place. PMH CVA 12/15 with left hemiplegia in w/c, [facility] and discharged last month. Patient [facility].Discussed [facility] for establishing care, and [facility], patient is agreeable. Patient verbalized understanding.  Contacted Clinic Administrator scheduled clinic visit for Wed. 2/10 at 2p, He should bring his  Prescriptions, patient agreeable. Spoke with Administrator concerning transportation someone will contact him from clinic regarding possible transportation tomorrow.  Patient now reports that his family has left him and moved back to [place]. Patient states, he is staying with some friends, ED CSW was also consulted. [facility] he has no where to go upon discharge. ED CSW provided homeless resources.  Discussed with [facility] Mad[facility] and [facility] they are agreeable.

## 2014-12-30 NOTE — ED Provider Notes (Signed)
CSN: [id number]     Arrival date & time 12/30/14  1618 History   First MD Initiated Contact with Patient 12/30/14 1623     Chief Complaint  Patient presents with  . Fall     Patient is a 54 y.o. male presenting with fall. The history is provided by the patient. No language interpreter was used.  Fall   Mr. Santana presents for evaluation of injuries following a fall. He states when he went to get up off the commode he felt dizzy and fell to the ground striking his head and left shoulder. He came in with c-collar in place by EMS. He denies any loss of consciousness. 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 He states the dizziness is uncommon for him but he feels like he got dizzy from standing up too fast. He also reports intermittent left-sided pleuritic chest pain over the last week. He denies any recent illnesses. He denies fevers, vomiting, shortness of breath, abdominal pain, diarrhea, dysuria, leg swelling.   Past Medical History  Diagnosis Date  . Medical history non-contributory   . Stroke    Past Surgical History  Procedure Laterality Date  . No past surgeries    . Tee without cardioversion N/A 10/16/2014    Procedure: TRANSESOPHAGEAL ECHOCARDIOGRAM (TEE);  Surgeon: Donato Schultz, MD;  Location: [facility] ENDOSCOPY;  Service: Cardiovascular;  Laterality: N/A;  . Loop recorder implant N/A 10/18/2014    Procedure: LOOP RECORDER IMPLANT;  Surgeon: Duke Salvia, MD;  Location: [facility] CATH LAB;  Service: Cardiovascular;  Laterality: N/A;   Family History  Problem Relation Age of Onset  . Stroke Mother   . Stroke Sister    History  Substance Use Topics  . Smoking status: Former Games developer  . Smokeless tobacco: Not on file  . Alcohol Use: Yes    Review of Systems  All other systems reviewed and are negative.     Allergies  Review of patient's allergies indicates no known allergies.  Home  Medications   Prior to Admission medications   Medication Sig Start Date End Date Taking? Authorizing Provider  aspirin 325 MG tablet Take 1 tablet (325 mg total) by mouth daily. 11/27/14   Jacquelynn Cree, PA-C  atorvastatin (LIPITOR) 20 MG tablet Take 1 tablet (20 mg total) by mouth daily at 6 PM. 11/27/14   Evlyn Kanner Love, PA-C  pantoprazole (PROTONIX) 20 MG tablet Take 1 tablet (20 mg total) by mouth daily. 11/28/14   Evlyn Kanner Love, PA-C  senna-docusate (SENOKOT-S) 8.6-50 MG per tablet Take 2 tablets by mouth at bedtime. 11/27/14   Jacquelynn Cree, PA-C  thiamine 100 MG tablet Take 1 tablet (100 mg total) by mouth daily. 10/18/14   Shanker Levora Dredge, MD  tiZANidine (ZANAFLEX) 2 MG tablet Take 1 tablet (2 mg total) by mouth 3 (three) times daily. 11/27/14   Evlyn Kanner Love, PA-C   BP 133/96 mmHg  Pulse 82  Temp(Src) 98.5 F (36.9 C) (Oral)  Resp 14  Ht  (1.651 m)  Wt 165 lb (74.844 kg)  BMI 27.46 kg/m2  SpO2 100% Physical Exam  Constitutional: He is oriented to person, place, and time. He appears well-developed and well-nourished.  HENT:  Head: Normocephalic and atraumatic.  Eyes: Pupils are equal, round, and reactive to light.  Cardiovascular: Normal rate and regular rhythm.   No murmur heard. Pulmonary/Chest: Effort normal and  breath sounds normal. No respiratory distress.  Abdominal: Soft. There is no tenderness. There is no rebound and no guarding.  Musculoskeletal: He exhibits no edema.  Tenderness to palpation over the left upper shoulder, patient is unable to range the joint due to pre-existing weakness.  2+ radial pulses bilaterally  Neurological: He is alert and oriented to person, place, and time.  There is left-sided facial droop, 4 out of 5 strength in the left upper extremity, 3-4 out of 5 strength in the left lower extremity.  Skin: Skin is warm and dry.  Psychiatric: He has a normal mood and affect. His behavior is normal.  Nursing note and vitals reviewed.   ED Course   Procedures (including critical care time) Labs Review Labs Reviewed  BASIC METABOLIC PANEL - Abnormal; Notable for the following:    Glucose, Bld 105 (*)    All other components within normal limits  D-DIMER, QUANTITATIVE - Abnormal; Notable for the following:    D-Dimer, Quant 2.17 (*)    All other components within normal limits  URINALYSIS, ROUTINE W REFLEX MICROSCOPIC - Abnormal; Notable for the following:    Color, Urine AMBER (*)    APPearance CLOUDY (*)    Specific Gravity, Urine 1.034 (*)    Bilirubin Urine SMALL (*)    Ketones, ur 15 (*)    Protein, ur 30 (*)    All other components within normal limits  URINE MICROSCOPIC-ADD ON - Abnormal; Notable for the following:    Crystals CA OXALATE CRYSTALS (*)    All other components within normal limits  CBC WITH DIFFERENTIAL/PLATELET  TROPONIN I    Imaging Review Dg Chest 2 View  12/30/2014   CLINICAL DATA:  Fall.  Anterior chest pain and LEFT shoulder pain.  EXAM: CHEST  2 VIEW  COMPARISON:  10/15/2014.  FINDINGS: Cardiopericardial silhouette within normal limits. Mediastinal contours normal. Trachea midline. No airspace disease or effusion. Partially translucent monitoring buttons project over the chest. Event recorder projects over the LEFT side of the heart.  No displaced rib fractures.  No pneumothorax.  IMPRESSION: No active cardiopulmonary disease.   Electronically Signed   By: Andreas Newport M.D.   On: 12/30/2014 18:15   Ct Head Wo Contrast  12/30/2014   CLINICAL DATA:  Patient became dizzy and fell out of wheelchair. Head and neck pain.  EXAM: CT HEAD WITHOUT CONTRAST  CT CERVICAL SPINE WITHOUT CONTRAST  TECHNIQUE: Multidetector CT imaging of the head and cervical spine was performed following the standard protocol without intravenous contrast. Multiplanar CT image reconstructions of the cervical spine were also generated.  COMPARISON:  Head CT October 16, 2014  FINDINGS: CT HEAD FINDINGS  There is slight generalized  atrophy with mild ex vacuo phenomenon involving the right lateral ventricle. There is no intracranial mass, hemorrhage, extra-axial fluid collection, or midline shift. There is evidence of a prior infarct involving most of the right lentiform nucleus as well as the right external and extreme capsules. There is a nearby prior infarct at the right frontal -temporal junction superiorly. There is also infarct involving much of the right temporal lobe as well as the superior right temporal -occipital junction. A small amount of infarct is seen extending into the right parietal lobe. These infarcts are chronic and represent evolution of infarction seen on prior CT. Elsewhere gray-white compartments appear essentially unremarkable. No acute infarct is seen. The bony calvarium appears intact. The mastoid air cells are clear.  CT CERVICAL SPINE FINDINGS  There is  no fracture or spondylolisthesis. Prevertebral soft tissues and predental space regions are normal. There is fairly severe disc space narrowing at C6-7. There is moderate disc space narrowing at C3-4, C5-6, and C7-T1. There is mild narrowing at other levels. There is moderate facet hypertrophy at C2-3 on the left. There is somewhat milder facet hypertrophy at most other levels bilaterally. There is no frank disc extrusion or stenosis. There is moderate exit foraminal narrowing due at several levels without nerve root edema or effacement appreciable.  IMPRESSION: CT head: Multiple areas of right-sided infarction as summarized above. Mild ex vacuo phenomenon right lateral ventricle. No acute infarct seen. No hemorrhage or mass effect.  CT cervical spine: Multilevel osteoarthritic change. No fracture or spondylolisthesis.   Electronically Signed   By: Bretta Bang M.D.   On: 12/30/2014 18:31   Ct Angio Chest Pe W/cm &/or Wo Cm  12/30/2014   CLINICAL DATA:  Elevated D-dimer. Loop recorder. Fall today after dizziness.  EXAM: CT ANGIOGRAPHY CHEST WITH CONTRAST   TECHNIQUE: Multidetector CT imaging of the chest was performed using the standard protocol during bolus administration of intravenous contrast. Multiplanar CT image reconstructions and MIPs were obtained to evaluate the vascular anatomy.  CONTRAST:  80mL OMNIPAQUE IOHEXOL 350 MG/ML SOLN  COMPARISON:  12/30/2014  FINDINGS: No filling defect is identified in the pulmonary arterial tree to suggest pulmonary embolus. The appearance in the right lower lobe on image 118 of series 8 of possible tiny subsegmental embolus is shown on other planes to be volume averaging.  Atherosclerotic intimal thickening in the thoracic aorta without compelling findings of dissection, although aortic contrast opacification is not ideal due to the pulmonary arterial focus of today's exam. No pericardial effusion. Mild cardiomegaly. No pathologic thoracic adenopathy.  Subsegmental atelectasis in the posterior basal segment right lower lobe.  No thoracic compression fracture or other significant musculoskeletal abnormality is observed.  Review of the MIP images confirms the above findings.  IMPRESSION: 1. No filling defect is identified in the pulmonary arterial tree to suggest pulmonary embolus. 2. Atherosclerosis and mild cardiomegaly. 3. Subsegmental atelectasis, posterior basal segment right lower lobe.   Electronically Signed   By: Herbie Baltimore M.D.   On: 12/30/2014 20:03   Ct Cervical Spine Wo Contrast  12/30/2014   CLINICAL DATA:  Patient became dizzy and fell out of wheelchair. Head and neck pain.  EXAM: CT HEAD WITHOUT CONTRAST  CT CERVICAL SPINE WITHOUT CONTRAST  TECHNIQUE: Multidetector CT imaging of the head and cervical spine was performed following the standard protocol without intravenous contrast. Multiplanar CT image reconstructions of the cervical spine were also generated.  COMPARISON:  Head CT October 16, 2014  FINDINGS: CT HEAD FINDINGS  There is slight generalized atrophy with mild ex vacuo phenomenon involving the  right lateral ventricle. There is no intracranial mass, hemorrhage, extra-axial fluid collection, or midline shift. There is evidence of a prior infarct involving most of the right lentiform nucleus as well as the right external and extreme capsules. There is a nearby prior infarct at the right frontal -temporal junction superiorly. There is also infarct involving much of the right temporal lobe as well as the superior right temporal -occipital junction. A small amount of infarct is seen extending into the right parietal lobe. These infarcts are chronic and represent evolution of infarction seen on prior CT. Elsewhere gray-white compartments appear essentially unremarkable. No acute infarct is seen. The bony calvarium appears intact. The mastoid air cells are clear.  CT CERVICAL SPINE  FINDINGS  There is no fracture or spondylolisthesis. Prevertebral soft tissues and predental space regions are normal. There is fairly severe disc space narrowing at C6-7. There is moderate disc space narrowing at C3-4, C5-6, and C7-T1. There is mild narrowing at other levels. There is moderate facet hypertrophy at C2-3 on the left. There is somewhat milder facet hypertrophy at most other levels bilaterally. There is no frank disc extrusion or stenosis. There is moderate exit foraminal narrowing due at several levels without nerve root edema or effacement appreciable.  IMPRESSION: CT head: Multiple areas of right-sided infarction as summarized above. Mild ex vacuo phenomenon right lateral ventricle. No acute infarct seen. No hemorrhage or mass effect.  CT cervical spine: Multilevel osteoarthritic change. No fracture or spondylolisthesis.   Electronically Signed   By: Bretta BangWilliam  Woodruff M.D.   On: 12/30/2014 18:31   Dg Shoulder Left  12/30/2014   CLINICAL DATA:  Lost balance, acute fall, anterior left shoulder and chest pain  EXAM: LEFT SHOULDER - 2+ VIEW  COMPARISON:  12/30/2014  FINDINGS: There is no evidence of fracture or  dislocation. There is no evidence of arthropathy or other focal bone abnormality. Soft tissues are unremarkable. Cardiac recorder device noted.  IMPRESSION: No acute osseous finding.   Electronically Signed   By: Ruel Favorsrevor  Shick M.D.   On: 12/30/2014 18:13     EKG Interpretation   Date/Time:  Monday December 30 2014 16:36:08 EST Ventricular Rate:  82 PR Interval:  168 QRS Duration: 72 QT Interval:  379 QTC Calculation: 443 R Axis:   40 Text Interpretation:  Sinus rhythm T wave inversion and ST depression in  II, III, aVF, V5-6 present on prior EKG 10/15/14 Confirmed by Lincoln Brighamees, Liz  [phone]([id number]) on 12/30/2014 4:48:20 PM      MDM   Final diagnoses:  Fall, initial encounter  Head contusion, initial encounter  Shoulder contusion, left, initial encounter   patient here for evaluation of injuries following a fall. Patient reports that the fall happened after standing up too quickly and he had a dizzy spell. There is no evidence of acute bacterial infection. D-dimer sent because of patient's history of pleuritic chest pain, d-dimer was elevated and CT PE study was obtained which was negative for acute PE. There is no evidence of acute fracture or serious injury related to his fall.  Case management consultation regarding availability of home health services. Clinical picture is not consistent with ACS or acute CVA. Discussed with patient outpatient follow-up, arrangements were made through case management. Patient without any documented hypoxia during his emergency department stay, feel oxygen sat of 87% at discharge was charted erroneously.   Tilden FossaElizabeth Ashlan Dignan, MD 12/31/14 [phone]

## 2014-12-30 NOTE — ED Notes (Signed)
ptar called for patient 

## 2014-12-30 NOTE — Progress Notes (Signed)
Called pt's sister per his request.  Sister is willing to assist pt as she can, but is currently unable to offer much support to pt, due to her health/life circumstances.  Pt informed. CSW unable to place pt in a facility as he is both Medicaid and disability pending.  List of homeless shelters given.  Pt given phone to make calls because his cell was dead.

## 2014-12-30 NOTE — ED Notes (Addendum)
After being told previously that we would try to find pt a ride home, Pt now stating that he does not have anywhere to go, states that his family left today to go to [place] and that he was staying with them and now has nowhere to live. Case management and social worker contacted about situation.

## 2014-12-30 NOTE — ED Notes (Addendum)
Pt arrives via [facility], states pt had stroke in December with some left sided deficit residual, today pt was trying to get out of his wheelchair, states upon standing he got dizzy and fell, hitting the back of his head on the bath tub. Pt was complaining of some left post head pain as well as left shoulder and upper back, placed in c collar by ems, denies pain and dizziness at this time, alert, oriented, nad.

## 2014-12-30 NOTE — ED Notes (Signed)
Case Management and social worker advise no placement can be established for patient at this time due to his social history. Charge nurse made aware, she also spoke with case management, social worker and physician, advised pt can go home via ambulance transport at this time.

## 2014-12-30 NOTE — Discharge Instructions (Signed)
Contusion °A contusion is a deep bruise. Contusions are the result of an injury that caused bleeding under the skin. The contusion may turn blue, purple, or yellow. Minor injuries will give you a painless contusion, but more severe contusions may stay painful and swollen for a few weeks.  °CAUSES  °A contusion is usually caused by a blow, trauma, or direct force to an area of the body. °SYMPTOMS  °· Swelling and redness of the injured area. °· Bruising of the injured area. °· Tenderness and soreness of the injured area. °· Pain. °DIAGNOSIS  °The diagnosis can be made by taking a history and physical exam. An X-ray, CT scan, or MRI may be needed to determine if there were any associated injuries, such as fractures. °TREATMENT  °Specific treatment will depend on what area of the body was injured. In general, the best treatment for a contusion is resting, icing, elevating, and applying cold compresses to the injured area. Over-the-counter medicines may also be recommended for pain control. Ask your caregiver what the best treatment is for your contusion. °HOME CARE INSTRUCTIONS  °· Put ice on the injured area. °¨ Put ice in a plastic bag. °¨ Place a towel between your skin and the bag. °¨ Leave the ice on for 15-20 minutes, 3-4 times a day, or as directed by your health care provider. °· Only take over-the-counter or prescription medicines for pain, discomfort, or fever as directed by your caregiver. Your caregiver may recommend avoiding anti-inflammatory medicines (aspirin, ibuprofen, and naproxen) for 48 hours because these medicines may increase bruising. °· Rest the injured area. °· If possible, elevate the injured area to reduce swelling. °SEEK IMMEDIATE MEDICAL CARE IF:  °· You have increased bruising or swelling. °· You have pain that is getting worse. °· Your swelling or pain is not relieved with medicines. °MAKE SURE YOU:  °· Understand these instructions. °· Will watch your condition. °· Will get help right  away if you are not doing well or get worse. °Document Released: 08/18/2005 Document Revised: 11/13/2013 Document Reviewed: 09/13/2011 °[employer]® Patient Information ©2015 [employer]. This information is not intended to replace advice given to you by your health care provider. Make sure you discuss any questions you have with your health care provider. ° °

## 2014-12-31 NOTE — ED Notes (Signed)
Ptar here to take patient, spoke with this nurse and charge nurse Elliot GurneyWoody about patient and situation. ptar to take patient back to his home at this time.

## 2015-01-01 ENCOUNTER — Ambulatory Visit: Payer: Self-pay

## 2015-01-07 ENCOUNTER — Telehealth: Payer: Self-pay | Admitting: Cardiology

## 2015-01-07 NOTE — Telephone Encounter (Signed)
Pt will send manual transmission on Wednesday.

## 2015-01-16 ENCOUNTER — Ambulatory Visit (INDEPENDENT_AMBULATORY_CARE_PROVIDER_SITE_OTHER): Payer: Medicaid Other | Admitting: *Deleted

## 2015-01-16 DIAGNOSIS — I639 Cerebral infarction, unspecified: Secondary | ICD-10-CM

## 2015-01-16 LAB — MDC_IDC_ENUM_SESS_TYPE_REMOTE
Date Time Interrogation Session: 20151127163554
MDC IDC SET ZONE DETECTION INTERVAL: 3000 ms
Zone Setting Detection Interval: 2000 ms
Zone Setting Detection Interval: 340 ms

## 2015-01-17 ENCOUNTER — Emergency Department (HOSPITAL_COMMUNITY)
Admission: EM | Admit: 2015-01-17 | Discharge: 2015-01-17 | Disposition: A | Discharge status: Home / Self Care | Payer: Medicaid Other | Attending: Emergency Medicine | Admitting: Emergency Medicine

## 2015-01-17 ENCOUNTER — Encounter (HOSPITAL_COMMUNITY): Payer: Self-pay

## 2015-01-17 ENCOUNTER — Encounter: Payer: Self-pay | Admitting: Cardiology

## 2015-01-17 DIAGNOSIS — Z79899 Other long term (current) drug therapy: Secondary | ICD-10-CM | POA: Diagnosis not present

## 2015-01-17 DIAGNOSIS — Z7982 Long term (current) use of aspirin: Secondary | ICD-10-CM | POA: Diagnosis not present

## 2015-01-17 DIAGNOSIS — Z8673 Personal history of transient ischemic attack (TIA), and cerebral infarction without residual deficits: Secondary | ICD-10-CM | POA: Diagnosis not present

## 2015-01-17 DIAGNOSIS — M7989 Other specified soft tissue disorders: Secondary | ICD-10-CM | POA: Diagnosis not present

## 2015-01-17 DIAGNOSIS — Z87891 Personal history of nicotine dependence: Secondary | ICD-10-CM | POA: Diagnosis not present

## 2015-01-17 DIAGNOSIS — M79609 Pain in unspecified limb: Secondary | ICD-10-CM

## 2015-01-17 NOTE — ED Provider Notes (Signed)
The patient is a 54 year old male, he has a history of a stroke in November, a left ankle fracture in October. He presents to the hospital after being transported from the [facility] where he was trying to gain residence. They were concerned because of this chronic swelling and wanted a medical evaluation prior to allowing him to stay there. The patient has no complaints away from his baseline, he states that the swelling has been present for 2-3 weeks in his leg, he has had chronic swelling of his arm since the stroke. On exam the patient has pitting edema at the ankle, swelling of the left upper extremity at the wrist and the hand, soft abdomen, clear heart and lung sounds, follows commands at baseline with weakness of the left upper and left lower extremity. The patient will need evaluation for a DVT in the left lower extremity as this was the leg that was in a cast and now is the leg. He does not use to ambulate (uses wheelchair).  Otherwise the patient's swelling is of no consequence other than the stroke and he can be safely discharged back to the [facility].  Duplex ultrasound negative, discharge to outpatient follow-up  Medical screening examination/treatment/procedure(s) were conducted as a shared visit with non-physician practitioner(s) and myself.  I personally evaluated the patient during the encounter.  Clinical Impression:   Final diagnoses:  Foot swelling         Vida RollerBrian D Maraya Gwilliam, MD 01/18/15 [phone]

## 2015-01-17 NOTE — Progress Notes (Signed)
Loop recorder 

## 2015-01-17 NOTE — Discharge Instructions (Signed)
Edema °Edema is an abnormal buildup of fluids in your body tissues. Edema is somewhat dependent on gravity to pull the fluid to the lowest place in your body. That makes the condition more common in the legs and thighs (lower extremities). Painless swelling of the feet and ankles is common and becomes more likely as you get older. It is also common in looser tissues, like around your eyes.  °When the affected area is squeezed, the fluid may move out of that spot and leave a dent for a few moments. This dent is called pitting.  °CAUSES  °There are many possible causes of edema. Eating too much salt and being on your feet or sitting for a long time can cause edema in your legs and ankles. Hot weather may make edema worse. Common medical causes of edema include: °· Heart failure. °· Liver disease. °· Kidney disease. °· Weak blood vessels in your legs. °· Cancer. °· An injury. °· Pregnancy. °· Some medications. °· Obesity.  °SYMPTOMS  °Edema is usually painless. Your skin may look swollen or shiny.  °DIAGNOSIS  °Your health care provider may be able to diagnose edema by asking about your medical history and doing a physical exam. You may need to have tests such as X-rays, an electrocardiogram, or blood tests to check for medical conditions that may cause edema.  °TREATMENT  °Edema treatment depends on the cause. If you have heart, liver, or kidney disease, you need the treatment appropriate for these conditions. General treatment may include: °· Elevation of the affected body part above the level of your heart. °· Compression of the affected body part. Pressure from elastic bandages or support stockings squeezes the tissues and forces fluid back into the blood vessels. This keeps fluid from entering the tissues. °· Restriction of fluid and salt intake. °· Use of a water pill (diuretic). These medications are appropriate only for some types of edema. They pull fluid out of your body and make you urinate more often. This  gets rid of fluid and reduces swelling, but diuretics can have side effects. Only use diuretics as directed by your health care provider. °HOME CARE INSTRUCTIONS  °· Keep the affected body part above the level of your heart when you are lying down.   °· Do not sit still or stand for prolonged periods.   °· Do not put anything directly under your knees when lying down. °· Do not wear constricting clothing or garters on your upper legs.   °· Exercise your legs to work the fluid back into your blood vessels. This may help the swelling go down.   °· Wear elastic bandages or support stockings to reduce ankle swelling as directed by your health care provider.   °· Eat a low-salt diet to reduce fluid if your health care provider recommends it.   °· Only take medicines as directed by your health care provider.  °SEEK MEDICAL CARE IF:  °· Your edema is not responding to treatment. °· You have heart, liver, or kidney disease and notice symptoms of edema. °· You have edema in your legs that does not improve after elevating them.   °· You have sudden and unexplained weight gain. °SEEK IMMEDIATE MEDICAL CARE IF:  °· You develop shortness of breath or chest pain.   °· You cannot breathe when you lie down. °· You develop pain, redness, or warmth in the swollen areas.   °· You have heart, liver, or kidney disease and suddenly get edema. °· You have a fever and your symptoms suddenly get worse. °MAKE SURE YOU:  °·   Understand these instructions. °· Will watch your condition. °· Will get help right away if you are not doing well or get worse. °Document Released: 11/08/2005 Document Revised: 03/25/2014 Document Reviewed: 08/31/2013 °ExitCare® Patient Information ©2015 [employer]. This information is not intended to replace advice given to you by your health care provider. Make sure you discuss any questions you have with your health care provider. ° ° °Emergency Department Resource Guide °1) Find a Doctor and Pay Out of  Pocket °Although you won't have to find out who is covered by your insurance plan, it is a good idea to ask around and get recommendations. You will then need to call the office and see if the doctor you have chosen will accept you as a new patient and what types of options they offer for patients who are self-pay. Some doctors offer discounts or will set up payment plans for their patients who do not have insurance, but you will need to ask so you aren't surprised when you get to your appointment. ° °2) Contact Your Local Health Department °Not all health departments have doctors that can see patients for sick visits, but many do, so it is worth a call to see if yours does. If you don't know where your local health department is, you can check in your phone book. The CDC also has a tool to help you locate your state's health department, and many state websites also have listings of all of their local health departments. ° °3) Find a Walk-in Clinic °If your illness is not likely to be very severe or complicated, you may want to try a walk in clinic. These are popping up all over the country in pharmacies, drugstores, and shopping centers. They're usually staffed by nurse practitioners or physician assistants that have been trained to treat common illnesses and complaints. They're usually fairly quick and inexpensive. However, if you have serious medical issues or chronic medical problems, these are probably not your best option. ° °No Primary Care Doctor: °- Call [facility] at  832-8000 - they can help you locate a primary care doctor that  accepts your insurance, provides certain services, etc. °- Physician Referral Service- 1-800-533-3463 ° °Chronic Pain Problems: °Organization         Address  Phone   Notes  °[facility]  (336) 297-2271 Patients need to be referred by their primary care doctor.  ° °Medication Assistance: °Organization         Address  Phone   Notes  °[place]  [facility] [address] °[place], [place] [address] (336) 641-8030 --Must be a resident of [place] °-- Must have NO insurance coverage whatsoever (no Medicaid/ Medicare, etc.) °-- The pt. MUST have a primary care doctor that directs their care regularly and follows them in the community °  °[facility]  (866) 331-1348   °[employer]  (888) 892-1162   ° °Agencies that provide inexpensive medical care: °Organization         Address  Phone   Notes  °[facility]  (336) 832-8035   °[facility]    (336) 832-7272   °[facility] [address] °[place], [place] [address] (336) 832-4777   °[facility] [address], ° (336) 271-4999   °[facility]    (336) 373-0678   °[facility]    (336) 272-1050   °[facility] °   [address], [place] Phone:  (336) 832-4444, Fax:  (336) 832-4440 Hours of Operation:  9 am - 6 pm, M-F.  Also accepts Medicaid/Medicare and self-pay.  °[facility] ° [address], [place] Phone: (336) 832-3150, Fax: (336) [phone]. Hours of Operation:  8:30 am - 5:30 pm, M-F.  Also accepts Medicaid and self-pay.  °[facility] [place] [address], [place] Phone: (336) 878-6027   °[facility] [address], [place], [place] (336)723-1848, Ext. 123 Mondays & Thursdays: 7-9 AM.  First 15 patients are seen on a first come, first serve basis. °  ° °Medicaid-accepting [place] Providers: ° °Organization         Address  Phone   Notes  °[facility] [address], [place] (336) 641-2100 Also accepts self-pay patients.  °[facility] [address], [place] ° (336) 856-9996   °[facility] [address], [place] (336) 288-8857   °[facility] [address] [place]  [address], [place] (336) 299-7000   °Veita Bland [address], [place]  ° (336) 373-1557 Only accepts [place] Access Medicaid patients after they have their name applied to their card.  ° °Self-Pay (no insurance) in [place]: ° °Organization         Address  Phone   Notes  °Sickle Cell Patients, [facility] [address], [place] (336) 832-1970   °[facility] [address], [place] (336) 832-4400   °[facility] [place] ° [address] [place] [address], [place] (336) 992-4800   °[facility]/Dr. Osei-Bonsu ° [address] [place] [address], [place] or [address], [place] (336) 841-8500 Phone number for both [place] and [place] locations is the same.  °[facility] [address], [place] (336) 299-0000   °[facility] [address] [place] [address], [place] or [address] (336) 852-7530 °(336) 878-2260   °[facility] [address], [place] (336) 350-1642, phone; (336) 294-5005, fax Sees patients 1st and 3rd Saturday of every month.  Must not qualify for public or private insurance (i.e. Medicaid, Medicare, [place] Health Choice, Veterans' Benefits) • Household income should be no more than 200% of the poverty level •The clinic cannot treat you if you are pregnant or think you are pregnant • Sexually transmitted diseases are not treated at the clinic.  ° ° °Dental Care: °Organization         Address  Phone  Notes  °[place] [facility] [address],  (336) 641-6152 Accepts children up to age 21 who are enrolled in Medicaid or [place] Health Choice; pregnant women with a Medicaid card; and children who have applied for Medicaid or [place] Health Choice, but were declined, whose parents can pay a reduced fee at time of service.  °[facility] [place]  [address], [place]  (336) 641-7733 Accepts children up to age 21 who are enrolled in Medicaid or [place] Health Choice; pregnant women with a Medicaid card; and children who have applied for Medicaid or [place] Health Choice, but were declined, whose parents can pay a reduced fee at time of service.  °[facility] ° [address],  (336) 641-4533   Patients are seen by appointment only. Walk-ins are not accepted. [facility] will see patients 18 years of age and older. °Monday - Tuesday (8am-5pm) °Most Wednesdays (8:30-5pm) °$30 per visit, cash only  °[facility] ° [address], [place] (336) 641-4533 Patients are seen by appointment only. Walk-ins are not accepted. [facility] will see patients 18 years of age and older. °One Wednesday Evening (Monthly: Volunteer Based).  $30 per visit, cash only  °[facility]  (919) 537-3737 for adults; Children under age 4, call Graduate Pediatric Dentistry at (919) [phone]. Children aged 4-14, please call (919) [phone] to request a pediatric application. ° Dental services are provided in all areas of dental care including fillings, crowns and bridges, complete and partial dentures, implants, gum treatment, root canals, and extractions. Preventive care is also provided. Treatment is provided to both adults and children. °Patients are selected via a lottery and there is often a waiting list. °  °[facility] [address], °[place] ° (336) 763-8833 [email or web address] °  °[facility] [address], [place], [place] (336)723-1848, Ext. 123 Second and Fourth Thursday of each month, opens at 6:30 AM; Clinic ends at 9 AM.  Patients are seen on a first-come first-served basis, and a limited number are seen during each clinic.  ° °[facility] ° [address], [place], [place] (336) 723-7904   Eligibility Requirements °You must have lived in [place], [place], or [place]  counties for at least the last three months. °  You cannot be eligible for state or federal sponsored healthcare insurance, including Veterans Administration, Medicaid, or Medicare. °  You generally cannot be eligible for healthcare insurance through your employer.  °  How to apply: °Eligibility screenings are held every Tuesday and Wednesday afternoon from 1:00 pm until 4:00 pm. You do not need an appointment for the interview!  °[facility] [address], [place], [place] 336-631-2330   °[facility]  336-342-8273   °[place]  336-703-3100   °[facility]  336-570-6415   ° °Behavioral Health Resources in the Community: °Intensive Outpatient Programs °Organization         Address  Phone  Notes  °[facility] [address], [place], [place] 336-878-6098   °[facility] [address], [place], [place] 336-832-9800   °[facility] [address], [place], [place] ° 336-882-2125   °[facility] [address],  °[place], [place] 1-800-853-5163 or 336-641-4981   °Substance Abuse Resources °Organization         Address  Phone  Notes  °[facility]  336-882-2125   °[facility]  336-784-9470   °[facility]  336-285-9073   °[facility]  336-845-3988   °Residential & Outpatient Substance Abuse Program  1-800-659-3381   °Psychological Services °Organization         Address  Phone  Notes  °[facility]  336- 832-9600   °[facility]  336- 378-7881   °[facility] [address], [place] 1-800-853-5163 or 336-641-4981   ° °Mobile Crisis Teams °Organization         Address  Phone  Notes  °[facility], Mobile Crisis Care Unit  1-877-626-1772   °Assertive °[facility] ° [address] [place], [place] 336-834-9664   °Sharon DeEsch [address] °  [place]  [place] 336-554-5454   ° °Self-Help/Support Groups °Organization         Address  Phone             Notes  °[facility] - variety of support groups  336- 373-1402 Call for more information  °Narcotics Anonymous (NA), [facility] [address], °[place]  2 meetings at this location  ° °Residential Treatment Programs °Organization         Address  Phone  Notes  °[facility] [address],    °[place]  1-866-801-8205   °[facility] ° [address], [place], [place] 704-293-8524   °[facility] [address], [place] 336-845-3988 Admissions: 8am-3pm M-F  °[facility] [address]    °[place], [place] 336-841-1104   °[facility] [address], [place], [place] 336-379-7146   °[facility] [address]  °[place], [place] 336-285-9073   °[facility] - Intensive Outpatient [address], [place], [place] 336-852-3033   °[facility] [address]  °[place], [place] 1-877-615-2722 or 336-784-9470   °[facility] ([facility]) [address] , [place] 336-227-7417 Accepts Medicaid  °[facility] [address]  °[place] 1-800-659-3381 Substance Abuse/Addiction Treatment  ° °[place] Behavioral Health Resources °Organization         Address  Phone  Notes  °[facility]  (888) 581-9988   °Julie Brannon, PhD [address] [place], [place]   (336) 349-5553 or (336) 951-0000   °[facility]   [address] °[place], [place] (336) 349-4454   °[facility] [address], [place], [place] (336) 342-8316 Insurance/Medicaid/sponsorship through [facility]  °[facility] [address]                                    [place], [place] (336) 342-8316 Therapy/tele-psych/case  °[facility] [address]  ° [place], [place] (336) 349-2233    °Dr. Arfeen  (336)  349-4544   °[facility] [place]  [employer] [place] [facility] 1) [address], [place] °2) [address], [place] °3)  [address], [place] (336) 349-3220 °(336) 342-7768 ° °(336) 342-8140   °[place] [facility] (336) 342-1394 or (336) 342-3537 (After Hours)    ° ° ° ° ° ° °

## 2015-01-17 NOTE — ED Provider Notes (Signed)
CSN: [id number]     Arrival date & time 01/17/15  1601 History   First MD Initiated Contact with Patient 01/17/15 1628     Chief Complaint  Patient presents with  . Leg Swelling     (Consider location/radiation/quality/duration/timing/severity/associated sxs/prior Treatment) The history is provided by the patient. No language interpreter was used.  Mr. Cory [name] presents with swelling of the left hand and foot that has been ongoing for the past 2 months. He says he needs to have this checked in order [facility].  He uses a wheelchair to get around.  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 Nothing makes this better or worse.  He is requesting physical therapy but no other complaints.   Past Medical History  Diagnosis Date  . Stroke    Past Surgical History  Procedure Laterality Date  . No past surgeries    . Tee without cardioversion N/A 10/16/2014    Procedure: TRANSESOPHAGEAL ECHOCARDIOGRAM (TEE);  Surgeon: Donato SchultzMark Skains, MD;  [facility];  Service: Cardiovascular;  Laterality: N/A;  . Loop recorder implant N/A 10/18/2014    Procedure: LOOP RECORDER IMPLANT;  Surgeon: Duke SalviaSteven C Klein, MD;  [facility];  Service: Cardiovascular;  Laterality: N/A;   Family History  Problem Relation Age of Onset  . Stroke Mother   . Stroke Sister    History  Substance Use Topics  . Smoking status: Former Games developermoker  . Smokeless tobacco: Not on file  . Alcohol Use: Yes    Review of Systems  Constitutional: Negative for fatigue.  Respiratory: Negative for shortness of breath.   Neurological: Negative for dizziness, weakness and headaches.     Allergies  Review of patient's allergies indicates no known allergies.  Home Medications   Prior to Admission medications   Medication Sig Start Date End Date Taking? Authorizing Provider  aspirin 325 MG tablet Take 1 tablet (325 mg total) by mouth once. 12/30/14   Tilden FossaElizabeth Rees, MD   atorvastatin (LIPITOR) 20 MG tablet Take 1 tablet (20 mg total) by mouth daily at 6 PM. 12/30/14   Tilden FossaElizabeth Rees, MD  pantoprazole (PROTONIX) 20 MG tablet Take 1 tablet (20 mg total) by mouth daily. 12/30/14   Tilden FossaElizabeth Rees, MD  senna-docusate (SENOKOT-S) 8.6-50 MG per tablet Take 2 tablets by mouth at bedtime. 11/27/14   Jacquelynn CreePamela S Love, PA-C  thiamine 100 MG tablet Take 1 tablet (100 mg total) by mouth daily. 10/18/14   Shanker Levora DredgeM Ghimire, MD  tiZANidine (ZANAFLEX) 2 MG tablet Take 1 tablet (2 mg total) by mouth 3 (three) times daily. 11/27/14   Evlyn KannerPamela S Love, PA-C   BP 130/96 mmHg  Pulse 92  Temp(Src) 98 F (36.7 C) (Oral)  Resp 15  SpO2 100% Physical Exam  Constitutional: He is oriented to person, place, and time.  Cardiovascular: Normal rate, regular rhythm and normal heart sounds.   Pulmonary/Chest: Effort normal and breath sounds normal.  Neurological: He is alert and oriented to person, place, and time.  Edema of left hand with full passive ROM.  Limited ROM of left arm and left leg secondary to CVA and left ankle surgery. Pt uses wheelchair at baseline. No lower left extremity calf tenderness but difficult to examine since pt does not have full sensation in left leg.  ED Course  Procedures (including critical care time) Labs Review Labs Reviewed - No data to display  Imaging Review No results  found.   EKG Interpretation None      MDM  Negative Doppler.  Patient at baseline. Final diagnoses:  Foot swelling        Catha Gosselin, PA-C 01/17/15 2054  Vida Roller, MD 01/18/15 [phone]

## 2015-01-17 NOTE — ED Notes (Signed)
Per EMS, [facility]. Patient is homeless and tried to get placement in the house. Patient had swelling of the left hand and left ankle that has been going on for about two weeks which [facility]. Patient denies any pain. Patient had history of broken left ankle last October and when removing the soft cast in November, patient had stroke. Left Sided Deficit at baseline. Patient is alert and oriented x4 at this time. Vitals 160/90, 88 HR, 97% on RA, and 18 RR.

## 2015-01-17 NOTE — Progress Notes (Signed)
VASCULAR LAB PRELIMINARY  PRELIMINARY  PRELIMINARY  PRELIMINARY  Left lower extremity venous duplex completed.    Preliminary report:  Left:  No evidence of DVT, superficial thrombosis, or Baker's cyst.  Mallori Araque, RVS 01/17/2015, 7:45 PM

## 2015-01-17 NOTE — ED Notes (Signed)
Pt made aware to return if symptoms worsen or if any life threatening symptoms occur.   

## 2015-01-18 ENCOUNTER — Observation Stay (HOSPITAL_COMMUNITY)
Admission: EM | Admit: 2015-01-18 | Discharge: 2015-01-20 | Disposition: A | Discharge status: Home / Self Care | Payer: Medicaid Other | Attending: Internal Medicine | Admitting: Internal Medicine

## 2015-01-18 ENCOUNTER — Encounter (HOSPITAL_COMMUNITY): Payer: Self-pay | Admitting: Emergency Medicine

## 2015-01-18 DIAGNOSIS — G8114 Spastic hemiplegia affecting left nondominant side: Principal | ICD-10-CM | POA: Insufficient documentation

## 2015-01-18 DIAGNOSIS — R209 Unspecified disturbances of skin sensation: Secondary | ICD-10-CM

## 2015-01-18 DIAGNOSIS — M7989 Other specified soft tissue disorders: Secondary | ICD-10-CM | POA: Diagnosis present

## 2015-01-18 DIAGNOSIS — Z79899 Other long term (current) drug therapy: Secondary | ICD-10-CM | POA: Insufficient documentation

## 2015-01-18 DIAGNOSIS — F1020 Alcohol dependence, uncomplicated: Secondary | ICD-10-CM | POA: Diagnosis present

## 2015-01-18 DIAGNOSIS — Z87891 Personal history of nicotine dependence: Secondary | ICD-10-CM | POA: Insufficient documentation

## 2015-01-18 DIAGNOSIS — G8110 Spastic hemiplegia affecting unspecified side: Secondary | ICD-10-CM

## 2015-01-18 DIAGNOSIS — Z7982 Long term (current) use of aspirin: Secondary | ICD-10-CM | POA: Diagnosis not present

## 2015-01-18 DIAGNOSIS — I69398 Other sequelae of cerebral infarction: Secondary | ICD-10-CM

## 2015-01-18 DIAGNOSIS — R5381 Other malaise: Secondary | ICD-10-CM | POA: Diagnosis present

## 2015-01-18 LAB — CBC
HCT: 42 % (ref 39.0–52.0)
HEMOGLOBIN: 13.3 g/dL (ref 13.0–17.0)
MCH: 27 pg (ref 26.0–34.0)
MCHC: 31.7 g/dL (ref 30.0–36.0)
MCV: 85.2 fL (ref 78.0–100.0)
PLATELETS: 220 10*3/uL (ref 150–400)
RBC: 4.93 MIL/uL (ref 4.22–5.81)
RDW: 13.8 % (ref 11.5–15.5)
WBC: 7.6 10*3/uL (ref 4.0–10.5)

## 2015-01-18 LAB — COMPREHENSIVE METABOLIC PANEL WITH GFR
ALT: 21 U/L (ref 0–53)
AST: 20 U/L (ref 0–37)
Albumin: 3.8 g/dL (ref 3.5–5.2)
Alkaline Phosphatase: 63 U/L (ref 39–117)
Anion gap: 4 — ABNORMAL LOW (ref 5–15)
BUN: 13 mg/dL (ref 6–23)
CO2: 28 mmol/L (ref 19–32)
Calcium: 8.8 mg/dL (ref 8.4–10.5)
Chloride: 104 mmol/L (ref 96–112)
Creatinine, Ser: 0.84 mg/dL (ref 0.50–1.35)
GFR calc Af Amer: >90 mL/min
GFR calc non Af Amer: >90 mL/min
Glucose, Bld: 93 mg/dL (ref 70–99)
Potassium: 3.8 mmol/L (ref 3.5–5.1)
Sodium: 136 mmol/L (ref 135–145)
Total Bilirubin: 0.5 mg/dL (ref 0.3–1.2)
Total Protein: 7.7 g/dL (ref 6.0–8.3)

## 2015-01-18 LAB — BRAIN NATRIURETIC PEPTIDE: B NATRIURETIC PEPTIDE 5: 88.6 pg/mL (ref 0.0–100.0)

## 2015-01-18 MED ORDER — SODIUM CHLORIDE 0.9 % IV SOLN: 250.0000 mL | INTRAVENOUS | Status: DC | PRN

## 2015-01-18 MED ORDER — VITAMIN B-1 100 MG PO TABS
100.0000 mg | ORAL_TABLET | Freq: Every day | ORAL | Status: DC
  Administered 2015-01-19 – 2015-01-20 (×2): 100 mg via ORAL
  Filled 2015-01-18 (×2): qty 1

## 2015-01-18 MED ORDER — TIZANIDINE HCL 2 MG PO TABS
2.0000 mg | ORAL_TABLET | Freq: Three times a day (TID) | ORAL | Status: DC
  Administered 2015-01-18 – 2015-01-20 (×6): 2 mg via ORAL
  Filled 2015-01-18 (×7): qty 1

## 2015-01-18 MED ORDER — ACETAMINOPHEN 325 MG PO TABS: 650.0000 mg | ORAL_TABLET | Freq: Four times a day (QID) | ORAL | Status: DC | PRN

## 2015-01-18 MED ORDER — FOLIC ACID 1 MG PO TABS
1.0000 mg | ORAL_TABLET | Freq: Every day | ORAL | Status: DC
  Administered 2015-01-19 – 2015-01-20 (×2): 1 mg via ORAL
  Filled 2015-01-18 (×2): qty 1

## 2015-01-18 MED ORDER — ACETAMINOPHEN 650 MG RE SUPP: 650.0000 mg | Freq: Four times a day (QID) | RECTAL | Status: DC | PRN

## 2015-01-18 MED ORDER — SODIUM CHLORIDE 0.9 % IJ SOLN: 3.0000 mL | INTRAMUSCULAR | Status: DC | PRN

## 2015-01-18 MED ORDER — ONDANSETRON HCL 4 MG/2ML IJ SOLN: 4.0000 mg | Freq: Four times a day (QID) | INTRAMUSCULAR | Status: DC | PRN

## 2015-01-18 MED ORDER — PANTOPRAZOLE SODIUM 20 MG PO TBEC
20.0000 mg | DELAYED_RELEASE_TABLET | Freq: Every day | ORAL | Status: DC
  Administered 2015-01-19 – 2015-01-20 (×2): 20 mg via ORAL
  Filled 2015-01-18 (×2): qty 1

## 2015-01-18 MED ORDER — ONDANSETRON HCL 4 MG PO TABS: 4.0000 mg | ORAL_TABLET | Freq: Four times a day (QID) | ORAL | Status: DC | PRN

## 2015-01-18 MED ORDER — ENOXAPARIN SODIUM 40 MG/0.4ML ~~LOC~~ SOLN
40.0000 mg | Freq: Every day | SUBCUTANEOUS | Status: DC
  Administered 2015-01-18 – 2015-01-19 (×2): 40 mg via SUBCUTANEOUS
  Filled 2015-01-18 (×3): qty 0.4

## 2015-01-18 MED ORDER — ADULT MULTIVITAMIN W/MINERALS CH
1.0000 | ORAL_TABLET | Freq: Every day | ORAL | Status: DC
Start: 2015-01-19 — End: 2015-01-20
  Administered 2015-01-19 – 2015-01-20 (×2): 1 via ORAL
  Filled 2015-01-18 (×2): qty 1

## 2015-01-18 MED ORDER — TRAZODONE HCL 50 MG PO TABS
50.0000 mg | ORAL_TABLET | Freq: Every evening | ORAL | Status: DC | PRN
  Administered 2015-01-19 (×2): 50 mg via ORAL
  Filled 2015-01-18 (×2): qty 1

## 2015-01-18 MED ORDER — SODIUM CHLORIDE 0.9 % IJ SOLN: 3.0000 mL | Freq: Two times a day (BID) | INTRAMUSCULAR | Status: DC

## 2015-01-18 MED ORDER — DOCUSATE SODIUM 100 MG PO CAPS
100.0000 mg | ORAL_CAPSULE | Freq: Two times a day (BID) | ORAL | Status: DC
  Administered 2015-01-19 – 2015-01-20 (×3): 100 mg via ORAL
  Filled 2015-01-18 (×4): qty 1

## 2015-01-18 MED ORDER — ASPIRIN 325 MG PO TABS
325.0000 mg | ORAL_TABLET | Freq: Every day | ORAL | Status: DC
  Administered 2015-01-19 – 2015-01-20 (×2): 325 mg via ORAL
  Filled 2015-01-18 (×2): qty 1

## 2015-01-18 MED ORDER — ATORVASTATIN CALCIUM 20 MG PO TABS
20.0000 mg | ORAL_TABLET | Freq: Every day | ORAL | Status: DC
  Administered 2015-01-19 – 2015-01-20 (×2): 20 mg via ORAL
  Filled 2015-01-18 (×2): qty 1

## 2015-01-18 NOTE — ED Provider Notes (Signed)
CSN: [id number]     Arrival date & time 01/18/15  1456 History   First MD Initiated Contact with Patient 01/18/15 1641     Chief Complaint  Patient presents with  . Arm Swelling  . Leg Swelling     (Consider location/radiation/quality/duration/timing/severity/associated sxs/prior Treatment) The history is provided by the patient and medical records. No language interpreter was used.     Cory [name] is a 54 y.o. male  with a hx of CVA 2015 presents to the Emergency Department complaining of gradual, persistent, progressively worsening left arm and leg swelling onset 5-7 days ago.  Pt reports his left arm has been swollen since his stroke, the left leg is new but both are worsening at this time.  Pt has left hemiplegia and decreased left sided sensation as a result of his CVA.  He [place] unable to ambulate at baseline and uses a chair.  Record review shows that pt was evaluated yesterday for the same, but pt reports that it is now worse and c/o left leg pain from muscle spasms.    When questioned about yesterday's visit he reports that he was unable to get into the [facility] because he is unable to care for himself.  He reports he was living in a house with his girlfriend, but she moved out and shut the power off.  He reports he has no where to go and cannot care for himself.     Past Medical History  Diagnosis Date  . Stroke    Past Surgical History  Procedure Laterality Date  . No past surgeries    . Tee without cardioversion N/A 10/16/2014    Procedure: TRANSESOPHAGEAL ECHOCARDIOGRAM (TEE);  Surgeon: Donato Schultz, MD;  Location: [facility] ENDOSCOPY;  Service: Cardiovascular;  Laterality: N/A;  . Loop recorder implant N/A 10/18/2014    Procedure: LOOP RECORDER IMPLANT;  Surgeon: Duke Salvia, MD;  Location: [facility] CATH LAB;  Service: Cardiovascular;  Laterality: N/A;   Family History  Problem Relation Age of Onset  . Stroke Mother   . Stroke Sister    History  Substance Use Topics  .  Smoking status: Former Games developer  . Smokeless tobacco: Not on file  . Alcohol Use: Yes     Comment: 1 can of Beer a day    Review of Systems  Constitutional: Negative for fever, diaphoresis, appetite change, fatigue and unexpected weight change.  HENT: Negative for mouth sores.   Eyes: Negative for visual disturbance.  Respiratory: Negative for cough, chest tightness, shortness of breath and wheezing.   Cardiovascular: Positive for leg swelling. Negative for chest pain.  Gastrointestinal: Negative for nausea, vomiting, abdominal pain, diarrhea and constipation.  Endocrine: Negative for polydipsia, polyphagia and polyuria.  Genitourinary: Negative for dysuria, urgency, frequency and hematuria.  Musculoskeletal: Positive for arthralgias. Negative for back pain and neck stiffness.  Skin: Negative for rash.  Allergic/Immunologic: Negative for immunocompromised state.  Neurological: Negative for syncope, light-headedness and headaches.  Hematological: Does not bruise/bleed easily.  Psychiatric/Behavioral: Negative for sleep disturbance. The patient is not nervous/anxious.       Allergies  Review of patient's allergies indicates no known allergies.  Home Medications   Prior to Admission medications   Medication Sig Start Date End Date Taking? Authorizing Provider  aspirin 325 MG tablet Take 1 tablet (325 mg total) by mouth once. Patient taking differently: Take 325 mg by mouth daily.  12/30/14  Yes Tilden Fossa, MD  atorvastatin (LIPITOR) 20 MG tablet Take 1 tablet (20  mg total) by mouth daily at 6 PM. 12/30/14  Yes Tilden FossaElizabeth Rees, MD  pantoprazole (PROTONIX) 20 MG tablet Take 1 tablet (20 mg total) by mouth daily. 12/30/14  Yes Tilden FossaElizabeth Rees, MD  tiZANidine (ZANAFLEX) 2 MG tablet Take 1 tablet (2 mg total) by mouth 3 (three) times daily. 11/27/14  Yes Evlyn KannerPamela S Love, PA-C  senna-docusate (SENOKOT-S) 8.6-50 MG per tablet Take 2 tablets by mouth at bedtime. Patient not taking: Reported on  01/18/2015 11/27/14   Evlyn KannerPamela S Love, PA-C  thiamine 100 MG tablet Take 1 tablet (100 mg total) by mouth daily. Patient not taking: Reported on 01/18/2015 10/18/14   Maretta BeesShanker M Ghimire, MD   BP 135/79 mmHg  Pulse 92  Temp(Src) 98.8 F (37.1 C) (Oral)  Resp 15  SpO2 98% Physical Exam  Constitutional: He appears well-developed and well-nourished. No distress.  Awake, alert, nontoxic appearance  HENT:  Head: Normocephalic and atraumatic.  Mouth/Throat: Oropharynx is clear and moist. No oropharyngeal exudate.  Eyes: Conjunctivae are normal. No scleral icterus.  Neck: Normal range of motion. Neck supple.  Cardiovascular: Normal rate, regular rhythm, normal heart sounds and intact distal pulses.   No tachycardia  Pulmonary/Chest: Effort normal and breath sounds normal. No respiratory distress. He has no wheezes.  Equal chest expansion Clear and equal breath sounds  Abdominal: Soft. Bowel sounds are normal. He exhibits no mass. There is no tenderness. There is no rebound and no guarding.  abd soft and nontender  Musculoskeletal: Normal range of motion. He exhibits edema.  Patient is unable to range the joint due to pre-existing weakness. 2+ radial pulses bilaterally  1+ nonpitting edema of the left hand and wrist 2+ pitting edema of the left proximal forefoot and ankle  Neurological: He is alert.  Speech is clear and goal oriented Moves extremities without ataxia There is left-sided facial droop, 3/5 strength in the left upper extremity, 4/5 strength in the left lower extremity - all residual .   Skin: Skin is warm and dry. He is not diaphoretic.  Psychiatric: He has a normal mood and affect.  Nursing note and vitals reviewed.   ED Course  Procedures (including critical care time) Labs Review Labs Reviewed  COMPREHENSIVE METABOLIC PANEL - Abnormal; Notable for the following:    Anion gap 4 (*)    All other components within normal limits  CBC  BRAIN NATRIURETIC PEPTIDE    Imaging  Review No results found.   EKG Interpretation None       01/17/2015, 7:45 PM VASCULAR LAB:  Preliminary report: Left: No evidence of DVT, superficial thrombosis, or Baker's cyst.   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  His story has changed since yesterday about the length of time symptoms have been present, but I suspect he is hoping for ana admission as further probing about the reason for his visit reveals that he is unable to care for himself at home.  Venous duplex negative yesterday.  Will check labs today.  He is without CP or SOB.    7PM Labs reassuring. Patient without leukocytosis, elevation in BNP, CMP without electrolyte disturbance. Patient is unable to dress himself or undress himself here in the emergency department without assistance. He reports that he is unable to care for himself at home. There is currently no social work or case  management here in the emergency department. Patient will need admission for potential placement.  9:38 PM Discussed with Dr. Felipa Furnace who will admit in an attempt to find placement for patient.    Dahlia Client Cory Turvey, PA-C 01/18/15 2143  Candyce Churn III, MD 01/20/15 [phone]

## 2015-01-18 NOTE — ED Notes (Signed)
Pt states that he has been having swelling to rt hand and bilateral feet x 2 days.

## 2015-01-18 NOTE — ED Notes (Signed)
He reports left hand and bilat. Feet swelling "ever since I had a stroke in November" (Nov. 2015)

## 2015-01-18 NOTE — H&P (Signed)
PCP: none   Chief Complaint:  Have nowhere to live  HPI: Cory [name] is a 54 y.o. male   has a past medical history of Stroke.   Presented with  Patient was living with a friend who has moved out. 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 Patient was seen in ER [facility] was sent back due to his inability for care for self. Family [place]. He reports some swelling in his left arm and leg that has been going on for some weeks. Dopplers were negative for DVT. ER attempted to contact social work but not available at this time. [facility] but had to go back to [place] due to probation.   Hospitalist was called for admission for  Social situation  Review of Systems:    Pertinent positives include:  Left side weakness  Constitutional:  No weight loss, night sweats, Fevers, chills, fatigue, weight loss  HEENT:  No headaches, Difficulty swallowing,Tooth/dental problems,Sore throat,  No sneezing, itching, ear ache, nasal congestion, post nasal drip,  Cardio-vascular:  No chest pain, Orthopnea, PND, anasarca, dizziness, palpitations.no Bilateral lower extremity swelling  GI:  No heartburn, indigestion, abdominal pain, nausea, vomiting, diarrhea, change in bowel habits, loss of appetite, melena, blood in stool, hematemesis Resp:  no shortness of breath at rest. No dyspnea on exertion, No excess mucus, no productive cough, No non-productive cough, No coughing up of blood.No change in color of mucus.No wheezing. Skin:  no rash or lesions. No jaundice GU:  no dysuria, change in color of urine, no urgency or frequency. No straining to urinate.  No flank pain.  Musculoskeletal:  No joint pain or no joint swelling. No decreased range of motion. No back pain.  Psych:  No change in mood or affect. No depression or anxiety. No memory loss.  Neuro: no localizing neurological  complaints, no tingling, no weakness, no double vision, no gait abnormality, no slurred speech, no confusion  Otherwise ROS are negative except for above, 10 systems were reviewed  Past Medical History: Past Medical History  Diagnosis Date  . Stroke    Past Surgical History  Procedure Laterality Date  . No past surgeries    . Tee without cardioversion N/A 10/16/2014    Procedure: TRANSESOPHAGEAL ECHOCARDIOGRAM (TEE);  Surgeon: Donato SchultzMark Skains, MD;  [facility];  Service: Cardiovascular;  Laterality: N/A;  . Loop recorder implant N/A 10/18/2014    Procedure: LOOP RECORDER IMPLANT;  Surgeon: Duke SalviaSteven C Klein, MD;  [facility];  Service: Cardiovascular;  Laterality: N/A;     Medications: Prior to Admission medications   Medication Sig Start Date End Date Taking? Authorizing Provider  aspirin 325 MG tablet Take 1 tablet (325 mg total) by mouth once. Patient taking differently: Take 325 mg by mouth daily.  12/30/14  Yes Tilden FossaElizabeth Rees, MD  atorvastatin (LIPITOR) 20 MG tablet Take 1 tablet (20 mg total) by mouth daily at 6 PM. 12/30/14  Yes Tilden FossaElizabeth Rees, MD  pantoprazole (PROTONIX) 20 MG tablet Take 1 tablet (20 mg total) by mouth daily. 12/30/14  Yes Tilden FossaElizabeth Rees, MD  tiZANidine (ZANAFLEX) 2 MG tablet Take 1 tablet (2 mg total) by mouth 3 (three) times daily. 11/27/14  Yes Evlyn KannerPamela S Love, PA-C  senna-docusate (SENOKOT-S) 8.6-50 MG per tablet Take 2 tablets by mouth at bedtime. Patient not taking: Reported on 01/18/2015 11/27/14  Evlyn Kanner Love, PA-C  thiamine 100 MG tablet Take 1 tablet (100 mg total) by mouth daily. Patient not taking: Reported on 01/18/2015 10/18/14   Maretta Bees, MD    Allergies:  No Known Allergies  Social History:  Ambulatory wheelchair bound  homeless   reports that he has quit smoking. He does not have any smokeless tobacco history on file. He reports that he drinks alcohol. He reports that he does not use illicit drugs.    Family  History: family history includes Stroke in his mother and sister.    Physical Exam: Patient Vitals for the past 24 hrs:  BP Temp Temp src Pulse Resp SpO2  01/18/15 2100 135/79 mmHg - - 92 15 98 %  01/18/15 2000 136/83 mmHg - - 88 16 99 %  01/18/15 1933 - - - 89 12 100 %  01/18/15 1930 135/76 mmHg - - - 13 -  01/18/15 1900 117/88 mmHg - - - 14 -  01/18/15 1830 150/88 mmHg - - - 18 -  01/18/15 1800 133/83 mmHg - - - 13 -  01/18/15 1730 114/70 mmHg - - (!) 55 16 100 %  01/18/15 1726 139/83 mmHg - - 81 15 100 %  01/18/15 1514 145/92 mmHg 98.8 F (37.1 C) Oral 93 20 99 %    1. General:  in No Acute distress 2. Psychological: Alert and   Oriented 3. Head/ENT:   Moist  Mucous Membranes                          Head Non traumatic, neck supple                          Normal  Dentition 4. SKIN:  decreased Skin turgor,  Skin clean Dry and intact no rash 5. Heart: Regular rate and rhythm no Murmur, Rub or gallop 6. Lungs: Clear to auscultation bilaterally, no wheezes or crackles   7. Abdomen: Soft, non-tender, Non distended 8. Lower extremities: no clubbing, cyanosis, or edema 9. Neurologically Grossly intact, moving all 4 extremities equally 10. MSK: Normal range of motion  body mass index is unknown because there is no weight on file.   Labs on Admission:   Results for orders placed or performed during the hospital encounter of 01/18/15 (from the past 24 hour(s))  CBC     Status: None   Collection Time: 01/18/15  5:19 PM  Result Value Ref Range   WBC 7.6 4.0 - 10.5 K/uL   RBC 4.93 4.22 - 5.81 MIL/uL   Hemoglobin 13.3 13.0 - 17.0 g/dL   HCT 16.1 09.6 - 04.5 %   MCV 85.2 78.0 - 100.0 fL   MCH 27.0 26.0 - 34.0 pg   MCHC 31.7 30.0 - 36.0 g/dL   RDW 40.9 81.1 - 91.4 %   Platelets 220 150 - 400 K/uL  Comprehensive metabolic panel     Status: Abnormal   Collection Time: 01/18/15  5:19 PM  Result Value Ref Range   Sodium 136 135 - 145 mmol/L   Potassium 3.8 3.5 - 5.1 mmol/L    Chloride 104 96 - 112 mmol/L   CO2 28 19 - 32 mmol/L   Glucose, Bld 93 70 - 99 mg/dL   BUN 13 6 - 23 mg/dL   Creatinine, Ser 7.82 0.50 - 1.35 mg/dL   Calcium 8.8 8.4 - 95.6 mg/dL   Total Protein 7.7 6.0 -  8.3 g/dL   Albumin 3.8 3.5 - 5.2 g/dL   AST 20 0 - 37 U/L   ALT 21 0 - 53 U/L   Alkaline Phosphatase 63 39 - 117 U/L   Total Bilirubin 0.5 0.3 - 1.2 mg/dL   GFR calc non Af Amer >90 >90 mL/min   GFR calc Af Amer >90 >90 mL/min   Anion gap 4 (L) 5 - 15  Brain natriuretic peptide     Status: None   Collection Time: 01/18/15  5:19 PM  Result Value Ref Range   B Natriuretic Peptide 88.6 0.0 - 100.0 pg/mL    UA not obtained  Lab Results  Component Value Date   HGBA1C 6.1* 10/15/2014    CrCl cannot be calculated (Unknown ideal weight.).  BNP (last 3 results) No results for input(s): PROBNP in the last 8760 hours.     There were no vitals filed for this visit.   Cultures: No results found for: SDES, SPECREQUEST, CULT, REPTSTATUS   Radiological Exams on Admission: No results found.  Chart has been reviewed  Assessment/Plan  54 yo M w hx of CVA in November 2015 resulting in left side paralisis currently homeless   Present on Admission:  . 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 social work and care management consult . Alcoholism -reports drinking only 1 beer a day, denies any withdrawal, will monitor . Debility - PT OT eval Hyperlipidemia continue - lipitor  Prophylaxis:  Lovenox, Protonix  CODE STATUS:  FULL CODE  Other plan as per orders.  I have spent a total of 55 min on this admission  Debbora Ang 01/18/2015, 9:41 PM  [facility]  Pager [phone]   after 2 AM please page floor coverage PA If 7AM-7PM, please contact the day team taking care of the patient  [email or web address]  Password [email or web address]

## 2015-01-19 DIAGNOSIS — F1020 Alcohol dependence, uncomplicated: Secondary | ICD-10-CM

## 2015-01-19 LAB — PHOSPHORUS: PHOSPHORUS: 4.5 mg/dL (ref 2.3–4.6)

## 2015-01-19 LAB — TSH: TSH: 1.338 u[IU]/mL (ref 0.350–4.500)

## 2015-01-19 LAB — MAGNESIUM: Magnesium: 1.7 mg/dL (ref 1.5–2.5)

## 2015-01-19 NOTE — Clinical Social Work Note (Signed)
CSW received a consult for pt stating social issues  CSW reviewed chart which reflected that pt was on observation status  CSW spoke with Md who stated that Pt does not have any acute needs and is ready for discharge  CSW met with pt who wants long term SNF  CSW called numerous facilities for placement options but with weekend and pt looking for long term care the options are limited  CSW called and left message for [facility], [facility] but received no calls back  CSW called and spoke with [facility] and they do not have any Medicaid beds at this time  CSW called and spoke with [facility] but they have no way of looking at pt's Medicaid over the weekend and long term requires permission from boards that do not work weekends, "it is a longer process"  CSW called and spoke with [facility] who stated that they would likely have a bed for pt on Monday but needed their RN to [facility] over asking CSW to please follow up with admissions/Muriel [phone] on Monday  CSW faxed pt information out to numerous regions for SNF placement and weekday CSW should follow up with [facility] first thing Monday   .Dede Query, LCSW [facility] Clinical Social Worker - Weekend Coverage cell #: [phone]

## 2015-01-19 NOTE — Clinical Social Work Psychosocial (Signed)
Clinical Social Work Department BRIEF PSYCHOSOCIAL ASSESSMENT 01/19/2015  Patient:  Cory [name], Cory [name]     Account Number:  [id number]     [place] date:  01/18/2015  Clinical Social Worker:  Dede Query, CLINICAL SOCIAL WORKER  Date/Time:  01/19/2015 02:34 PM  Referred by:  Physician  Date Referred:  01/19/2015 Referred for  SNF Placement   Other Referral:   Interview type:  Patient Other interview type:    PSYCHOSOCIAL DATA Living Status:  OTHER Admitted from facility:  Homeless shelter Level of care:  shelter Primary support name:  genetha Primary support relationship to patient:  SIBLING Degree of support available:   low. she lives in [place] Current Concerns  Adjustment to Illness/paralysis   Other Concerns:   Homeless    SOCIAL WORK ASSESSMENT / PLAN CSW met with pt at bedside.  CSW introduced herself and explained the role of CSW.  CSW prompted pt to discuss history and need for services.  CSW provided active listening.  CSW explored placement options with pt and agreed to send his information to facilities to help find him a long term placement   Assessment/plan status:  Psychosocial Support/Ongoing Assessment of Needs Other assessment/ plan:   Information/referral to community resources:   Sent pt information to SNF facilities in multiple regions    PATIENT'S/FAMILY'S RESPONSE TO PLAN OF CARE: Pt discussed being at a homeless shelter for a  short while before coming to the hospital.  Pt discussed past 4 months "being a mess" for him, he broke his ankle in October, had a stoke in November and became homeless in January.  Pt stated that he is wheelchair bound from his stroke and cannot use the left side of his body.  Pt stated that all of his family is out of state and he has no friends in [place]. Pt stated that he needs long term placement and help with most things other than eating.  Pt stated that he has been "down lately" but that he uses  prayer to help him.  Pt is learning to adjust to his paralysis which occurred from his stroke last year.  Pt grateful for help with placement and hopeful that CSW will find a SNF facility for him   .Dede Query, LCSW [facility] Clinical Social Worker - Weekend Coverage cell #: [phone]

## 2015-01-19 NOTE — Progress Notes (Signed)
[facility]                                                                              Patient Demographics  Cory [name], is a 54 y.o. male, DOB - [birth year], ZOX:[id number]  Admit date - 01/18/2015   Admitting Physician Cory DoyneAnastassia Doutova, MD  Outpatient Primary MD for the patient is No PCP Per Patient  LOS -    Chief Complaint  Patient presents with  . Arm Swelling  . Leg Swelling      HPI on 01/18/2015 by Dr. Therisa DoyneAnastassia [name] Cory SleightStephen Huster is a 54 y.o. Male has a past medical history of Stroke. Patient was living with a friend who has moved out. 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 [facility] was seen in ER [facility] was [facility] due to his inability for care for self. [place]. He reports some swelling in his left arm and leg that has been going on for some weeks. Dopplers were negative for DVT. ER attempted to contact social work but not available at this time. [facility] had to go back to [place] due to probation.   Hospitalist was called for admission for Social situation  Assessment & Plan   Inability to care for himself/Social Admission -Patient recently had CVA in November 2015 which left him with residual left-sided effects. Patient is mainly wheelchair-bound. -Upon reviewing the notes from inpatient rehabilitation, patient did need 24-hour supervision upon discharge as well as moderate to max assist. -Patient has a very unfortunate situation, he has presented to the emergency department a few times, at which point he was sent to homeless shelter. Patient was brought back by the homeless shelter yesterday evening as patient has been unable to take care of himself and they are unable to take your patient. -Social work and case management consulted -Spoke with patient's sister who lives in [place], she will try to come and  assist with patient's care.  History of CVA -Patient was admitted to inpatient rehab for over one month and was discharged 11/28/2014 -He had residual left sided effects -Patient recently presented to the ER 12/30/2014, CT head showed no acute intracranial abnormalities -Continue aspirin, statin  Left sided swelling -US of LLE showed no DVT, superficial thrombosis or Baker's cyst -Likely secondary to patient's recent CVA in November 2015  Alcohol abuse -Patient reports drinking one beer per day, currently denies any withdrawal type symptoms -Continue CIWA protocol, multivitamin, thiamine, folic acid  Hyperlipidemia -Continue statin  Code Status: Full  Family Communication: Sister via phone.  Attempted to contact daughter.   Disposition Plan: Admitted for observation. Pending placement  Time Spent in minutes   35 minutes  Procedures  LLE Doppler  Consults   None  DVT Prophylaxis  Lovenox  Lab Results  Component Value Date   PLT 220 01/18/2015    Medications  Scheduled Meds: . aspirin  325 mg Oral Daily  . atorvastatin  20 mg Oral q1800  . docusate sodium  100 mg Oral BID  . enoxaparin (LOVENOX) injection  40 mg Subcutaneous QHS  . folic acid  1 mg Oral Daily  . multivitamin with minerals  1 tablet Oral Daily  . pantoprazole  20 mg Oral Daily  . sodium chloride  3 mL Intravenous Q12H  . thiamine  100 mg Oral Daily  . tiZANidine  2 mg Oral TID   Continuous Infusions:  PRN Meds:.sodium chloride, acetaminophen **OR** acetaminophen, ondansetron **OR** ondansetron (ZOFRAN) IV, sodium chloride, traZODone  Antibiotics   Anti-infectives    None      Subjective:   Johan Creveling seen and examined today.  Patient complains of swelling in his left side. He denies any pain, chest pain shortness of breath, abdominal pain, dizziness, headache.  Objective:   Filed Vitals:   01/18/15 2200 01/18/15 2230 01/18/15 2320 01/19/15 0501  BP: 132/92 141/67 141/88 127/74    Pulse:   65 74  Temp:   98.8 F (37.1 C) 98 F (36.7 C)  TempSrc:   Oral Oral  Resp: Height:    (1.676 m)   Weight:   77.1 kg (169 lb 15.6 oz)   SpO2:   99% 100%    Wt Readings from Last 3 Encounters:  01/18/15 77.1 kg (169 lb 15.6 oz)  12/30/14 74.844 kg (165 lb)  11/27/14 74.889 kg (165 lb 1.6 oz)    No intake or output data in the 24 hours ending 01/19/15 1226  Exam  General: Well developed, well nourished, NAD  HEENT: NCAT,mucous membranes moist.   Cardiovascular: S1 S2 auscultated, no rubs, murmurs or gallops. Regular rate and rhythm.  Respiratory: Clear to auscultation bilaterally with equal chest rise  Abdomen: Soft, nontender, nondistended, + bowel sounds  Extremities: warm dry without cyanosis clubbing or edema  Neuro: AAOx3, Left sided weakness  Psych: Normal affect and demeanor   Data Review   Micro Results No results found for this or any previous visit (from the past 240 hour(s)).  Radiology Reports Dg Chest 2 View  12/30/2014   CLINICAL DATA:  Fall.  Anterior chest pain and LEFT shoulder pain.  EXAM: CHEST  2 VIEW  COMPARISON:  10/15/2014.  FINDINGS: Cardiopericardial silhouette within normal limits. Mediastinal contours normal. Trachea midline. No airspace disease or effusion. Partially translucent monitoring buttons project over the chest. Event recorder projects over the LEFT side of the heart.  No displaced rib fractures.  No pneumothorax.  IMPRESSION: No active cardiopulmonary disease.   Electronically Signed   By: Andreas Newport M.D.   On: 12/30/2014 18:15   Ct Head Wo Contrast  12/30/2014   CLINICAL DATA:  Patient became dizzy and fell out of wheelchair. Head and neck pain.  EXAM: CT HEAD WITHOUT CONTRAST  CT CERVICAL SPINE WITHOUT CONTRAST  TECHNIQUE: Multidetector CT imaging of the head and cervical spine was performed following the standard protocol without intravenous contrast. Multiplanar CT image reconstructions of the  cervical spine were also generated.  COMPARISON:  Head CT October 16, 2014  FINDINGS: CT HEAD FINDINGS  There is slight generalized atrophy with mild ex vacuo phenomenon involving the right lateral ventricle. There is no intracranial mass, hemorrhage, extra-axial fluid collection, or midline shift. There is evidence of a prior infarct involving most of the right lentiform nucleus as well as the right external and extreme capsules. There is a nearby prior infarct at the right frontal -temporal junction superiorly. There is also infarct involving much of the right temporal lobe as well as the superior right temporal -occipital junction.  A small amount of infarct is seen extending into the right parietal lobe. These infarcts are chronic and represent evolution of infarction seen on prior CT. Elsewhere gray-white compartments appear essentially unremarkable. No acute infarct is seen. The bony calvarium appears intact. The mastoid air cells are clear.  CT CERVICAL SPINE FINDINGS  There is no fracture or spondylolisthesis. Prevertebral soft tissues and predental space regions are normal. There is fairly severe disc space narrowing at C6-7. There is moderate disc space narrowing at C3-4, C5-6, and C7-T1. There is mild narrowing at other levels. There is moderate facet hypertrophy at C2-3 on the left. There is somewhat milder facet hypertrophy at most other levels bilaterally. There is no frank disc extrusion or stenosis. There is moderate exit foraminal narrowing due at several levels without nerve root edema or effacement appreciable.  IMPRESSION: CT head: Multiple areas of right-sided infarction as summarized above. Mild ex vacuo phenomenon right lateral ventricle. No acute infarct seen. No hemorrhage or mass effect.  CT cervical spine: Multilevel osteoarthritic change. No fracture or spondylolisthesis.   Electronically Signed   By: Bretta Bang M.D.   On: 12/30/2014 18:31   Ct Angio Chest Pe W/cm &/or Wo  Cm  12/30/2014   CLINICAL DATA:  Elevated D-dimer. Loop recorder. Fall today after dizziness.  EXAM: CT ANGIOGRAPHY CHEST WITH CONTRAST  TECHNIQUE: Multidetector CT imaging of the chest was performed using the standard protocol during bolus administration of intravenous contrast. Multiplanar CT image reconstructions and MIPs were obtained to evaluate the vascular anatomy.  CONTRAST:  80mL OMNIPAQUE IOHEXOL 350 MG/ML SOLN  COMPARISON:  12/30/2014  FINDINGS: No filling defect is identified in the pulmonary arterial tree to suggest pulmonary embolus. The appearance in the right lower lobe on image 118 of series 8 of possible tiny subsegmental embolus is shown on other planes to be volume averaging.  Atherosclerotic intimal thickening in the thoracic aorta without compelling findings of dissection, although aortic contrast opacification is not ideal due to the pulmonary arterial focus of today's exam. No pericardial effusion. Mild cardiomegaly. No pathologic thoracic adenopathy.  Subsegmental atelectasis in the posterior basal segment right lower lobe.  No thoracic compression fracture or other significant musculoskeletal abnormality is observed.  Review of the MIP images confirms the above findings.  IMPRESSION: 1. No filling defect is identified in the pulmonary arterial tree to suggest pulmonary embolus. 2. Atherosclerosis and mild cardiomegaly. 3. Subsegmental atelectasis, posterior basal segment right lower lobe.   Electronically Signed   By: Herbie Baltimore M.D.   On: 12/30/2014 20:03   Ct Cervical Spine Wo Contrast  12/30/2014   CLINICAL DATA:  Patient became dizzy and fell out of wheelchair. Head and neck pain.  EXAM: CT HEAD WITHOUT CONTRAST  CT CERVICAL SPINE WITHOUT CONTRAST  TECHNIQUE: Multidetector CT imaging of the head and cervical spine was performed following the standard protocol without intravenous contrast. Multiplanar CT image reconstructions of the cervical spine were also generated.   COMPARISON:  Head CT October 16, 2014  FINDINGS: CT HEAD FINDINGS  There is slight generalized atrophy with mild ex vacuo phenomenon involving the right lateral ventricle. There is no intracranial mass, hemorrhage, extra-axial fluid collection, or midline shift. There is evidence of a prior infarct involving most of the right lentiform nucleus as well as the right external and extreme capsules. There is a nearby prior infarct at the right frontal -temporal junction superiorly. There is also infarct involving much of the right temporal lobe as well as the superior  right temporal -occipital junction. A small amount of infarct is seen extending into the right parietal lobe. These infarcts are chronic and represent evolution of infarction seen on prior CT. Elsewhere gray-white compartments appear essentially unremarkable. No acute infarct is seen. The bony calvarium appears intact. The mastoid air cells are clear.  CT CERVICAL SPINE FINDINGS  There is no fracture or spondylolisthesis. Prevertebral soft tissues and predental space regions are normal. There is fairly severe disc space narrowing at C6-7. There is moderate disc space narrowing at C3-4, C5-6, and C7-T1. There is mild narrowing at other levels. There is moderate facet hypertrophy at C2-3 on the left. There is somewhat milder facet hypertrophy at most other levels bilaterally. There is no frank disc extrusion or stenosis. There is moderate exit foraminal narrowing due at several levels without nerve root edema or effacement appreciable.  IMPRESSION: CT head: Multiple areas of right-sided infarction as summarized above. Mild ex vacuo phenomenon right lateral ventricle. No acute infarct seen. No hemorrhage or mass effect.  CT cervical spine: Multilevel osteoarthritic change. No fracture or spondylolisthesis.   Electronically Signed   By: Bretta Bang M.D.   On: 12/30/2014 18:31   Dg Shoulder Left  12/30/2014   CLINICAL DATA:  Lost balance, acute fall,  anterior left shoulder and chest pain  EXAM: LEFT SHOULDER - 2+ VIEW  COMPARISON:  12/30/2014  FINDINGS: There is no evidence of fracture or dislocation. There is no evidence of arthropathy or other focal bone abnormality. Soft tissues are unremarkable. Cardiac recorder device noted.  IMPRESSION: No acute osseous finding.   Electronically Signed   By: Ruel Favors M.D.   On: 12/30/2014 18:13    CBC  Recent Labs Lab 01/18/15 1719  WBC 7.6  HGB 13.3  HCT 42.0  PLT 220  MCV 85.2  MCH 27.0  MCHC 31.7  RDW 13.8    Chemistries   Recent Labs Lab 01/18/15 1719 01/19/15 0407  NA 136  --   K 3.8  --   CL 104  --   CO2 28  --   GLUCOSE 93  --   BUN 13  --   CREATININE 0.84  --   CALCIUM 8.8  --   MG  --  1.7  AST 20  --   ALT 21  --   ALKPHOS 63  --   BILITOT 0.5  --    ------------------------------------------------------------------------------------------------------------------ estimated creatinine clearance is 97.1 mL/min (by C-G formula based on Cr of 0.84). ------------------------------------------------------------------------------------------------------------------ No results for input(s): HGBA1C in the last 72 hours. ------------------------------------------------------------------------------------------------------------------ No results for input(s): CHOL, HDL, LDLCALC, TRIG, CHOLHDL, LDLDIRECT in the last 72 hours. ------------------------------------------------------------------------------------------------------------------  Recent Labs  01/19/15 0407  TSH 1.338   ------------------------------------------------------------------------------------------------------------------ No results for input(s): VITAMINB12, FOLATE, FERRITIN, TIBC, IRON, RETICCTPCT in the last 72 hours.  Coagulation profile No results for input(s): INR, PROTIME in the last 168 hours.  No results for input(s): DDIMER in the last 72 hours.  Cardiac Enzymes No results for  input(s): CKMB, TROPONINI, MYOGLOBIN in the last 168 hours.  Invalid input(s): CK ------------------------------------------------------------------------------------------------------------------ Invalid input(s): POCBNP    Kassius Battiste D.O. on 01/19/2015 at 12:26 PM  Between 7am to 7pm - Pager - [phone]  After 7pm go to [email or web address] - password [email or web address]  And look for the night coverage person covering for me after hours  [facility] Office  [phone]

## 2015-01-19 NOTE — Clinical Social Work Placement (Signed)
Clinical Social Work Department CLINICAL SOCIAL WORK PLACEMENT NOTE 01/19/2015  Patient:  Naida SleightBURROUGHS,Chayson  Account Number:  [id number] Admit date:  01/18/2015  Clinical Social Worker:  Elray Bubaegina Shalise Rosado, CLINICAL SOCIAL WORKER  Date/time:  01/19/2015 02:44 PM  Clinical Social Work is seeking post-discharge placement for this patient at the following level of care:   SKILLED NURSING   (*CSW will update this form in Epic as items are completed)   01/19/2015  Patient/family provided with [facility] Department of Clinical Social Work's list of facilities offering this level of care within the geographic area requested by the patient (or if unable, by the patient's family).  01/19/2015  Patient/family informed of their freedom to choose among providers that offer the needed level of care, that participate in Medicare, Medicaid or managed care program needed by the patient, have an available bed and are willing to accept the patient.  01/19/2015  Patient/family informed of [facility]' ownership interest in [facility], as well as of the fact that they are under no obligation to receive care at this facility.  PASARR submitted to EDS on  PASARR number received on   FL2 transmitted to all facilities in geographic area requested by pt/family on  01/19/2015 FL2 transmitted to all facilities within larger geographic area on 01/19/2015  Patient informed that his/her managed care company has contracts with or will negotiate with  certain facilities, including the following:     Patient/family informed of bed offers received:   Patient chooses bed at  Physician recommends and patient chooses bed at    Patient to be transferred to  on   Patient to be transferred to facility by  Patient and family notified of transfer on  Name of family member notified:    The following physician request were entered in Epic:   Additional Comments:  .Elray Bubaegina Kashmir Leedy, LCSW [facility] Clinical Social Worker - Weekend Coverage cell #: [phone]

## 2015-01-19 NOTE — Progress Notes (Signed)
UR completed 

## 2015-01-20 DIAGNOSIS — I69898 Other sequelae of other cerebrovascular disease: Secondary | ICD-10-CM

## 2015-01-20 DIAGNOSIS — R208 Other disturbances of skin sensation: Secondary | ICD-10-CM

## 2015-01-20 MED ORDER — FOLIC ACID 1 MG PO TABS: 1.0000 mg | ORAL_TABLET | Freq: Every day | ORAL | Status: AC

## 2015-01-20 MED ORDER — ADULT MULTIVITAMIN W/MINERALS CH: 1.0000 | ORAL_TABLET | Freq: Every day | ORAL | Status: AC

## 2015-01-20 NOTE — Evaluation (Signed)
Physical Therapy Evaluation Patient Details Name: Cory SleightStephen Wrightson MRN: [id number] DOB: [birth year] Today's Date: 01/20/2015   History of Present Illness  Patient sp CVA in November 2015, In CIR x  about 1 month. 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 Patient was seen [facility] ER several times, [facility] but was sent back due to his inability for care for self. [place]. Dopplers were negative for DVT. [facility] had to go back to [place] due to probation.   Clinical Impression  ptient cooperative and appreciative for assistance to bathroom via WC. Patient has missing L  AFO, L Leg rest and  and appears maybe missing antitippers on his WC. Patient will benefit from PT to address problems listed in note below.     Follow Up Recommendations SNF;Supervision/Assistance - 24 hour    Equipment Recommendations   (needs his AFO for L foot and L leg rest and antitippers on WC.    Recommendations for Other Services       Precautions / Restrictions Precautions Precautions: Fall Precaution Comments: L hemiplegia,,       Mobility  Bed Mobility Overal bed mobility: Needs Assistance Bed Mobility: Supine to Sit     Supine to sit: Supervision     General bed mobility comments: extra time to get trunk upright from L side of bed.  Transfers Overall transfer level: Needs assistance   Transfers: Squat Pivot Transfers   Stand pivot transfers: Min guard;Min assist Squat pivot transfers: Min guard     General transfer comment: WC positioned next to bed, patient [facility] to his R [facility], did not remove armrest, able to pivot to turn and sit into WC. , then performed a partial  stand pivot with grab bar beside toilet for a toilet transfer with min guard and  assist for  underware.  Ambulation/Gait             General Gait Details: NT  Stairs            Wheelchair  Mobility    Modified Rankin (Stroke Patients Only)       Balance Overall balance assessment: Needs assistance;History of Falls Sitting-balance support: No upper extremity supported;Feet supported Sitting balance-Leahy Scale: Fair Sitting balance - Comments: static, did lean forward and pick up L leg with R hand and cross feet.   Standing balance support: Single extremity supported Standing balance-Leahy Scale: Poor                               Pertinent Vitals/Pain Pain Assessment: No/denies pain    Home Living Family/patient expects to be discharged to:: Skilled nursing facility               Home Equipment: Wheelchair - manual Additional Comments: patient reports that he  had a brace but does not know where it is after DC and having had to move out of a house after DC from CIR. PT notes indicate  patient had an AFO.    Prior Function Level of Independence: Needs assistance   Gait / Transfers Assistance Needed: WC dependent, tansfers with supervision and set up for safety/ Notes  from CIR indicate that patient was working on Ambulation with [facility] Supervision for safety,.  Hand Dominance   Dominant Hand: Right    Extremity/Trunk Assessment               Lower Extremity Assessment: LLE deficits/detail   LLE Deficits / Details: gross hip flexion for repositioning, no ankle/foot movement, ankle posturing in equino varus.increased plantar flexion/clonus     Communication   Communication: No difficulties  Cognition Arousal/Alertness: Awake/alert Behavior During Therapy: Impulsive Overall Cognitive Status: Within Functional Limits for tasks assessed                      General Comments      Exercises        Assessment/Plan    PT Assessment Patient needs continued PT services  PT Diagnosis Hemiplegia non-dominant side   PT Problem List Decreased strength;Decreased range of  motion;Decreased balance;Decreased mobility;Decreased knowledge of precautions;Decreased safety awareness;Decreased knowledge of use of DME;Impaired sensation;Impaired tone  PT Treatment Interventions DME instruction;Gait training;Functional mobility training;Therapeutic activities;Therapeutic exercise;Wheelchair mobility training;Patient/family education;Neuromuscular re-education   PT Goals (Current goals can be found in the Care Plan section) Acute Rehab PT Goals Patient Stated Goal: to get more therapy. PT Goal Formulation: With patient Time For Goal Achievement: 02/03/15 Potential to Achieve Goals: Good    Frequency Min 3X/week   Barriers to discharge Decreased caregiver support      Co-evaluation               End of Session   Activity Tolerance: Patient tolerated treatment well Patient left:  (on toilet with OT at the door.)      Functional Assessment Tool Used: clinical judgement. Functional Limitation: Mobility: Walking and moving around;Changing and maintaining body position Mobility: Walking and Moving Around Current Status [phone]): At least 20 percent but less than 40 percent impaired, limited or restricted Mobility: Walking and Moving Around Goal Status [phone]): At least 1 percent but less than 20 percent impaired, limited or restricted Changing and Maintaining Body Position Current Status (U9811): At least 20 percent but less than 40 percent impaired, limited or restricted Changing and Maintaining Body Position Goal Status (B1478): 0 percent impaired, limited or restricted    Time: 0815-0825 PT Time Calculation (min) (ACUTE ONLY): 10 min   Charges:   PT Evaluation $Initial PT Evaluation Tier I: 1 Procedure     PT G Codes:   PT G-Codes **NOT FOR INPATIENT CLASS** Functional Assessment Tool Used: clinical judgement. Functional Limitation: Mobility: Walking and moving around;Changing and maintaining body position Mobility: Walking and Moving Around Current  Status [phone]): At least 20 percent but less than 40 percent impaired, limited or restricted Mobility: Walking and Moving Around Goal Status [phone]): At least 1 percent but less than 20 percent impaired, limited or restricted Changing and Maintaining Body Position Current Status (V7846): At least 20 percent but less than 40 percent impaired, limited or restricted Changing and Maintaining Body Position Goal Status (N6295): 0 percent impaired, limited or restricted    Rada Hay 01/20/2015, 9:28 AM Blanchard Kelch PT [phone]

## 2015-01-20 NOTE — Evaluation (Deleted)
Occupational Therapy Evaluation Patient Details Name: Cory [name] MRN: [id number] DOB: 02/08/1961 Today's Date: 01/20/2015    History of Present Illness Patient sp CVA in November 2015, In CIR x  about 1 month. 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 Patient was seen in ER several times, once after fall from [facility].Was discharged to [facility] shelter but was sent back due to his inability for care for self. Family lives in [place] and [place]. Marland Kitchen Dopplers were negative for DVT. Patient was getting some help in [facility] but had to go back to [place] due to probation.    Clinical Impression   Pt was assisted for all ADL prior to admission and was dependent on a w/c for mobility.  Pt presents with impulsivity and L inattention.  He requires min assist for ADL transfers and mod to max assist for bathing and dressing.  Pt has minimal support of family and friends and will need 24 hour care.  Recommending SNF.    Follow Up Recommendations  SNF;Supervision/Assistance - 24 hour    Equipment Recommendations       Recommendations for Other Services       Precautions / Restrictions Precautions Precautions: Fall Precaution Comments: L hemiplegia Restrictions Weight Bearing Restrictions: No      Mobility Bed Mobility Pt on toilet upon OTs arrival  Transfers Overall transfer level: Needs assistance Equipment used:  (WC) Transfers: Squat Pivot Transfers   Stand pivot transfers: Min assist Squat pivot transfers: Min guard     General transfer comment: Pt locked brakes and transfered to R from toilet to w/c.    Balance Overall balance assessment: Needs assistance;History of Falls Sitting-balance support: No upper extremity supported;Feet supported Sitting balance-Leahy Scale: Fair Sitting balance - Comments: static, did lean forward and pick up L leg with R hand and cross feet.   Standing balance support: Single extremity supported Standing balance-Leahy Scale: Poor                               ADL Overall ADL's : Needs assistance/impaired Eating/Feeding: Set up;Sitting   Grooming: Wash/dry face;Set up;Sitting   Upper Body Bathing: Sitting;Moderate assistance Upper Body Bathing Details (indicate cue type and reason): decreased thoroughness Lower Body Bathing: Maximal assistance;Sit to/from stand   Upper Body Dressing : Moderate assistance;Sitting Upper Body Dressing Details (indicate cue type and reason): pt able to verbalize hemitechniques Lower Body Dressing: Maximal assistance;Sit to/from stand   Toilet Transfer: Minimal assistance;Stand-pivot;Comfort height toilet (to w/c)   Toileting- Clothing Manipulation and Hygiene: Total assistance;Sit to/from stand Toileting - Clothing Manipulation Details (indicate cue type and reason): assisted for pericare and managing underwear     Functional mobility during ADLs: Wheelchair General ADL Comments: Pt unable to stand without support to leave R UE available to perform ADL.     Vision Additional Comments: L inattention   Perception     Praxis      Pertinent Vitals/Pain Pain Assessment: No/denies pain     Hand Dominance Right   Extremity/Trunk Assessment Upper Extremity Assessment Upper Extremity Assessment: LUE deficits/detail LUE Deficits / Details: dense hemiplegia with flexor tone, no functional use observed, L inattention LUE Coordination: decreased fine motor;decreased gross motor   Lower Extremity Assessment Lower Extremity Assessment: Defer to PT evaluation LLE Deficits / Details: gross hip flexion for repositioning, no ankle/foot movement, ankle posturing in equino varus.increased plantar flexion/clonus       Communication Communication Communication: No  difficulties   Cognition Arousal/Alertness: Awake/alert Behavior During Therapy: Impulsive Overall Cognitive Status: Within Functional Limits for tasks assessed                     General Comments        Exercises       Shoulder Instructions      Home Living Family/patient expects to be discharged to:: Skilled nursing facility                             Home Equipment: Wheelchair - manual   Additional Comments: patient reports that he  had a brace but does not know where it is after DC and having had to move out of a house after DC from CIR. PT notes indicate  patient had an AFO.      Prior Functioning/Environment Level of Independence: Needs assistance  Gait / Transfers Assistance Needed: WC dependent, transfers with supervision and set up for safety/ Notes  from CIR indicate that patient was working on Ambulation with [facility] Supervision for safety,. ADL's / Homemaking Assistance Needed: Pt was assisted for bathing, dressing and toileting.  Able to self feed and performing grooming with set up.        OT Diagnosis: Generalized weakness;Cognitive deficits;Hemiplegia non-dominant side   OT Problem List: Decreased range of motion;Decreased activity tolerance;Decreased strength;Impaired balance (sitting and/or standing);Decreased coordination;Decreased safety awareness;Impaired UE functional use   OT Treatment/Interventions: Self-care/ADL training;Therapeutic activities;Patient/family education;Balance training;DME and/or AE instruction    OT Goals(Current goals can be found in the care plan section) Acute Rehab OT Goals Patient Stated Goal: to get more therapy. OT Goal Formulation: With patient Time For Goal Achievement: 02/03/15 Potential to Achieve Goals: Good ADL Goals Pt Will Perform Grooming: with supervision;sitting (at sink in w/c) Pt Will Perform Upper Body Bathing: with min assist;sitting Pt Will Perform Upper Body Dressing: with supervision;sitting Pt Will Transfer to Toilet: with min guard assist;stand pivot transfer;regular height toilet;grab bars  OT Frequency: Min 2X/week   Barriers to D/C: Decreased caregiver  support          Co-evaluation              End of Session    Activity Tolerance:   Patient left: Other (comment) (w/c with PT)   Time: [id number] OT Time Calculation (min): 17 min Charges:  OT General Charges $OT Visit: 1 Procedure OT Evaluation $Initial OT Evaluation Tier I: 1 Procedure G-Codes:    Evern BioMayberry, Jalee Saine Lynn 01/20/2015, 10:53 AM  [phone]
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
## 2015-01-20 NOTE — Progress Notes (Signed)
CSW continuing to follow.   Per MD, pt medically ready for discharge.   CSW spoke with [facility], [place]ammy Blakely and facility is reviewing information.   CSW [facility] with [facility], Mal AmabileBrock and facility is reviewing pt information.   CSW followed up with pt at bedside to gather additional information to assist this CSW with placement.   CSW inquired with pt if he has been approved SSI disability and pt states that he has been approved, but has not yet begin receiving his disability checks. Pt stated that pt daughter who lives in [place] and pt sister who lives in [place] have been assisting him in this process.   CSW discussed with pt that [facility] will not be an option for placement as pt does not yet have long term Medicaid. CSW explained to pt that CSW cannot guarantee that [place] and pt will likely have to be sent out of county for placement. Pt shared that he is currently on probation, but is unsure of the parameters about being able to leave [place] or not. Pt provided permission to contact his probation officer and provided this CSW with contact information for his probation officer, Officer Harris in order for CSW to gather more clarification surrounding pt probation. Pt shared that pt daughter, Modena Nunneryyisha will be able to provide more detailed information regarding Medicaid/Disability as she has been assisting pt to apply. Pt provided permission for CSW to contact pt daughter.   CSW contacted pt daughter, Modena Nunneryyisha via telephone. CSW introduced self and explained role. Pt daughter shared that pt daughter and pt sister have been working with Albertina ParrMichael Caldwell [phone]) through the [facility] of Social [place] regarding long term Medicaid and Social Security disability for pt. Pt daughter stated that pt has been approved disability, but has not yet started receiving disability checks. CSW inquired with pt  daughter re: pt probation and pt daughter stated that she is unsure how long pt probation is for and her understanding is that pt just has to remain in the [place], but could go [place]. CSW explained to pt daughter that CSW will update pt daughter once bed located, but pt placement could be within anywhere [place] that CSW is able to secure a bed. Pt daughter expressed understanding.   CSW contacted pt parole officer, Officer Harris and left voice message. Awaiting return phone call.  CSW contacted Albertina ParrMichael Caldwell [phone]) with the Department of Social Services to get clarification surround if pt has bene approved long term Medicaid. Awaiting return phone call.  CSW staffed case with CSW department Assistant Director, Wandra MannanZack Brooks who recommended contacting Wellsite geologistmedical director, Dr. Jacky KindleAronson to inquire about other options for placement for pt as pt will have to be a Letter of Guarantee (LOG) until long term Medicaid is approved or confirmed. CSW contacted Dr. Jacky KindleAronson and left voice message. Awaiting return phone call.  CSW to continue to follow to arrange placement for pt to SNF.  Loletta SpecterSuzanna Ling Flesch, MSW, LCSW Clinical Social Work [phone]

## 2015-01-20 NOTE — Progress Notes (Signed)
Late entry due to missed g-code.   01/20/15 1100  OT G-codes **NOT FOR INPATIENT CLASS**  Functional Assessment Tool Used clincal judgement  Functional Limitation Self care  Self Care Current Status (Z6109(G8987) CL  Self Care Goal Status (U0454(G8988) CJ

## 2015-01-20 NOTE — Discharge Instructions (Signed)
Stroke Prevention Some medical conditions and behaviors are associated with an increased chance of having a stroke. You may prevent a stroke by making healthy choices and managing medical conditions. HOW CAN I REDUCE MY RISK OF HAVING A STROKE?   Stay physically active. Get at least 30 minutes of activity on most or all days.  Do not smoke. It may also be helpful to avoid exposure to secondhand smoke.  Limit alcohol use. Moderate alcohol use is considered to be:  No more than 2 drinks per day for men.  No more than 1 drink per day for nonpregnant women.  Eat healthy foods. This involves:  Eating 5 or more servings of fruits and vegetables a day.  Making dietary changes that address high blood pressure (hypertension), high cholesterol, diabetes, or obesity.  Manage your cholesterol levels.  Making food choices that are high in fiber and low in saturated fat, trans fat, and cholesterol may control cholesterol levels.  Take any prescribed medicines to control cholesterol as directed by your health care provider.  Manage your diabetes.  Controlling your carbohydrate and sugar intake is recommended to manage diabetes.  Take any prescribed medicines to control diabetes as directed by your health care provider.  Control your hypertension.  Making food choices that are low in salt (sodium), saturated fat, trans fat, and cholesterol is recommended to manage hypertension.  Take any prescribed medicines to control hypertension as directed by your health care provider.  Maintain a healthy weight.  Reducing calorie intake and making food choices that are low in sodium, saturated fat, trans fat, and cholesterol are recommended to manage weight.  Stop drug abuse.  Avoid taking birth control pills.  Talk to your health care provider about the risks of taking birth control pills if you are over 35 years old, smoke, get migraines, or have ever had a blood clot.  Get evaluated for sleep  disorders (sleep apnea).  Talk to your health care provider about getting a sleep evaluation if you snore a lot or have excessive sleepiness.  Take medicines only as directed by your health care provider.  For some people, aspirin or blood thinners (anticoagulants) are helpful in reducing the risk of forming abnormal blood clots that can lead to stroke. If you have the irregular heart rhythm of atrial fibrillation, you should be on a blood thinner unless there is a good reason you cannot take them.  Understand all your medicine instructions.  Make sure that other conditions (such as anemia or atherosclerosis) are addressed. SEEK IMMEDIATE MEDICAL CARE IF:   You have sudden weakness or numbness of the face, arm, or leg, especially on one side of the body.  Your face or eyelid droops to one side.  You have sudden confusion.  You have trouble speaking (aphasia) or understanding.  You have sudden trouble seeing in one or both eyes.  You have sudden trouble walking.  You have dizziness.  You have a loss of balance or coordination.  You have a sudden, severe headache with no known cause.  You have new chest pain or an irregular heartbeat. Any of these symptoms may represent a serious problem that is an emergency. Do not wait to see if the symptoms will go away. Get medical help at once. Call your local emergency services (911 in [place] Do not drive yourself to the hospital. Document Released: 12/16/2004 Document Revised: 03/25/2014 Document Reviewed: 05/11/2013 ExitCare Patient Information 2015 [employer]. This information is not intended to replace advice given   to you by your health care provider. Make sure you discuss any questions you have with your health care provider.  

## 2015-01-20 NOTE — Progress Notes (Signed)
UR completed 
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
## 2015-01-20 NOTE — Discharge Summary (Signed)
Physician Discharge Summary  Cory [name] ZOX:[id number] DOB: 09-Mar-1961 DOA: 01/18/2015  PCP: No PCP Per Patient  Admit date: 01/18/2015 Discharge date: 01/20/2015  Time spent: 35 minutes  Recommendations for Outpatient Follow-up:  Patient will be discharged to home with home health, PT, RN, OT.  Patient should continue physical as well as occupational therapy is recommended by the nursing facility. Patient should continue his medications as prescribed.  He will need to follow-up with the physician at the nursing facility. Patient should refrain from alcohol use.   Discharge Diagnoses:  Ability to care for himself/social admission History of CVA Left-sided swelling Alcohol abuse Hyperlipidemia  Discharge Condition: Stable  Diet recommendation: Regular  Filed Weights   01/18/15 2320  Weight: 77.1 kg (169 lb 15.6 oz)    History of present illness:  on 01/18/2015 by Dr. Therisa Doyne Cory [name] is a 54 y.o. Male has a past medical history of Stroke. Patient was living with a friend who has moved out. 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 Patient was seen in ER and was discharged to [facility] shelter but was sent back due to his inability for care for self. Family lives in [place] and [place]. He reports some swelling in his left arm and leg that has been going on for some weeks. Dopplers were negative for DVT. ER attempted to contact social work but not available at this time. Patient was getting some help in [facility] but had to go back to [place] due to probation.  Hospitalist was called for admission for Social situation  Hospital Course:  Inability to care for himself/Social Admission -Patient recently had CVA in November 2015 which left him with residual left-sided effects. Patient is mainly wheelchair-bound. -Upon reviewing the notes from inpatient rehabilitation, patient did need 24-hour supervision upon discharge as well as  moderate to max assist. -Patient has a very unfortunate situation, he has presented to the emergency department a few times, at which point he was sent to homeless shelter. Patient was brought back by the homeless shelter yesterday evening as patient has been unable to take care of himself and they are unable to take your patient. -Social work and case management consulted -Spoke with patient's sister who lives in [place], she will try to come and assist with patient's care. -PT consulted and recommended SNF, ASO for left foot and let, antitippers on WC  History of CVA -Patient was admitted to inpatient rehab for over one month and was discharged 11/28/2014 -He had residual left sided effects -Patient recently presented to the ER 12/30/2014, CT head showed no acute intracranial abnormalities -Continue aspirin, statin  Left sided swelling -[place] of LLE showed no DVT, superficial thrombosis or Baker's cyst -Likely secondary to patient's recent CVA in November 2015  Alcohol abuse -Patient reports drinking one beer per day, currently denies any withdrawal type symptoms -Continue CIWA protocol, multivitamin, thiamine, folic acid  Hyperlipidemia -Continue statin  Homelessness -Unfortunately situation.  Patient is currently on probation and must a [place]. Paperwork has been started to transfer patient to [place] however this may take up to 45 days according to the parole officer. Patient does have a sister in [place] as well as a daughter in [place]. -Patient recently was brought to [facility] from homeless shelter. Unfortunately unable to find placement for patient.  Procedures  LLE Doppler  Consults  None  Discharge Exam: Filed Vitals:   01/20/15 0456  BP: 135/95  Pulse: 65  Temp: 97.8 F (36.6 C)  Resp: 16   Exam  General: Well developed, well nourished, NAD  Cardiovascular: S1 S2 auscultated, RRR  Respiratory: Clear to auscultation bilaterally with  equal chest rise  Abdomen: Soft, nontender, nondistended, + bowel sounds  Extremities: warm dry without cyanosis clubbing or edema  Neuro: AAOx3, Left sided weakness  Discharge Instructions      Discharge Instructions    Discharge instructions    Complete by:  As directed   Patient will be discharged to nursing facility.  Patient should continue physical as well as occupational therapy is recommended by the nursing facility. Patient should continue his medications as prescribed.  He will need to follow-up with the physician at the nursing facility. Patient should refrain from alcohol use.            Medication List    TAKE these medications        aspirin 325 MG tablet  Take 1 tablet (325 mg total) by mouth once.     atorvastatin 20 MG tablet  Commonly known as:  LIPITOR  Take 1 tablet (20 mg total) by mouth daily at 6 PM.     folic acid 1 MG tablet  Commonly known as:  FOLVITE  Take 1 tablet (1 mg total) by mouth daily.     multivitamin with minerals Tabs tablet  Take 1 tablet by mouth daily.     pantoprazole 20 MG tablet  Commonly known as:  PROTONIX  Take 1 tablet (20 mg total) by mouth daily.     senna-docusate 8.6-50 MG per tablet  Commonly known as:  Senokot-S  Take 2 tablets by mouth at bedtime.     thiamine 100 MG tablet  Take 1 tablet (100 mg total) by mouth daily.     tiZANidine 2 MG tablet  Commonly known as:  ZANAFLEX  Take 1 tablet (2 mg total) by mouth 3 (three) times daily.       No Known Allergies Follow-up Information    Follow up with Physician at nursing facility. Schedule an appointment as soon as possible for a visit in 3 days.   Why:  Hospital follow-up       The results of significant diagnostics from this hospitalization (including imaging, microbiology, ancillary and laboratory) are listed below for reference.    Significant Diagnostic Studies: Dg Chest 2 View  12/30/2014   CLINICAL DATA:  Fall.  Anterior chest pain and LEFT  shoulder pain.  EXAM: CHEST  2 VIEW  COMPARISON:  10/15/2014.  FINDINGS: Cardiopericardial silhouette within normal limits. Mediastinal contours normal. Trachea midline. No airspace disease or effusion. Partially translucent monitoring buttons project over the chest. Event recorder projects over the LEFT side of the heart.  No displaced rib fractures.  No pneumothorax.  IMPRESSION: No active cardiopulmonary disease.   Electronically Signed   By: Andreas Newport M.D.   On: 12/30/2014 18:15   Ct Head Wo Contrast  12/30/2014   CLINICAL DATA:  Patient became dizzy and fell out of wheelchair. Head and neck pain.  EXAM: CT HEAD WITHOUT CONTRAST  CT CERVICAL SPINE WITHOUT CONTRAST  TECHNIQUE: Multidetector CT imaging of the head and cervical spine was performed following the standard protocol without intravenous contrast. Multiplanar CT image reconstructions of the cervical spine were also generated.  COMPARISON:  Head CT October 16, 2014  FINDINGS: CT HEAD FINDINGS  There is slight generalized atrophy with mild ex vacuo phenomenon involving the right lateral ventricle. There  is no intracranial mass, hemorrhage, extra-axial fluid collection, or midline shift. There is evidence of a prior infarct involving most of the right lentiform nucleus as well as the right external and extreme capsules. There is a nearby prior infarct at the right frontal -temporal junction superiorly. There is also infarct involving much of the right temporal lobe as well as the superior right temporal -occipital junction. A small amount of infarct is seen extending into the right parietal lobe. These infarcts are chronic and represent evolution of infarction seen on prior CT. Elsewhere gray-white compartments appear essentially unremarkable. No acute infarct is seen. The bony calvarium appears intact. The mastoid air cells are clear.  CT CERVICAL SPINE FINDINGS  There is no fracture or spondylolisthesis. Prevertebral soft tissues and predental  space regions are normal. There is fairly severe disc space narrowing at C6-7. There is moderate disc space narrowing at C3-4, C5-6, and C7-T1. There is mild narrowing at other levels. There is moderate facet hypertrophy at C2-3 on the left. There is somewhat milder facet hypertrophy at most other levels bilaterally. There is no frank disc extrusion or stenosis. There is moderate exit foraminal narrowing due at several levels without nerve root edema or effacement appreciable.  IMPRESSION: CT head: Multiple areas of right-sided infarction as summarized above. Mild ex vacuo phenomenon right lateral ventricle. No acute infarct seen. No hemorrhage or mass effect.  CT cervical spine: Multilevel osteoarthritic change. No fracture or spondylolisthesis.   Electronically Signed   By: Bretta BangWilliam  Woodruff M.D.   On: 12/30/2014 18:31   Ct Angio Chest Pe W/cm &/or Wo Cm  12/30/2014   CLINICAL DATA:  Elevated D-dimer. Loop recorder. Fall today after dizziness.  EXAM: CT ANGIOGRAPHY CHEST WITH CONTRAST  TECHNIQUE: Multidetector CT imaging of the chest was performed using the standard protocol during bolus administration of intravenous contrast. Multiplanar CT image reconstructions and MIPs were obtained to evaluate the vascular anatomy.  CONTRAST:  80mL OMNIPAQUE IOHEXOL 350 MG/ML SOLN  COMPARISON:  12/30/2014  FINDINGS: No filling defect is identified in the pulmonary arterial tree to suggest pulmonary embolus. The appearance in the right lower lobe on image 118 of series 8 of possible tiny subsegmental embolus is shown on other planes to be volume averaging.  Atherosclerotic intimal thickening in the thoracic aorta without compelling findings of dissection, although aortic contrast opacification is not ideal due to the pulmonary arterial focus of today's exam. No pericardial effusion. Mild cardiomegaly. No pathologic thoracic adenopathy.  Subsegmental atelectasis in the posterior basal segment right lower lobe.  No thoracic  compression fracture or other significant musculoskeletal abnormality is observed.  Review of the MIP images confirms the above findings.  IMPRESSION: 1. No filling defect is identified in the pulmonary arterial tree to suggest pulmonary embolus. 2. Atherosclerosis and mild cardiomegaly. 3. Subsegmental atelectasis, posterior basal segment right lower lobe.   Electronically Signed   By: Herbie BaltimoreWalt  Liebkemann M.D.   On: 12/30/2014 20:03   Ct Cervical Spine Wo Contrast  12/30/2014   CLINICAL DATA:  Patient became dizzy and fell out of wheelchair. Head and neck pain.  EXAM: CT HEAD WITHOUT CONTRAST  CT CERVICAL SPINE WITHOUT CONTRAST  TECHNIQUE: Multidetector CT imaging of the head and cervical spine was performed following the standard protocol without intravenous contrast. Multiplanar CT image reconstructions of the cervical spine were also generated.  COMPARISON:  Head CT October 16, 2014  FINDINGS: CT HEAD FINDINGS  There is slight generalized atrophy with mild ex vacuo phenomenon involving the  right lateral ventricle. There is no intracranial mass, hemorrhage, extra-axial fluid collection, or midline shift. There is evidence of a prior infarct involving most of the right lentiform nucleus as well as the right external and extreme capsules. There is a nearby prior infarct at the right frontal -temporal junction superiorly. There is also infarct involving much of the right temporal lobe as well as the superior right temporal -occipital junction. A small amount of infarct is seen extending into the right parietal lobe. These infarcts are chronic and represent evolution of infarction seen on prior CT. Elsewhere gray-white compartments appear essentially unremarkable. No acute infarct is seen. The bony calvarium appears intact. The mastoid air cells are clear.  CT CERVICAL SPINE FINDINGS  There is no fracture or spondylolisthesis. Prevertebral soft tissues and predental space regions are normal. There is fairly severe  disc space narrowing at C6-7. There is moderate disc space narrowing at C3-4, C5-6, and C7-T1. There is mild narrowing at other levels. There is moderate facet hypertrophy at C2-3 on the left. There is somewhat milder facet hypertrophy at most other levels bilaterally. There is no frank disc extrusion or stenosis. There is moderate exit foraminal narrowing due at several levels without nerve root edema or effacement appreciable.  IMPRESSION: CT head: Multiple areas of right-sided infarction as summarized above. Mild ex vacuo phenomenon right lateral ventricle. No acute infarct seen. No hemorrhage or mass effect.  CT cervical spine: Multilevel osteoarthritic change. No fracture or spondylolisthesis.   Electronically Signed   By: Bretta Bang M.D.   On: 12/30/2014 18:31   Dg Shoulder Left  12/30/2014   CLINICAL DATA:  Lost balance, acute fall, anterior left shoulder and chest pain  EXAM: LEFT SHOULDER - 2+ VIEW  COMPARISON:  12/30/2014  FINDINGS: There is no evidence of fracture or dislocation. There is no evidence of arthropathy or other focal bone abnormality. Soft tissues are unremarkable. Cardiac recorder device noted.  IMPRESSION: No acute osseous finding.   Electronically Signed   By: Ruel Favors M.D.   On: 12/30/2014 18:13    Microbiology: No results found for this or any previous visit (from the past 240 hour(s)).   Labs: Basic Metabolic Panel:  Recent Labs Lab 01/18/15 1719 01/19/15 0407  NA 136  --   K 3.8  --   CL 104  --   CO2 28  --   GLUCOSE 93  --   BUN 13  --   CREATININE 0.84  --   CALCIUM 8.8  --   MG  --  1.7  PHOS  --  4.5   Liver Function Tests:  Recent Labs Lab 01/18/15 1719  AST 20  ALT 21  ALKPHOS 63  BILITOT 0.5  PROT 7.7  ALBUMIN 3.8   No results for input(s): LIPASE, AMYLASE in the last 168 hours. No results for input(s): AMMONIA in the last 168 hours. CBC:  Recent Labs Lab 01/18/15 1719  WBC 7.6  HGB 13.3  HCT 42.0  MCV 85.2  PLT 220     Cardiac Enzymes: No results for input(s): CKTOTAL, CKMB, CKMBINDEX, TROPONINI in the last 168 hours. BNP: BNP (last 3 results)  Recent Labs  01/18/15 1719  BNP 88.6    ProBNP (last 3 results) No results for input(s): PROBNP in the last 8760 hours.  CBG: No results for input(s): GLUCAP in the last 168 hours.     SignedEdsel Petrin  [facility] 01/20/2015, 9:39 AM

## 2015-01-21 ENCOUNTER — Telehealth: Payer: Self-pay | Admitting: Internal Medicine

## 2015-01-21 NOTE — Care Management Note (Signed)
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 Hx of CVA     Action/Plan:   From homeless shelter   Anticipated DC Date:  01/20/2015   Anticipated DC Plan:  HOME W HOME HEALTH SERVICES  In-house referral  Clinical Social Worker      DC Associate Professorlanning Services  CM consult      [facility]   Choice offered to / List presented to:  C-1 Patient        HH arranged  HH-1 RN  HH-2 PT  HH-3 OT      [facility]  [facility]   Status of service:  Completed, signed off Medicare Important Message given?   (If response is "NO", the following Medicare IM given date fields will be blank) Date Medicare IM given:   Medicare IM given by:   Date Additional Medicare IM given:   Additional Medicare IM given by:    Discharge Disposition:    Per UR Regulation:  Reviewed for med. necessity/level of care/duration of stay  If discussed at Long Length of Stay Meetings, dates discussed:    Comments:  01/20/15 Sandford Crazeora Decari Duggar RN,BSN,NCM [phone] This Cm was alerted by CSW that pt would be dc'd to a hotel [facility] to him not being able to go to a SNF.  Pt has used [facility] in the past and would like to use them again.  [facility] given referral and [facility] rep given the specific hotel and address where pt will be located.

## 2015-01-21 NOTE — Telephone Encounter (Signed)
Advised that patient will need to get a PCP for future orders.  Cory [name] verbalized understanding.

## 2015-01-21 NOTE — Telephone Encounter (Signed)
New message     Patient does not have a PCP.    Will Dr. Graciela HusbandsKlein be willing to sign off any future orders.

## 2015-01-22 NOTE — Progress Notes (Signed)
Per Wellsite geologistMedical director and CSW director, Pt ineligible for placement from the emergency department and inpatient units due to previous admissions.   Olga CoasterKristen Shadrack Brummitt, LCSW  Clinical Social Work  [facility] [phone]  01/22/2015 3:00 PM

## 2015-01-24 ENCOUNTER — Encounter: Payer: Self-pay | Admitting: Cardiology

## 2015-01-31 ENCOUNTER — Encounter: Payer: Self-pay | Admitting: Cardiology

## 2015-02-07 ENCOUNTER — Encounter: Payer: Self-pay | Admitting: Cardiology

## 2015-02-10 ENCOUNTER — Encounter: Payer: Self-pay | Admitting: Internal Medicine

## 2015-02-14 ENCOUNTER — Encounter: Payer: Self-pay | Admitting: Cardiology

## 2015-02-21 ENCOUNTER — Encounter: Payer: Self-pay | Admitting: Cardiology

## 2015-02-28 ENCOUNTER — Encounter: Payer: Self-pay | Admitting: Cardiology

## 2015-03-05 ENCOUNTER — Ambulatory Visit: Payer: Medicaid Other | Attending: Internal Medicine

## 2015-03-13 ENCOUNTER — Encounter: Payer: Self-pay | Admitting: Cardiology

## 2015-03-14 ENCOUNTER — Other Ambulatory Visit: Payer: Self-pay | Admitting: Physical Medicine and Rehabilitation

## 2015-03-17 NOTE — Telephone Encounter (Signed)
Pt was discharged from the hospital by Dr. Wynn BankerKirsteins. Pt has not been seen in clinic by Dr. Wynn BankerKirsteins and pt has no future appts.

## 2016-08-17 IMAGING — DX DG ANKLE COMPLETE 3+V*L*
3 series · 3 of 3 positions shown · non-contrast
Comparison: 09/03/2014

CLINICAL DATA: Fracture, follow-up

EXAM:
LEFT ANKLE COMPLETE - 3+ VIEW

[ankle ap]
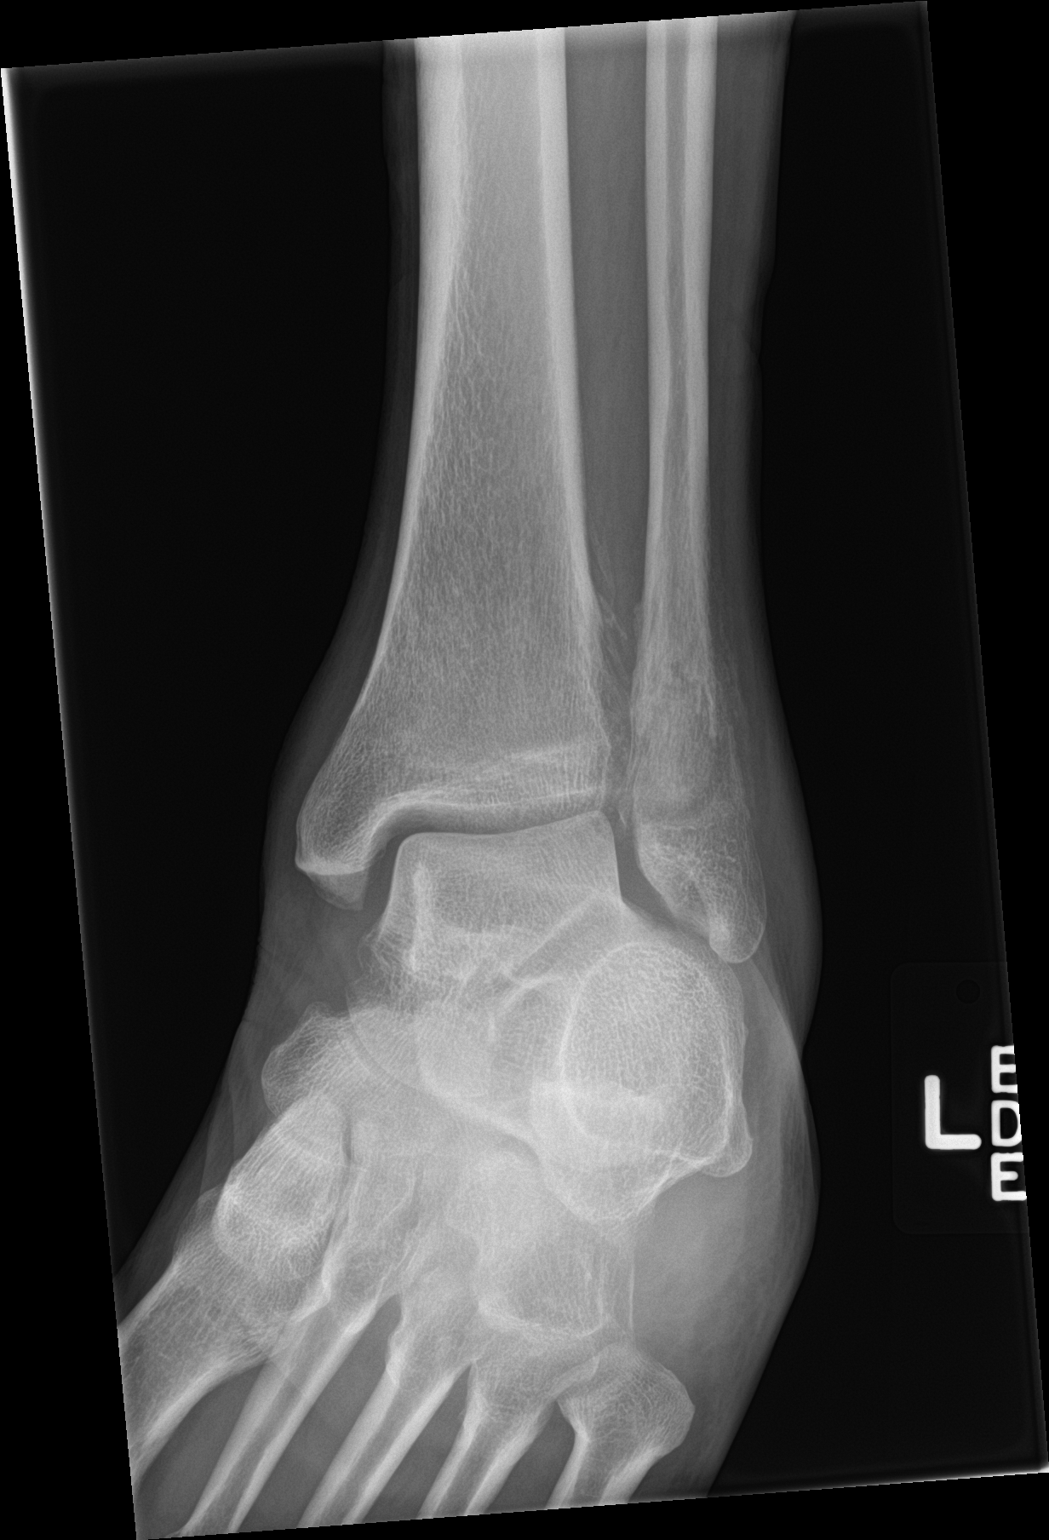

[ankle obl]
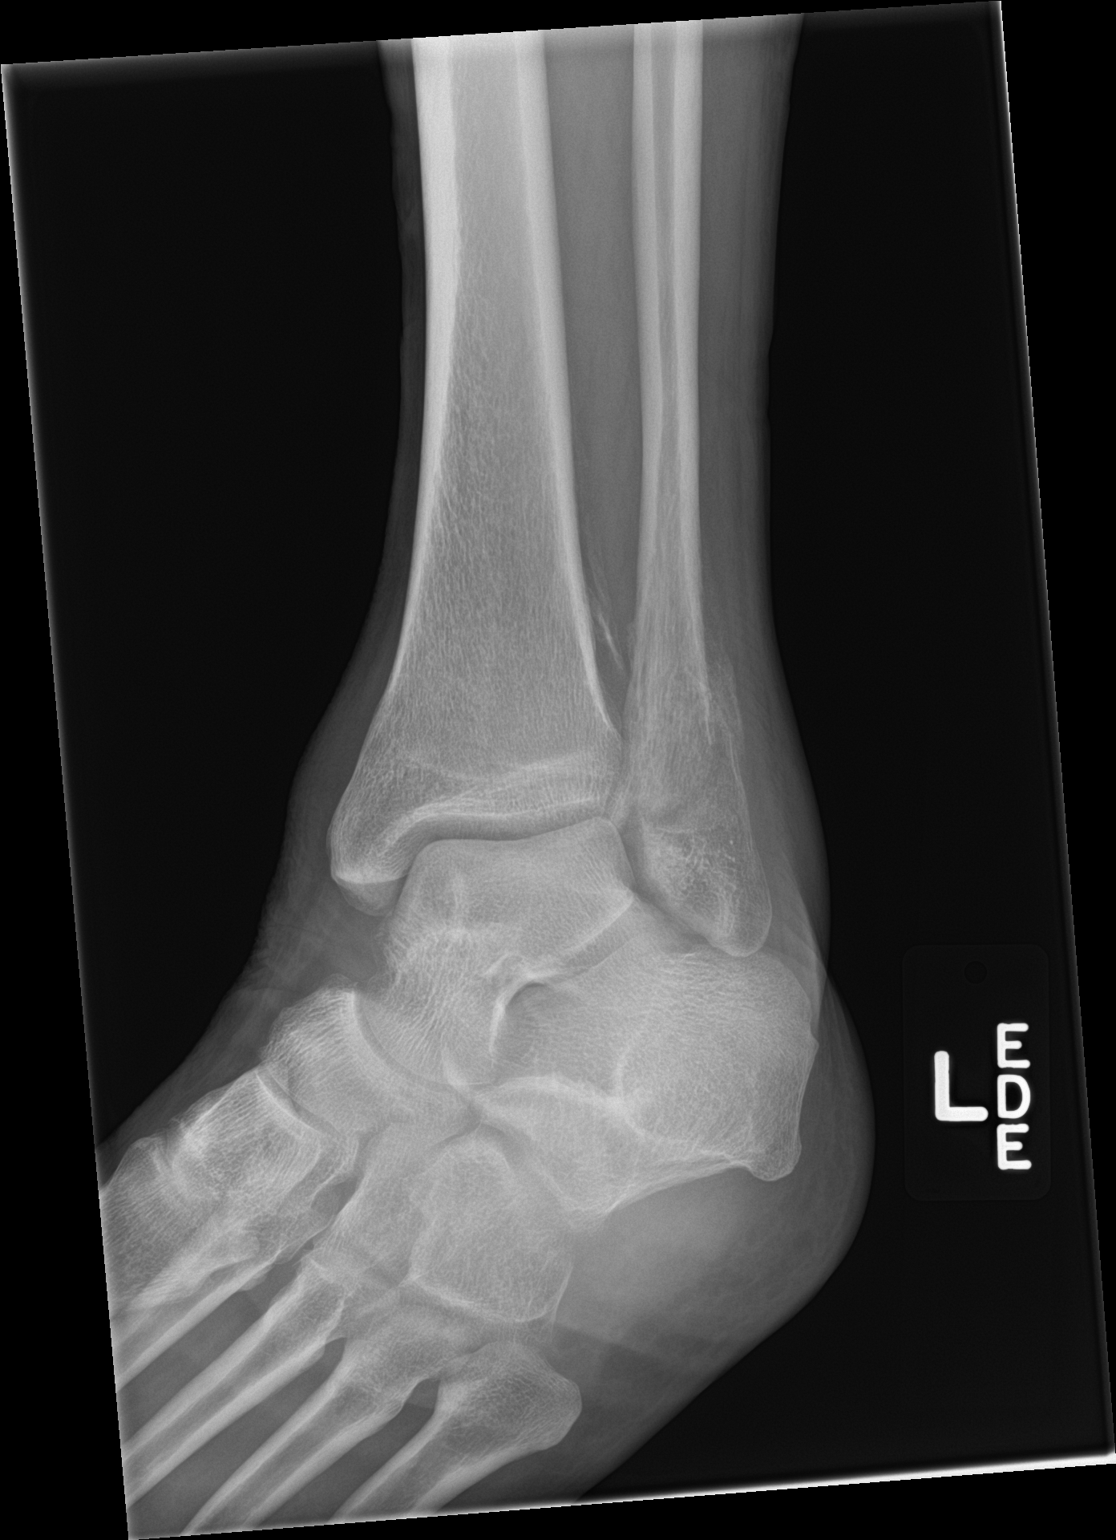

[ankle lat]
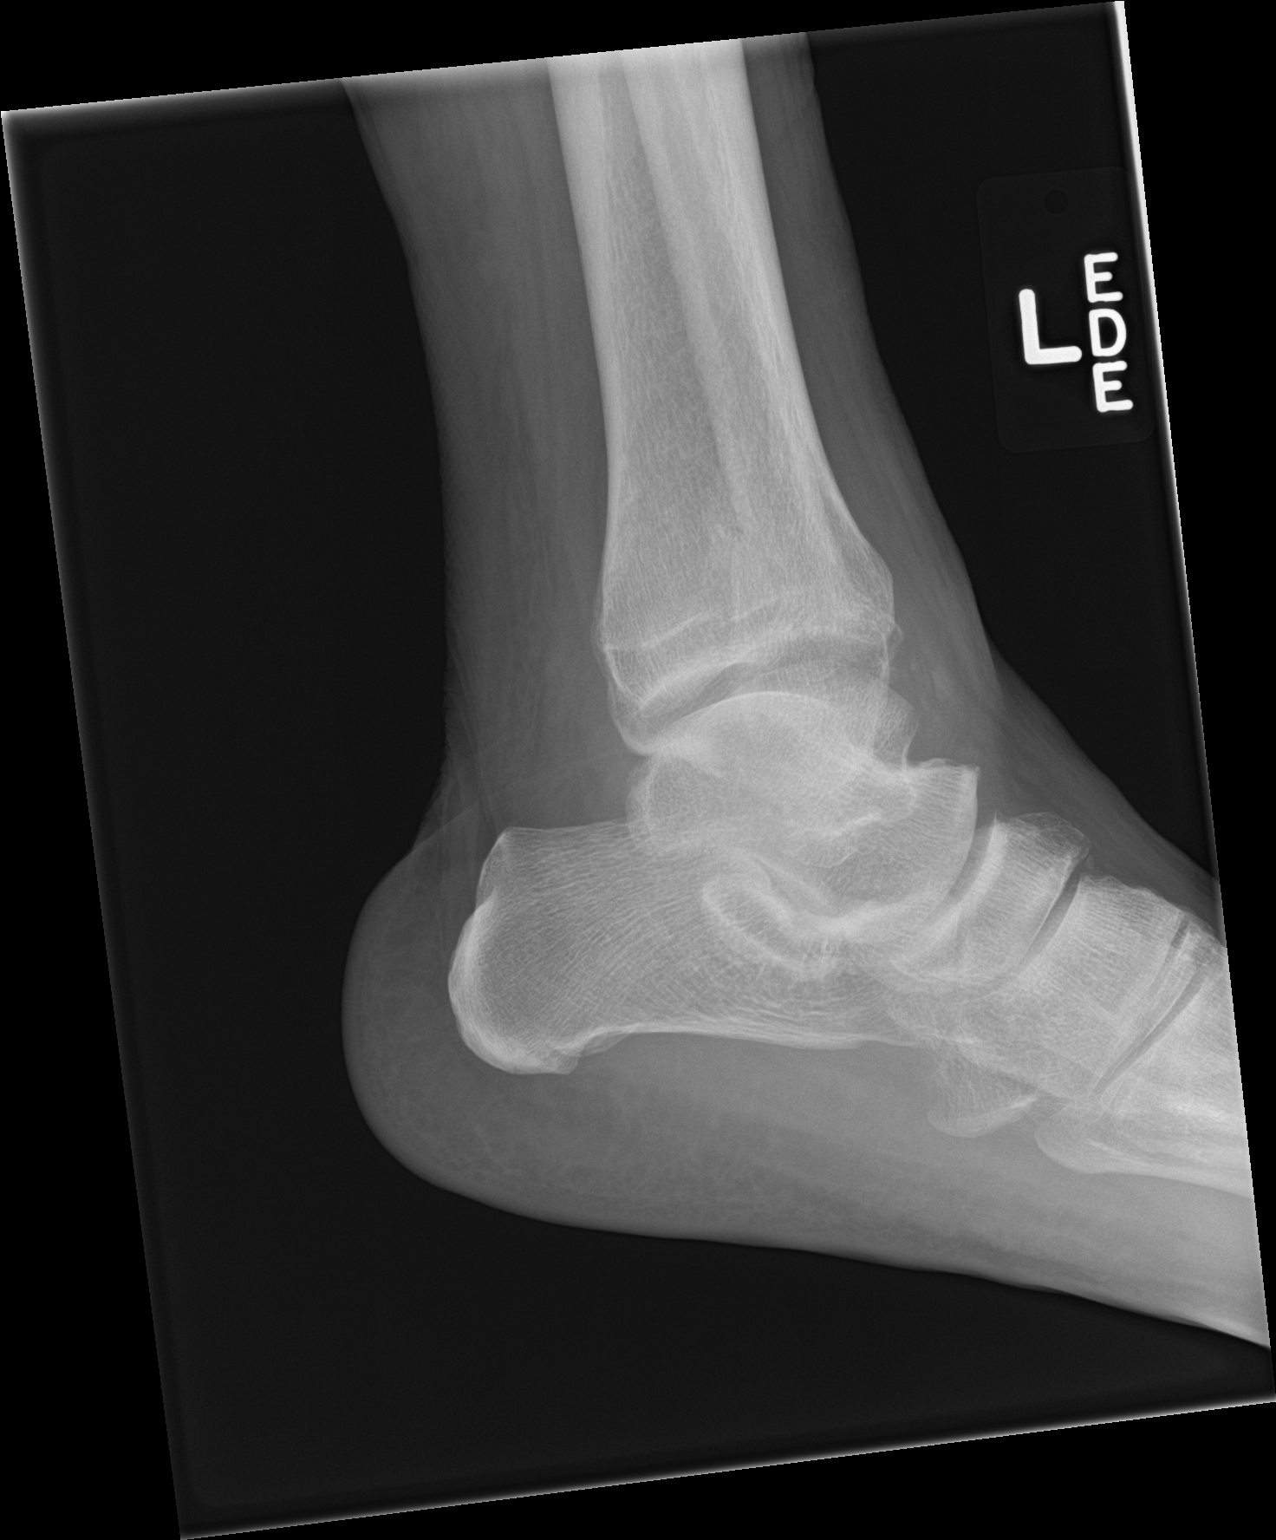

[3 of 3 positions shown; findings below may reference images not displayed]

FINDINGS: Mild regional soft tissue swelling.

Ankle mortise intact.

Bones appear demineralized.

Bony resorption and periosteal new bone identified at oblique
lateral malleolar fracture seen on previous exam.

No additional fracture, dislocation, or bone destruction.
IMPRESSION: Healing oblique lateral malleolar fracture LEFT ankle.

## 2016-09-19 IMAGING — DX DG ANKLE 2V *L*
2 series · 2 of 2 positions shown · non-contrast
Comparison: 10/24/2014

CLINICAL DATA: Tenderness left ankle. No injury. Ankle fracture
August 2014.

EXAM:
LEFT ANKLE - 2 VIEW

[ankle ap]
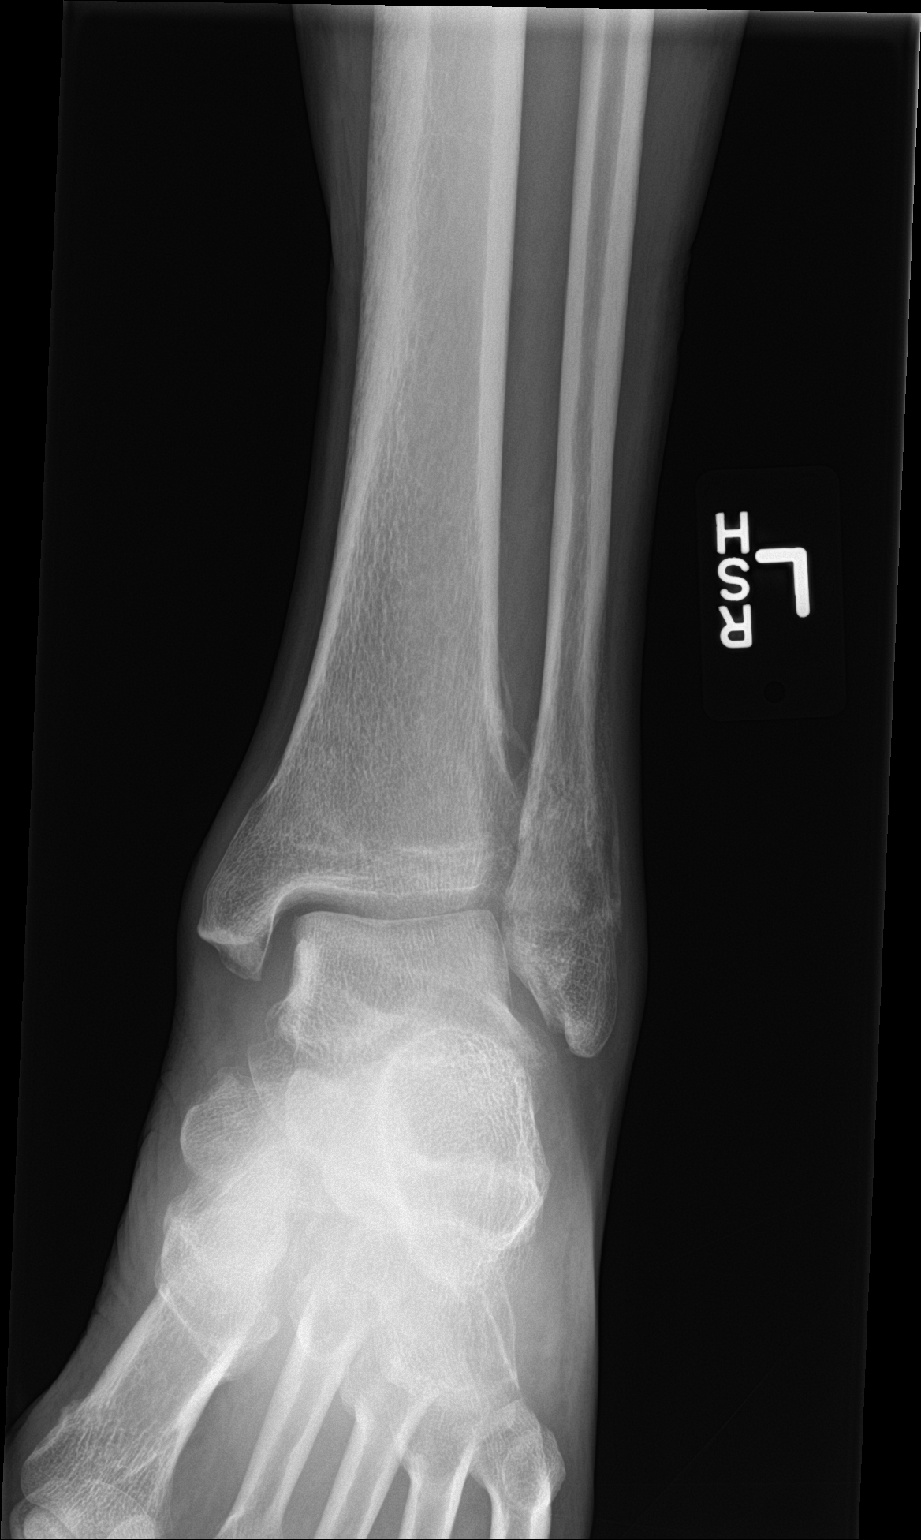

[ankle lat]
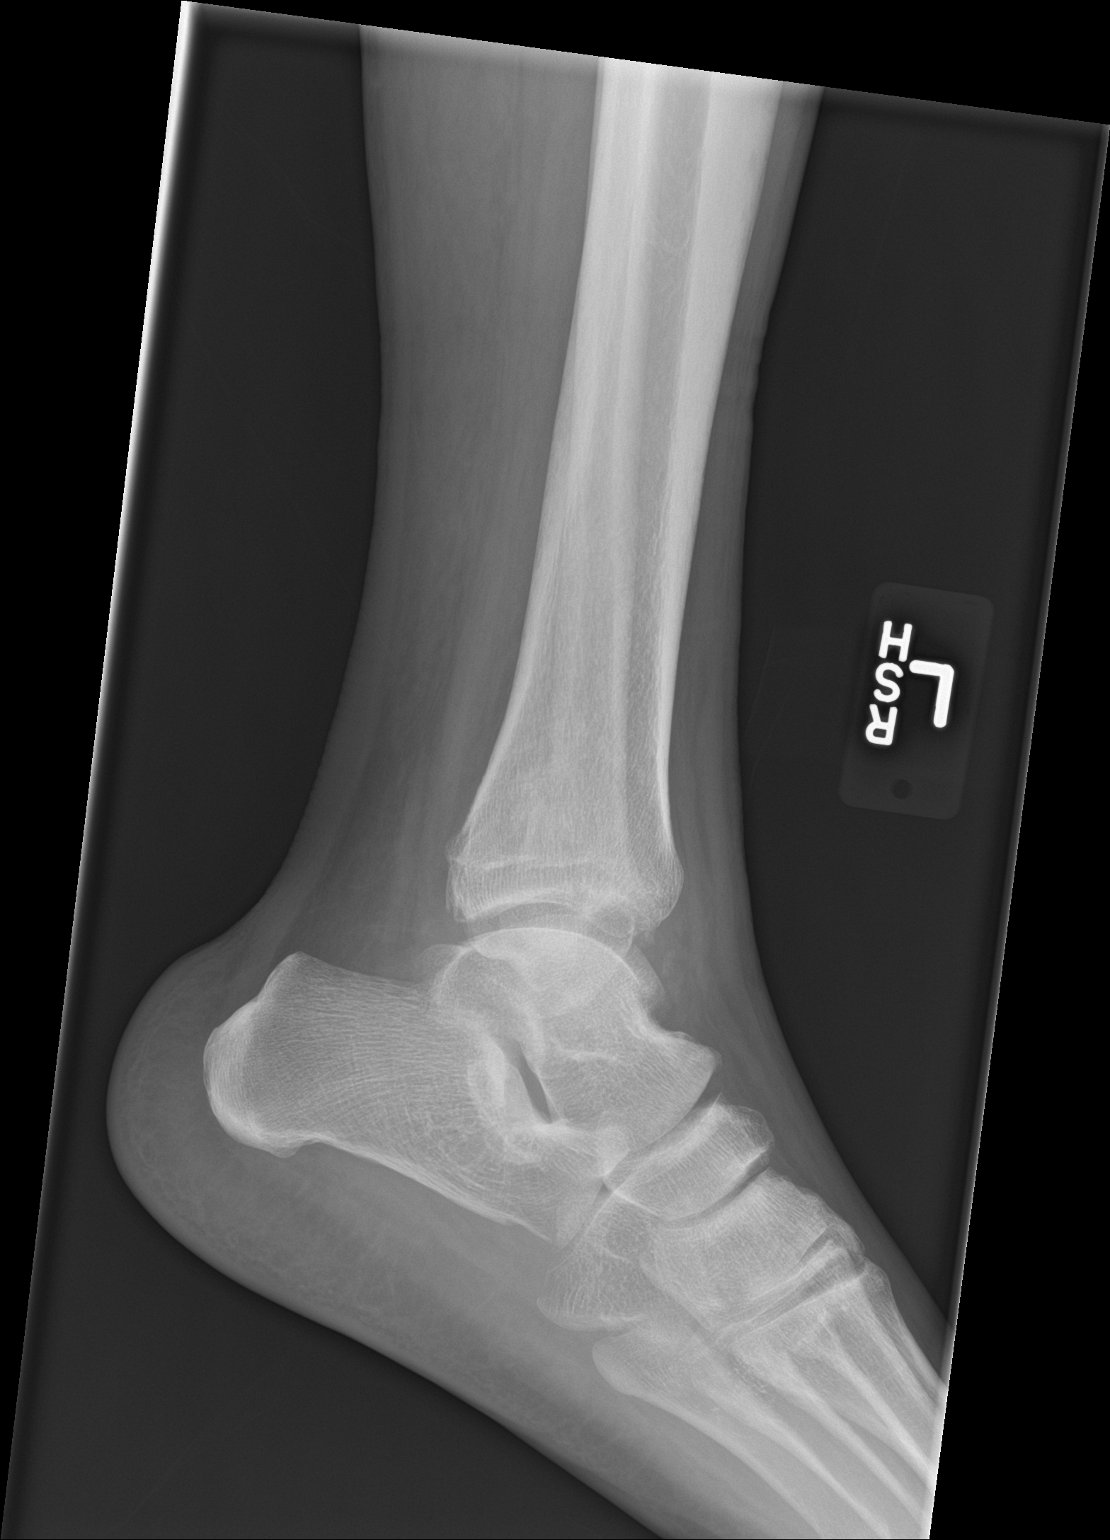

[2 of 2 positions shown; findings below may reference images not displayed]

FINDINGS: Examination demonstrates evidence of interval healing of patient's
left distal fibular fracture without significant residual lucency at
the fracture site. Ankle mortise is intact. Remainder of the exam is
unchanged.
IMPRESSION: Interval healing distal fibular fracture.  No acute injury.
